# Patient Record
Sex: Female | Born: 1965 | Race: Black or African American | Hispanic: No | Marital: Single | Smoking: Former smoker
Health system: Southern US, Community
[De-identification: ages and names within clinical notes are randomized; demographics above are authoritative.]

## PROBLEM LIST (undated history)

## (undated) DIAGNOSIS — E785 Hyperlipidemia, unspecified: Secondary | ICD-10-CM

## (undated) DIAGNOSIS — K219 Gastro-esophageal reflux disease without esophagitis: Secondary | ICD-10-CM

## (undated) DIAGNOSIS — N189 Chronic kidney disease, unspecified: Secondary | ICD-10-CM

## (undated) DIAGNOSIS — J309 Allergic rhinitis, unspecified: Secondary | ICD-10-CM

## (undated) DIAGNOSIS — D649 Anemia, unspecified: Secondary | ICD-10-CM

## (undated) DIAGNOSIS — J45909 Unspecified asthma, uncomplicated: Secondary | ICD-10-CM

## (undated) DIAGNOSIS — E049 Nontoxic goiter, unspecified: Secondary | ICD-10-CM

## (undated) DIAGNOSIS — I10 Essential (primary) hypertension: Secondary | ICD-10-CM

## (undated) DIAGNOSIS — D869 Sarcoidosis, unspecified: Secondary | ICD-10-CM

## (undated) DIAGNOSIS — F419 Anxiety disorder, unspecified: Secondary | ICD-10-CM

## (undated) DIAGNOSIS — F329 Major depressive disorder, single episode, unspecified: Secondary | ICD-10-CM

## (undated) DIAGNOSIS — M1990 Unspecified osteoarthritis, unspecified site: Secondary | ICD-10-CM

## (undated) DIAGNOSIS — I639 Cerebral infarction, unspecified: Secondary | ICD-10-CM

## (undated) DIAGNOSIS — G4733 Obstructive sleep apnea (adult) (pediatric): Secondary | ICD-10-CM

## (undated) DIAGNOSIS — F32A Depression, unspecified: Secondary | ICD-10-CM

## (undated) HISTORY — DX: Unspecified asthma, uncomplicated: J45.909

## (undated) HISTORY — DX: Anxiety disorder, unspecified: F41.9

## (undated) HISTORY — DX: Allergic rhinitis, unspecified: J30.9

## (undated) HISTORY — PX: ABDOMINAL HYSTERECTOMY: SHX81

## (undated) HISTORY — DX: Cerebral infarction, unspecified: I63.9

## (undated) HISTORY — DX: Sarcoidosis, unspecified: D86.9

## (undated) HISTORY — DX: Nontoxic goiter, unspecified: E04.9

## (undated) HISTORY — DX: Depression, unspecified: F32.A

## (undated) HISTORY — DX: Obstructive sleep apnea (adult) (pediatric): G47.33

## (undated) HISTORY — DX: Hyperlipidemia, unspecified: E78.5

## (undated) HISTORY — PX: OVARIAN CYST REMOVAL: SHX89

## (undated) HISTORY — DX: Essential (primary) hypertension: I10

## (undated) HISTORY — DX: Major depressive disorder, single episode, unspecified: F32.9

---

## 1999-04-13 ENCOUNTER — Other Ambulatory Visit: Admission: RE | Admit: 1999-04-13 | Discharge: 1999-04-13 | Payer: Self-pay | Admitting: *Deleted

## 2000-03-11 ENCOUNTER — Other Ambulatory Visit: Admission: RE | Admit: 2000-03-11 | Discharge: 2000-03-11 | Payer: Self-pay | Admitting: *Deleted

## 2000-03-11 ENCOUNTER — Encounter (INDEPENDENT_AMBULATORY_CARE_PROVIDER_SITE_OTHER): Payer: Self-pay

## 2000-04-27 ENCOUNTER — Other Ambulatory Visit: Admission: RE | Admit: 2000-04-27 | Discharge: 2000-04-27 | Payer: Self-pay | Admitting: *Deleted

## 2001-04-12 ENCOUNTER — Encounter: Payer: Self-pay | Admitting: *Deleted

## 2001-04-12 ENCOUNTER — Ambulatory Visit (HOSPITAL_COMMUNITY): Admission: RE | Admit: 2001-04-12 | Discharge: 2001-04-12 | Payer: Self-pay | Admitting: *Deleted

## 2001-05-01 ENCOUNTER — Other Ambulatory Visit: Admission: RE | Admit: 2001-05-01 | Discharge: 2001-05-01 | Payer: Self-pay | Admitting: *Deleted

## 2002-05-15 ENCOUNTER — Other Ambulatory Visit: Admission: RE | Admit: 2002-05-15 | Discharge: 2002-05-15 | Payer: Self-pay | Admitting: *Deleted

## 2003-06-05 ENCOUNTER — Other Ambulatory Visit: Admission: RE | Admit: 2003-06-05 | Discharge: 2003-06-05 | Payer: Self-pay | Admitting: *Deleted

## 2003-07-22 ENCOUNTER — Other Ambulatory Visit: Admission: RE | Admit: 2003-07-22 | Discharge: 2003-07-22 | Payer: Self-pay | Admitting: *Deleted

## 2004-01-25 ENCOUNTER — Other Ambulatory Visit: Admission: RE | Admit: 2004-01-25 | Discharge: 2004-01-25 | Payer: Self-pay | Admitting: *Deleted

## 2004-06-09 ENCOUNTER — Other Ambulatory Visit: Admission: RE | Admit: 2004-06-09 | Discharge: 2004-06-09 | Payer: Self-pay | Admitting: *Deleted

## 2005-01-14 ENCOUNTER — Other Ambulatory Visit: Admission: RE | Admit: 2005-01-14 | Discharge: 2005-01-14 | Payer: Self-pay | Admitting: *Deleted

## 2005-05-06 ENCOUNTER — Encounter: Admission: RE | Admit: 2005-05-06 | Discharge: 2005-05-06 | Payer: Self-pay | Admitting: Emergency Medicine

## 2005-05-17 ENCOUNTER — Other Ambulatory Visit: Admission: RE | Admit: 2005-05-17 | Discharge: 2005-05-17 | Payer: Self-pay | Admitting: *Deleted

## 2005-05-27 ENCOUNTER — Ambulatory Visit: Payer: Self-pay | Admitting: Pulmonary Disease

## 2005-06-10 ENCOUNTER — Ambulatory Visit: Admission: RE | Admit: 2005-06-10 | Discharge: 2005-06-10 | Payer: Self-pay | Admitting: Pulmonary Disease

## 2005-06-10 ENCOUNTER — Ambulatory Visit: Payer: Self-pay | Admitting: Pulmonary Disease

## 2005-06-15 ENCOUNTER — Ambulatory Visit (HOSPITAL_BASED_OUTPATIENT_CLINIC_OR_DEPARTMENT_OTHER): Admission: RE | Admit: 2005-06-15 | Discharge: 2005-06-15 | Payer: Self-pay | Admitting: Pulmonary Disease

## 2005-06-15 ENCOUNTER — Ambulatory Visit: Payer: Self-pay | Admitting: Pulmonary Disease

## 2005-07-23 ENCOUNTER — Encounter: Admission: RE | Admit: 2005-07-23 | Discharge: 2005-07-23 | Payer: Self-pay | Admitting: Rheumatology

## 2005-09-08 ENCOUNTER — Observation Stay (HOSPITAL_COMMUNITY): Admission: RE | Admit: 2005-09-08 | Discharge: 2005-09-09 | Payer: Self-pay | Admitting: *Deleted

## 2005-09-08 ENCOUNTER — Encounter (INDEPENDENT_AMBULATORY_CARE_PROVIDER_SITE_OTHER): Payer: Self-pay | Admitting: Specialist

## 2005-09-27 ENCOUNTER — Encounter: Admission: RE | Admit: 2005-09-27 | Discharge: 2005-09-27 | Payer: Self-pay | Admitting: Rheumatology

## 2005-10-21 ENCOUNTER — Ambulatory Visit (HOSPITAL_COMMUNITY): Admission: RE | Admit: 2005-10-21 | Discharge: 2005-10-21 | Payer: Self-pay | Admitting: Rheumatology

## 2005-12-07 ENCOUNTER — Ambulatory Visit: Payer: Self-pay | Admitting: Endocrinology

## 2005-12-13 ENCOUNTER — Encounter (INDEPENDENT_AMBULATORY_CARE_PROVIDER_SITE_OTHER): Payer: Self-pay | Admitting: *Deleted

## 2005-12-13 ENCOUNTER — Other Ambulatory Visit: Admission: RE | Admit: 2005-12-13 | Discharge: 2005-12-13 | Payer: Self-pay | Admitting: Interventional Radiology

## 2005-12-13 ENCOUNTER — Encounter: Admission: RE | Admit: 2005-12-13 | Discharge: 2005-12-13 | Payer: Self-pay | Admitting: Endocrinology

## 2006-05-19 ENCOUNTER — Other Ambulatory Visit: Admission: RE | Admit: 2006-05-19 | Discharge: 2006-05-19 | Payer: Self-pay | Admitting: *Deleted

## 2007-05-02 ENCOUNTER — Ambulatory Visit: Payer: Self-pay | Admitting: Pulmonary Disease

## 2007-05-19 ENCOUNTER — Ambulatory Visit: Payer: Self-pay | Admitting: Pulmonary Disease

## 2007-05-19 ENCOUNTER — Ambulatory Visit (HOSPITAL_BASED_OUTPATIENT_CLINIC_OR_DEPARTMENT_OTHER): Admission: RE | Admit: 2007-05-19 | Discharge: 2007-05-19 | Payer: Self-pay | Admitting: Pulmonary Disease

## 2007-06-07 ENCOUNTER — Other Ambulatory Visit: Admission: RE | Admit: 2007-06-07 | Discharge: 2007-06-07 | Payer: Self-pay | Admitting: *Deleted

## 2007-06-27 ENCOUNTER — Ambulatory Visit: Payer: Self-pay | Admitting: Pulmonary Disease

## 2007-07-20 ENCOUNTER — Encounter: Payer: Self-pay | Admitting: *Deleted

## 2007-07-20 DIAGNOSIS — E785 Hyperlipidemia, unspecified: Secondary | ICD-10-CM | POA: Insufficient documentation

## 2007-07-20 DIAGNOSIS — F329 Major depressive disorder, single episode, unspecified: Secondary | ICD-10-CM | POA: Insufficient documentation

## 2007-07-20 DIAGNOSIS — D860 Sarcoidosis of lung: Secondary | ICD-10-CM | POA: Insufficient documentation

## 2007-07-20 DIAGNOSIS — J309 Allergic rhinitis, unspecified: Secondary | ICD-10-CM | POA: Insufficient documentation

## 2007-07-20 DIAGNOSIS — F411 Generalized anxiety disorder: Secondary | ICD-10-CM | POA: Insufficient documentation

## 2007-07-20 DIAGNOSIS — F3289 Other specified depressive episodes: Secondary | ICD-10-CM | POA: Insufficient documentation

## 2007-07-20 DIAGNOSIS — I10 Essential (primary) hypertension: Secondary | ICD-10-CM | POA: Insufficient documentation

## 2007-11-22 ENCOUNTER — Encounter: Payer: Self-pay | Admitting: Pulmonary Disease

## 2008-01-10 ENCOUNTER — Ambulatory Visit: Payer: Self-pay | Admitting: Pulmonary Disease

## 2008-01-10 DIAGNOSIS — G4733 Obstructive sleep apnea (adult) (pediatric): Secondary | ICD-10-CM | POA: Insufficient documentation

## 2008-07-22 ENCOUNTER — Other Ambulatory Visit: Admission: RE | Admit: 2008-07-22 | Discharge: 2008-07-22 | Payer: Self-pay | Admitting: Emergency Medicine

## 2009-06-06 ENCOUNTER — Ambulatory Visit: Payer: Self-pay | Admitting: Pulmonary Disease

## 2009-06-11 ENCOUNTER — Ambulatory Visit: Payer: Self-pay | Admitting: Internal Medicine

## 2009-06-19 ENCOUNTER — Ambulatory Visit: Payer: Self-pay | Admitting: Pulmonary Disease

## 2009-06-23 ENCOUNTER — Encounter: Payer: Self-pay | Admitting: Pulmonary Disease

## 2009-06-24 ENCOUNTER — Ambulatory Visit: Payer: Self-pay | Admitting: Pulmonary Disease

## 2009-06-24 DIAGNOSIS — E049 Nontoxic goiter, unspecified: Secondary | ICD-10-CM | POA: Insufficient documentation

## 2009-06-24 DIAGNOSIS — R0602 Shortness of breath: Secondary | ICD-10-CM | POA: Insufficient documentation

## 2009-08-07 ENCOUNTER — Ambulatory Visit: Payer: Self-pay | Admitting: Pulmonary Disease

## 2009-10-15 ENCOUNTER — Encounter: Payer: Self-pay | Admitting: Pulmonary Disease

## 2009-11-19 ENCOUNTER — Encounter: Payer: Self-pay | Admitting: Pulmonary Disease

## 2010-03-13 ENCOUNTER — Encounter: Payer: Self-pay | Admitting: Pulmonary Disease

## 2010-03-18 ENCOUNTER — Ambulatory Visit (HOSPITAL_COMMUNITY): Admission: RE | Admit: 2010-03-18 | Discharge: 2010-03-18 | Payer: Self-pay | Admitting: Rheumatology

## 2010-07-28 ENCOUNTER — Encounter: Payer: Self-pay | Admitting: Pulmonary Disease

## 2010-11-10 NOTE — Letter (Signed)
Summary: Stacey Drain MD  Stacey Drain MD   Imported By: Sherian Rein 11/29/2009 11:22:00  _____________________________________________________________________  External Attachment:    Type:   Image     Comment:   External Document

## 2010-11-10 NOTE — Assessment & Plan Note (Signed)
Summary: 2wk rov   Copy to:  Stacey Drain Primary Provider/Referring Provider:  Lajean Manes  CC:  2 wk follow up after CT and PFTs.  Pt states cpap is doing fine, wears it every night for 8-12 hours, and mask is fitting fine and pressure is doing well.  Pt states breathing is doing better since started on xopenex.  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  She has been feeling better since she started using xopenex.  She has been using this about once per day.  Recent PFT's showed mild restriction, mild diffusion capacity, and truncation of inspiratory limb of the flow volume loop.      CT of Chest  Procedure date:  06/11/2009  Findings:      CT Chest Impression   1.  No acute findings in the chest.  2.  Borderline enlarged mediastinal and hilar lymph nodes are consistent with the given history of sarcoid.   CT Neck Impression:  1.   Enlarged thyroid gland with dominant mass on the left.  Ultrasound recommended for further delineation.  This causes slight impression upon left lateral aspect of the trachea which is displaced minimally toward the right.  2.  Epiglottis and aryepiglottic folds top normal to minimally prominent in size.  Significance/etiology indeterminate.  3.   Polypoid opacification inferior aspect maxillary sinuses greater on the right   Current Medications (verified): 1)  Hydrochlorothiazide 12.5 Mg Caps (Hydrochlorothiazide) .Marland Kitchen.. 1 By Mouth Daily 2)  Zocor 40 Mg  Tabs (Simvastatin) .... Take 1 By Mouth Qd 3)  Lexapro 10 Mg  Tabs (Escitalopram Oxalate) .... Take 1 By Mouth Qd 4)  Wellbutrin Xl 300 Mg  Tb24 (Bupropion Hcl) .... Take 1 By Mouth Qd 5)  Methotrexate 2.5 Mg  Tabs (Methotrexate Sodium) .... 2 Times Weeklyu On Tues and Weds Weekly 6)  Cvs Folic Acid 400 Mcg  Tabs (Folic Acid) .... Once Daily 7)  Ferro-Bob 325 (65 Fe) Mg  Tabs (Ferrous Sulfate) ....  Two Times A Day 8)  Acid Adophilus .... 2 Tabs Daily 9)  Xopenex Hfa 45 Mcg/act Aero (Levalbuterol Tartrate) .... Two Puffs Up To Four Times Per Day As Needed  Allergies (verified): 1)  ! Ibuprofen  Past History:  Past Medical History: Allergic rhinitis Anxiety Depression Hyperlipidemia Hypertension Sarcoidosis (pulmonary and dermatologic) OSA      - PSG 06/15/05 AHI 28      - CPAP 11 cm H2O Dyspnea with probable asthma      - PFT 06/19/09 FEV1 1.78(62%), FVC 2.51(68%), FEV1% 71, TLC 4.03(74%), DLCO 65%, no BD  Past Surgical History: Reviewed history from 07/20/2007 and no changes required. Hysterectomy  Family History: Reviewed history from 06/06/2009 and no changes required. Mother - heart disease Brother - Sarcoid Niece - Sarcoid  Social History: Reviewed history from 06/06/2009 and no changes required. Single.  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  Vital Signs:  Patient profile:   45 year old female Height:      66 inches Weight:      303.38 pounds BMI:     49.14 O2 Sat:      97 % on Room air Temp:     98.4 degrees F oral Pulse rate:   81 / minute BP sitting:   116 / 70  (left arm) Cuff size:   large  Vitals Entered By: Gweneth Dimitri RN (June 24, 2009 2:40 PM)  O2 Flow:  Room air CC: 2 wk follow up after CT and PFTs.  Pt states cpap is doing fine, wears it every night for 8-12 hours, mask is fitting fine and pressure is doing well.  Pt states breathing is doing better since started on xopenex.   Comments Medications reviewed with patient Gweneth Dimitri RN  June 24, 2009 2:39 PM    Physical Exam  General:  obese. Nose:  nodule left alar region Mouth:  MP 3, no oral lesions Neck:  JVD and enlarged thyroid.   Lungs:  diminished breath sounds, no wheeze, no rales Heart:  regular rate and rhythm, S1, S2 without murmurs, rubs, gallops, or clicks Abdomen:  obese, soft, non-tender Extremities:  no edema Cervical Nodes:  no significant  adenopathy   Impression & Recommendations:  Problem # 1:  DYSPNEA (ICD-786.05) Her current symptoms are more suggestive of asthma, and she did have a clinical response to bronchodilator therapy.  I will start her on Qvar and continue as needed xopenex.  Her CT chest was relatively unremarkable for active sarcoid.  In addition, I think her PFT findings of mild restriction and diffusion defect could also be explained by her obesity.  She likely has a component of deconditioning.  Problem # 2:  SLEEP APNEA, OBSTRUCTIVE (ICD-327.23) She is to continue on CPAP.  Problem # 3:  SARCOIDOSIS (ICD-135) She is to continue on methotrexate.  I don't think she has active pulmonary involvement from her sarcoid.  Problem # 4:  ALLERGIC RHINITIS (ICD-477.9) She is to continue on symptomatic therapy.  Problem # 5:  GOITER, UNSPECIFIED (ICD-240.9) She has a history of goiter.  She did have some mild truncation of the inspiratory limb of her flow volume curve on recent PFT.  In addition she had tracheal deviation on recent CT neck.  She would like to see if she continues to improve with asthma therapy first.  If she remains symptomatic with dyspnea, then she may need ENT evaluation of her thyroid.  Medications Added to Medication List This Visit: 1)  Qvar 40 Mcg/act Aers (Beclomethasone dipropionate) .... Two puffs two times a day  Complete Medication List: 1)  Xopenex Hfa 45 Mcg/act Aero (Levalbuterol tartrate) .... Two puffs up to four times per day as needed 2)  Hydrochlorothiazide 12.5 Mg Caps (Hydrochlorothiazide) .Marland Kitchen.. 1 by mouth daily 3)  Zocor 40 Mg Tabs (Simvastatin) .... Take 1 by mouth qd 4)  Lexapro 10 Mg Tabs (Escitalopram oxalate) .... Take 1 by mouth qd 5)  Wellbutrin Xl 300 Mg Tb24 (Bupropion hcl) .... Take 1 by mouth qd 6)  Methotrexate 2.5 Mg Tabs (Methotrexate sodium) .... 2 times weeklyu on tues and weds weekly 7)  Cvs Folic Acid 400 Mcg Tabs (Folic acid) .... Once daily 8)   Ferro-bob 325 (65 Fe) Mg Tabs (Ferrous sulfate) .... Two times a day 9)  Acid Adophilus  .... 2 tabs daily 10)  Qvar 40 Mcg/act Aers (Beclomethasone dipropionate) .... Two puffs two times a day  Other Orders: Est. Patient Level III (22025)  Patient Instructions: 1)  Qvar two puffs two times a day, and rinse mouth after using 2)  Continue xopenex two puffs as needed 3)  Follow up in 3 to 4 weeks Prescriptions: QVAR 40 MCG/ACT AERS (BECLOMETHASONE DIPROPIONATE) two puffs two times a day  #1 x 3   Entered and Authorized by:   Coralyn Helling MD   Signed by:   Coralyn Helling MD on 06/24/2009  Method used:   Electronically to        [facility] [address] [phone]* (retail)       [address]       [place], [place]  [address]       Ph: [phone] or [phone]       Fax: [phone]   RxID:   [id number]

## 2010-11-10 NOTE — Assessment & Plan Note (Signed)
Summary: fu////kp   Referred by:  Stacey Drain PCP:  Lajean Manes  Chief Complaint:  follow up - no complaints.  History of Present Illness: I saw [name] in follow up for her moderate OSA on CPAP 7 and Sarcoidosis.  She is treated by Dr. Kellie Simmering for her sarcoid.  She uses a full face mask and humidifer for her CPAP.  She does have dryness in her nose and mouth.  She has not adjusted the temperature setting on her humidifer.  She goes to bed at 1030 and wakes up at 630.  She does not need to use the bathroom as much at night now.  She feels like she is getting more sleepy during the day compared to when she first started using CPAP.      Current Allergies: ! IBUPROFEN      Vital Signs:  Patient Profile:   45 Years Old Female Height:     66 inches Weight:      274.38 pounds BMI:     44.45 O2 Sat:      100 % O2 treatment:    Room Air Temp:     98.8 degrees F oral Pulse rate:   78 / minute BP sitting:   140 / 80  (right arm) Cuff size:   regular  Pt. in pain?   no  Vitals Entered By: Marinus Maw (January 10, 2008 4:10 PM)              Comments Medications reviewed with patient ..................................................................Marland KitchenMarinus Maw  January 10, 2008 4:10 PM       Physical Exam  Nose:     no sinus tenderness Mouth:     no oral lesion Lungs:     no wheeze Heart:     regular rhythm and normal rate.   Abdomen:     soft Extremities:     no edema     Impression & Recommendations:  Problem # 1:  SLEEP APNEA, OBSTRUCTIVE (ICD-327.23) I will arrange for her to have an auto-CPAP titration for two weeks to see if any further adjustments needed to be made in her CPAP pressure.  Problem # 2:  SARCOIDOSIS (ICD-135) She is followed by Dr. Kellie Simmering and dermatology for this.   Medications Added to Medication List This Visit: 1)  Cvs Folic Acid 400 Mcg Tabs (Folic acid) .... Once daily 2)  Ferro-bob 325 (65 Fe) Mg Tabs  (Ferrous sulfate) .... Two times a day 3)  Gnp Flax Seed Oil 1000 Mg Caps (Flaxseed (linseed)) .... Once daily 4)  Antioxidant A/c/e Tabs (Multiple vitamin) .... Qd 5)  Methotrexate 2.5 Mg Tabs (Methotrexate sodium) .... 2 times weeklyu on tues and weds weekly 6)  Acid Adophilus  .... 2 tabs daily   Patient Instructions: 1)  Will arrange for sleep test at home (auto-CPAP titration) 2)  Follow up in 6 months    ]  Appended Document: fu////kp      Referred by:  Stacey Drain PCP:  Lajean Manes   History of Present Illness: I reviewed her auto CPAP report from 01/16/08 to 01/29/08.  She had 86% of days with machine use, and an average of 7 hrs 22 min.  Her optimal pressure was 11 cm H2O with an AHI of 3.1.    Current Allergies: ! IBUPROFEN        Impression & Recommendations:  Problem # 1:  SLEEP APNEA, OBSTRUCTIVE (ICD-327.23) Will change CPAP pressure to 11 cm H2O. Orders:  DME Referral (DME)      ]

## 2010-11-10 NOTE — Assessment & Plan Note (Signed)
Summary: rov/apc   Copy to:  Stacey Drain Primary Provider/Referring Provider:  Lajean Manes  CC:  OSA follow-up.   Pt says she is doing well on CPAP.Marland Kitchen  History of Present Illness: 45 yo female with Sarcoidosis with pulmonary and skin involvement, and moderate OSA on CPAP 11 cm.  She has been feeling more short of breath over the past 2 weeks.  She has been getting a cough with clear sputum.  She has been getting chest tightness and wheezing.  Her sinuses have been ok.  She has been using her sinus regimen.  She denies fever, sweats, chest pain, abdominal pain, leg swelling, or gland swelling.  She has been doing well with CPAP.  She is using a full face mask, and has no problem with her mask.    Chest xray today shows mild hilar prominence, but otherwise unremarkable.  Spirometry today was normal, but did show some truncation in the flow volume loop.    Current Medications (verified): 1)  Hydrochlorothiazide 12.5 Mg Caps (Hydrochlorothiazide) .Marland Kitchen.. 1 By Mouth Daily 2)  Lexapro 10 Mg  Tabs (Escitalopram Oxalate) .... Take 1 By Mouth Qd 3)  Wellbutrin Xl 300 Mg  Tb24 (Bupropion Hcl) .... Take 1 By Mouth Qd 4)  Zocor 40 Mg  Tabs (Simvastatin) .... Take 1 By Mouth Qd 5)  Cvs Folic Acid 400 Mcg  Tabs (Folic Acid) .... Once Daily 6)  Ferro-Bob 325 (65 Fe) Mg  Tabs (Ferrous Sulfate) .... Two Times A Day 7)  Methotrexate 2.5 Mg  Tabs (Methotrexate Sodium) .... 2 Times Weeklyu On Tues and Weds Weekly 8)  Acid Adophilus .... 2 Tabs Daily  Allergies (verified): 1)  ! Ibuprofen  Past History:  Past Medical History: Allergic rhinitis Anxiety Depression Hyperlipidemia Hypertension Sarcoidosis (pulmonary and dermatologic) OSA      - PSG 06/15/05 AHI 28      - CPAP 11 cm H2O  Family History: Mother - heart disease Brother - Sarcoid Niece - Sarcoid  Social History: Single.  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  Vital Signs:  Patient profile:   45 year old  female Height:      66 inches (167.64 cm) Weight:      298.50 pounds (135.68 kg) BMI:     48.35 O2 Sat:      96 % on Room air Temp:     98.6 degrees F (37.00 degrees C) oral Pulse rate:   108 / minute BP sitting:   126 / 70  (right arm) Cuff size:   large  Vitals Entered By: Michel Bickers CMA (June 06, 2009 3:08 PM)  O2 Flow:  Room air  Physical Exam  General:  obese. Eyes:  PERRLA and EOMI.   Nose:  nodule left alar region Mouth:  MP 3, no oral lesions Neck:  no JVD.  no JVD.   Lungs:  diminished breath sounds, faint expiratory wheeze, no rales Heart:  regular rate and rhythm, S1, S2 without murmurs, rubs, gallops, or clicks Abdomen:  obese, soft, non-tender Extremities:  no edema Cervical Nodes:  no significant adenopathy   Impression & Recommendations:  Problem # 1:  SARCOIDOSIS (ICD-135) She has recent worsening of her respiratory symptoms.  Her chest xray today was relatively unremarkable, but did show mild hilar prominence.  To further assess this I will set her up for a CT chest and neck with contrast.  I will also set up full PFT's.  I will give her an empiric trial  of xopenex.  Problem # 2:  SLEEP APNEA, OBSTRUCTIVE (ICD-327.23) She is doing well on CPAP.  Medications Added to Medication List This Visit: 1)  Hydrochlorothiazide 12.5 Mg Caps (Hydrochlorothiazide) .Marland Kitchen.. 1 by mouth daily 2)  Xopenex Hfa 45 Mcg/act Aero (Levalbuterol tartrate) .... Two puffs up to four times per day as needed  Complete Medication List: 1)  Hydrochlorothiazide 12.5 Mg Caps (Hydrochlorothiazide) .Marland Kitchen.. 1 by mouth daily 2)  Zocor 40 Mg Tabs (Simvastatin) .... Take 1 by mouth qd 3)  Lexapro 10 Mg Tabs (Escitalopram oxalate) .... Take 1 by mouth qd 4)  Wellbutrin Xl 300 Mg Tb24 (Bupropion hcl) .... Take 1 by mouth qd 5)  Methotrexate 2.5 Mg Tabs (Methotrexate sodium) .... 2 times weeklyu on tues and weds weekly 6)  Cvs Folic Acid 400 Mcg Tabs (Folic acid) .... Once daily 7)  Ferro-bob 325  (65 Fe) Mg Tabs (Ferrous sulfate) .... Two times a day 8)  Acid Adophilus  .... 2 tabs daily 9)  Xopenex Hfa 45 Mcg/act Aero (Levalbuterol tartrate) .... Two puffs up to four times per day as needed  Other Orders: Est. Patient Level III (16109) Spirometry w/Graph (94010) Full Pulmonary Function Test (PFT) Radiology Referral (Radiology) T-2 View CXR (71020TC)  Patient Instructions: 1)  Xopenex two puffs up to four times per day as needed 2)  Will schedule breathing test (PFT) 3)  Will schedule CT neck and chest 4)  Follow up in 2 weeks

## 2010-11-10 NOTE — Miscellaneous (Signed)
Summary: Cancelled appt   Clinical Lists Changes Pt cancelled appt w/ Dr. Craige Cotta on 11-22-07 and did not [facility]...................................................................Marland KitchenMichel Bickers [facility]  November 22, 2007 12:35 PM

## 2010-11-10 NOTE — Miscellaneous (Signed)
Summary: Orders Update pft charges  Clinical Lists Changes  Orders: Added new Service order of Carbon Monoxide diffusing w/capacity (94720) - Signed Added new Service order of Lung Volumes (94240) - Signed Added new Service order of Spirometry (Pre & Post) (94060) - Signed 

## 2010-11-10 NOTE — Miscellaneous (Signed)
Summary: Pulmonary function test   Pulmonary Function Test Date: 06/19/2009 Height (in.): 66 Gender: Female  Pre-Spirometry FVC    Value: 2.51 L/min   Pred: 3.68 L/min     % Pred: 68 % FEV1    Value: 1.78 L     Pred: 2.84 L     % Pred: 63 % FEV1/FVC  Value: 71 %     Pred: 76 %     % Pred: . % FEF 25-75  Value: 1.28 L/min   Pred: 3.19 L/min     % Pred: 40 %  Post-Spirometry FVC    Value: 2.48 L/min   Pred: 3.68 L/min     % Pred: 67 % FEV1    Value: 1.77 L     Pred: 2.84 L     % Pred: 62 % FEV1/FVC  Value: 72 %     Pred: 76 %     % Pred: . % FEF 25-75  Value: 1.28 L/min   Pred: 3.19 L/min     % Pred: 40 %  Lung Volumes TLC    Value: 4.03 L   % Pred: 79 % RV    Value: 1.48 L   % Pred: 79 % DLCO    Value: 20.8 %   % Pred: 65 % DLCO/VA  Value: 6.01  %   % Pred: 146 %  Comments: No obstruction.  No bronchodilator response.  Mild restriction.  Mild diffusion defect corrects for lung volumes.  Has truncation of inspiratory limb of flow volume loop.  Clinical Lists Changes  Observations: Added new observation of PFT COMMENTS: No obstruction.  No bronchodilator response.  Mild restriction.  Mild diffusion defect corrects for lung volumes.  Has truncation of inspiratory limb of flow volume loop. (06/19/2009 8:41) Added new observation of DLCO/VA%EXP: 146 % (06/19/2009 8:41) Added new observation of DLCO/VA: 6.01  % (06/19/2009 8:41) Added new observation of DLCO % EXPEC: 65 % (06/19/2009 8:41) Added new observation of DLCO: 20.8 % (06/19/2009 8:41) Added new observation of RV % EXPECT: 79 % (06/19/2009 8:41) Added new observation of RV: 1.48 L (06/19/2009 8:41) Added new observation of TLC % EXPECT: 79 % (06/19/2009 8:41) Added new observation of TLC: 4.03 L (06/19/2009 8:41) Added new observation of FEF2575%EXPS: 40 % (06/19/2009 8:41) Added new observation of PSTFEF25/75P: 3.19  (06/19/2009 8:41) Added new observation of PSTFEF25/75%: 1.28 L/min (06/19/2009 8:41) Added new  observation of PSTFEV1/FCV%: . % (06/19/2009 8:41) Added new observation of FEV1FVCPRDPS: 76 % (06/19/2009 8:41) Added new observation of PSTFEV1/FVC: 72 % (06/19/2009 8:41) Added new observation of POSTFEV1%PRD: 62 % (06/19/2009 8:41) Added new observation of FEV1PRDPST: 2.84 L (06/19/2009 8:41) Added new observation of POST FEV1: 1.77 L/min (06/19/2009 8:41) Added new observation of POST FVC%EXP: 67 % (06/19/2009 8:41) Added new observation of FVCPRDPST: 3.68 L/min (06/19/2009 8:41) Added new observation of POST FVC: 2.48 L (06/19/2009 8:41) Added new observation of FEF % EXPEC: 40 % (06/19/2009 8:41) Added new observation of FEF25-75%PRE: 3.19 L/min (06/19/2009 8:41) Added new observation of FEF 25-75%: 1.28 L/min (06/19/2009 8:41) Added new observation of FEV1/FVC%EXP: . % (06/19/2009 8:41) Added new observation of FEV1/FVC PRE: 76 % (06/19/2009 8:41) Added new observation of FEV1/FVC: 71 % (06/19/2009 8:41) Added new observation of FEV1 % EXP: 63 % (06/19/2009 8:41) Added new observation of FEV1 PREDICT: 2.84 L (06/19/2009 8:41) Added new observation of FEV1: 1.78 L (06/19/2009 8:41) Added new observation of FVC % EXPECT: 68 % (06/19/2009 8:41) Added new observation of  FVC PREDICT: 3.68 L (06/19/2009 8:41) Added new observation of FVC: 2.51 L (06/19/2009 8:41) Added new observation of PFT HEIGHT: 66  (06/19/2009 8:41) Added new observation of PFT DATE: 06/19/2009  (06/19/2009 8:41)

## 2010-11-10 NOTE — Letter (Signed)
Summary: Stacey Drain MD  Stacey Drain MD   Imported By: Sherian Rein 11/11/2009 09:13:50  _____________________________________________________________________  External Attachment:    Type:   Image     Comment:   External Document

## 2010-11-10 NOTE — Assessment & Plan Note (Signed)
Summary: 4 weeks/ mbw   Copy to:  Stacey Drain Primary Provider/Referring Provider:  Lajean Manes  CC:  3-4 week dyspnea follow-up.  Pt says her breathing is 95% better on the Qvar.Marland Kitchen  History of Present Illness: 46 yo female with Sarcoidosis with pulmonary and skin involvement, and moderate OSA on CPAP 11 cm.  Her breathing has improved with Qvar.  She still has an occasional dry cough.  However, she is still having some sinus congestion and post-nasal drip.  She denies wheeze or fever.  She has not been using her xopenex much.  Her methotrexate dose was decreased to 6 pills per week because her liver enzymes were elevated.  She also had a decrease in the dose of her zocor.  She is not having any problems with her CPAP.   Current Medications (verified): 1)  Xopenex Hfa 45 Mcg/act Aero (Levalbuterol Tartrate) .... Two Puffs Up To Four Times Per Day As Needed 2)  Hydrochlorothiazide 12.5 Mg Caps (Hydrochlorothiazide) .Marland Kitchen.. 1 By Mouth Daily 3)  Zocor 40 Mg  Tabs (Simvastatin) .... Take 1 By Mouth Qd 4)  Lexapro 10 Mg  Tabs (Escitalopram Oxalate) .... Take 1 By Mouth Qd 5)  Wellbutrin Xl 300 Mg  Tb24 (Bupropion Hcl) .... Take 1 By Mouth Qd 6)  Methotrexate 2.5 Mg  Tabs (Methotrexate Sodium) .... 2 Times Weeklyu On Tues and Weds Weekly 7)  Cvs Folic Acid 400 Mcg  Tabs (Folic Acid) .... Once Daily 8)  Ferro-Bob 325 (65 Fe) Mg  Tabs (Ferrous Sulfate) .... Two Times A Day 9)  Acid Adophilus .... 2 Tabs Daily 10)  Qvar 40 Mcg/act Aers (Beclomethasone Dipropionate) .... Two Puffs Two Times A Day 11)  Flonase 50 Mcg/act Susp (Fluticasone Propionate) .... 2 Sprays in Each Nostril Daily  Allergies (verified): 1)  ! Ibuprofen  Past History:  Past Medical History: Reviewed history from 06/24/2009 and no changes required. Allergic rhinitis Anxiety Depression Hyperlipidemia Hypertension Sarcoidosis (pulmonary and dermatologic) OSA      - PSG 06/15/05 AHI 28      - CPAP 11 cm H2O Dyspnea  with probable asthma      - PFT 06/19/09 FEV1 1.78(62%), FVC 2.51(68%), FEV1% 71, TLC 4.03(74%), DLCO 65%, no BD  Past Surgical History: Reviewed history from 07/20/2007 and no changes required. Hysterectomy  Family History: Reviewed history from 06/06/2009 and no changes required. Mother - heart disease Brother - Sarcoid Niece - Sarcoid  Social History: Reviewed history from 06/06/2009 and no changes required. Single.  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  Vital Signs:  Patient profile:   45 year old female Height:      66 inches (167.64 cm) Weight:      291.50 pounds (132.50 kg) BMI:     47.22 O2 Sat:      97 % on Room air Temp:     98.5 degrees F (36.94 degrees C) oral Pulse rate:   84 / minute BP sitting:   124 / 78  (right arm) Cuff size:   large  Vitals Entered By: Michel Bickers CMA (August 07, 2009 3:01 PM)  O2 Flow:  Room air  Physical Exam  General:  obese. Nose:  nodule left alar region with some improvement  Mouth:  MP 3, no oral lesions Neck:  JVD and enlarged thyroid.   Lungs:  diminished breath sounds, no wheeze, no rales Heart:  regular rate and rhythm, S1, S2 without murmurs, rubs, gallops, or clicks Abdomen:  obese, soft,  non-tender Extremities:  no edema Cervical Nodes:  no significant adenopathy   Impression & Recommendations:  Problem # 1:  DYSPNEA (ICD-786.05) Her symptoms have improved with inhaler therapy for asthma.  Of note is that her symptoms did not get worse after her MTX dose was decreased which might be expected if her dyspnea was related to her sarcoidosis.    Will begin to taper her dose of Qvar as tolerated.  Problem # 2:  SARCOIDOSIS (ICD-135) Her pulmonary symptoms have improved with inhaler therapy.  I don't think her pulmonary sarcoid is causing symptoms at present.  She is on MTX per rheumatology.  Problem # 3:  SLEEP APNEA, OBSTRUCTIVE (ICD-327.23) No change to current set up.  Problem # 4:  GOITER, UNSPECIFIED  (ICD-240.9) Will continue clinical monitoring.  Medications Added to Medication List This Visit: 1)  Qvar 40 Mcg/act Aers (Beclomethasone dipropionate) .... One puff in the morning and two puffs at night for two weeks, then one puff two times a day 2)  Flonase 50 Mcg/act Susp (Fluticasone propionate) .... 2 sprays in each nostril daily 3)  Methotrexate 2.5 Mg Tabs (Methotrexate sodium) .... 6 pills per week  Complete Medication List: 1)  Qvar 40 Mcg/act Aers (Beclomethasone dipropionate) .... One puff in the morning and two puffs at night for two weeks, then one puff two times a day 2)  Xopenex Hfa 45 Mcg/act Aero (Levalbuterol tartrate) .... Two puffs up to four times per day as needed 3)  Flonase 50 Mcg/act Susp (Fluticasone propionate) .... 2 sprays in each nostril daily 4)  Hydrochlorothiazide 12.5 Mg Caps (Hydrochlorothiazide) .Marland Kitchen.. 1 by mouth daily 5)  Zocor 40 Mg Tabs (Simvastatin) .... Take 1 by mouth qd 6)  Lexapro 10 Mg Tabs (Escitalopram oxalate) .... Take 1 by mouth qd 7)  Wellbutrin Xl 300 Mg Tb24 (Bupropion hcl) .... Take 1 by mouth qd 8)  Methotrexate 2.5 Mg Tabs (Methotrexate sodium) .... 6 pills per week 9)  Cvs Folic Acid 400 Mcg Tabs (Folic acid) .... Once daily 10)  Ferro-bob 325 (65 Fe) Mg Tabs (Ferrous sulfate) .... Two times a day 11)  Acid Adophilus  .... 2 tabs daily  Other Orders: Est. Patient Level III (14782)  Patient Instructions: 1)  Qvar one puff in the morning and two puffs at night for two weeks, and if okay then one puff two times a day 2)  Follow up in 3 to 4 months

## 2010-11-13 NOTE — Letter (Signed)
Summary: Stacey Drain MD  Stacey Drain MD   Imported By: Sherian Rein 08/12/2010 08:55:04  _____________________________________________________________________  External Attachment:    Type:   Image     Comment:   External Document

## 2010-11-13 NOTE — Letter (Signed)
Summary: Stacey Drain MD  Stacey Drain MD   Imported By: Sherian Rein 04/01/2010 09:08:13  _____________________________________________________________________  External Attachment:    Type:   Image     Comment:   External Document

## 2010-11-24 ENCOUNTER — Other Ambulatory Visit (HOSPITAL_COMMUNITY): Payer: Self-pay | Admitting: Otolaryngology

## 2010-12-01 ENCOUNTER — Ambulatory Visit (HOSPITAL_COMMUNITY)
Admission: RE | Admit: 2010-12-01 | Discharge: 2010-12-01 | Disposition: A | Discharge status: Home / Self Care | Payer: 59 | Source: Ambulatory Visit | Attending: Otolaryngology | Admitting: Otolaryngology

## 2010-12-01 DIAGNOSIS — R1310 Dysphagia, unspecified: Secondary | ICD-10-CM | POA: Insufficient documentation

## 2010-12-09 ENCOUNTER — Encounter: Payer: Self-pay | Admitting: Pulmonary Disease

## 2010-12-09 ENCOUNTER — Ambulatory Visit (INDEPENDENT_AMBULATORY_CARE_PROVIDER_SITE_OTHER): Payer: 59 | Admitting: Pulmonary Disease

## 2010-12-09 DIAGNOSIS — D869 Sarcoidosis, unspecified: Secondary | ICD-10-CM

## 2010-12-09 DIAGNOSIS — R0602 Shortness of breath: Secondary | ICD-10-CM

## 2010-12-09 DIAGNOSIS — G4733 Obstructive sleep apnea (adult) (pediatric): Secondary | ICD-10-CM

## 2010-12-09 DIAGNOSIS — J309 Allergic rhinitis, unspecified: Secondary | ICD-10-CM

## 2010-12-17 NOTE — Assessment & Plan Note (Signed)
Summary: ROV//SH   Copy to:  Stacey Drain Primary Provider/Referring Provider:  Lajean Manes  CC:  Follow up. Pt states her breathing has been doing "good". Pt c/o cough w/ green-yellow phlem and wheezing. Pt states she uses her cpap everynight x 8-12 hrs a night. Pt states she feels rested when using cpap and is having no problems with machine/mask.  History of Present Illness: 45 yo female with Sarcoidosis with pulmonary and skin involvement, asthma, and moderate OSA on CPAP 11 cm.  She has persistent cough with clear to yellow sputum.  She gets occasional wheeze.  She denies fever, chest pain, or hemoptysis.  She has sinus congestion, and is using flonase daily.  She is not using xopenex much, but this helps when she uses it.  She is doing well with CPAP.  She uses this every night.  This helps her sleep and energy.   Current Medications (verified): 1)  Qvar 40 Mcg/act Aers (Beclomethasone Dipropionate) .... 2 Puffs Two Times A Day 2)  Xopenex Hfa 45 Mcg/act Aero (Levalbuterol Tartrate) .... Two Puffs Up To Four Times Per Day As Needed 3)  Flonase 50 Mcg/act Susp (Fluticasone Propionate) .... 2 Sprays in Each Nostril Daily 4)  Triamterene-Hctz 37.5-25 Mg Tabs (Triamterene-Hctz) .... 1/2 Once Daily 5)  Zocor 20 Mg Tabs (Simvastatin) .... Once Daily 6)  Lexapro 10 Mg  Tabs (Escitalopram Oxalate) .... Take 1 By Mouth Qd 7)  Wellbutrin Xl 300 Mg  Tb24 (Bupropion Hcl) .... Take 1 By Mouth Qd 8)  Methotrexate 2.5 Mg  Tabs (Methotrexate Sodium) .... 5 Pills Per Week 9)  Cvs Folic Acid 400 Mcg  Tabs (Folic Acid) .... Once Daily 10)  Ferro-Bob 325 (65 Fe) Mg  Tabs (Ferrous Sulfate) .... Two Times A Day 11)  Acid Adophilus .... 2 Tabs Daily  Allergies (verified): 1)  ! Ibuprofen  Past History:  Past Medical History: Allergic rhinitis Anxiety Depression Hyperlipidemia Hypertension Sarcoidosis (pulmonary and dermatologic) OSA      - PSG 06/15/05 AHI 28      - CPAP 11 cm  H2O Dyspnea with probable asthma      - PFT 06/19/09 FEV1 1.78(62%), FVC 2.51(68%), FEV1% 71, TLC 4.03(74%), DLCO 65%, no BD Goiter  Past Surgical History: Reviewed history from 07/20/2007 and no changes required. Hysterectomy  Social History: Single.  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 1/2 ppd. started age 24  Vital Signs:  Patient profile:   45 year old female Height:      66 inches Weight:      296 pounds BMI:     47.95 O2 Sat:      99 % on Room air Temp:     98.6 degrees F oral Pulse rate:   85 / minute BP sitting:   134 / 92  (left arm) Cuff size:   large  Vitals Entered By: Carver Fila (December 09, 2010 11:56 AM)  O2 Flow:  Room air CC: Follow up. Pt states her breathing has been doing "good". Pt c/o cough w/ green-yellow phlem, wheezing. Pt states she uses her cpap everynight x 8-12 hrs a night. Pt states she feels rested when using cpap and is having no problems with machine/mask Comments meds and allergies updated Phone number updated Carver Fila  December 09, 2010 11:57 AM    Physical Exam  General:  obese. Nose:  nodule left alar region with some improvement  Mouth:  MP 3, no oral lesions Neck:  JVD and enlarged  thyroid.   Lungs:  diminished breath sounds, no wheeze, no rales Heart:  regular rate and rhythm, S1, S2 without murmurs, rubs, gallops, or clicks Extremities:  no edema Neurologic:  normal CN II-XII.   Cervical Nodes:  no significant adenopathy   Impression & Recommendations:  Problem # 1:  DYSPNEA (ICD-786.05)  Will have her try dulera for two weeks, and hold qvar during this time.  She is to call if she feels dulera has improved her symptoms, and then will refill this.  If no difference, then she should continue with Qvar.  Problem # 2:  SLEEP APNEA, OBSTRUCTIVE (ICD-327.23)  No change to current set up.  Problem # 3:  SARCOIDOSIS (ICD-135) She is on MTX per rheumatology.  Problem # 4:  ALLERGIC RHINITIS (ICD-477.9)  She is  to continue flonase  Medications Added to Medication List This Visit: 1)  Qvar 40 Mcg/act Aers (Beclomethasone dipropionate) .... 2 puffs two times a day 2)  Triamterene-hctz 37.5-25 Mg Tabs (Triamterene-hctz) .... 1/2 once daily 3)  Zocor 20 Mg Tabs (Simvastatin) .... Once daily 4)  Methotrexate 2.5 Mg Tabs (Methotrexate sodium) .... 5 pills per week 5)  Dulera 100-5 Mcg/act Aero (Mometasone furo-formoterol fum) .... Two puffs two times a day for two weeks  Complete Medication List: 1)  Qvar 40 Mcg/act Aers (Beclomethasone dipropionate) .... 2 puffs two times a day 2)  Xopenex Hfa 45 Mcg/act Aero (Levalbuterol tartrate) .... Two puffs up to four times per day as needed 3)  Flonase 50 Mcg/act Susp (Fluticasone propionate) .... 2 sprays in each nostril daily 4)  Triamterene-hctz 37.5-25 Mg Tabs (Triamterene-hctz) .... 1/2 once daily 5)  Zocor 20 Mg Tabs (Simvastatin) .... Once daily 6)  Lexapro 10 Mg Tabs (Escitalopram oxalate) .... Take 1 by mouth qd 7)  Wellbutrin Xl 300 Mg Tb24 (Bupropion hcl) .... Take 1 by mouth qd 8)  Methotrexate 2.5 Mg Tabs (Methotrexate sodium) .... 5 pills per week 9)  Cvs Folic Acid 400 Mcg Tabs (Folic acid) .... Once daily 10)  Ferro-bob 325 (65 Fe) Mg Tabs (Ferrous sulfate) .... Two times a day 11)  Acid Adophilus  .... 2 tabs daily 12)  Dulera 100-5 Mcg/act Aero (Mometasone furo-formoterol fum) .... Two puffs two times a day for two weeks  Other Orders: Est. Patient Level IV (81191)  Patient Instructions: 1)  Use dulera two puffs two times a day for two weeks.  Rinse mouth after using.  Call in two weeks to update status.  Do not use Qvar while using dulera. 2)  Follow up in 6 months   Immunization History:  Influenza Immunization History:    Influenza:  historical (09/10/2010)

## 2010-12-23 ENCOUNTER — Telehealth (INDEPENDENT_AMBULATORY_CARE_PROVIDER_SITE_OTHER): Payer: Self-pay | Admitting: *Deleted

## 2010-12-29 NOTE — Progress Notes (Signed)
Summary: med status updateFYI dulera works better than qvar  Phone Note Call from Patient Call back at [phone]   Caller: Patient Call For: sood Reason for Call: Talk to Nurse Summary of Call: FYI:  Patient was to call in two weeks to give status update on taking dulera.  Patient states it is woking well (better than qvar) and will need rx sent to [facility]. Initial call taken by: Lehman Prom,  December 23, 2010 12:58 PM  Follow-up for Phone Call        [email or web address] x1 Carver Fila  December 23, 2010 2:26 PM  [facility] X2 Carver Fila  December 24, 2010 9:12 AM   Called and spoke with pt and she states the [place] has helped her much better than the qvar. She states she can feel a difference in her breathing when using dulera. Pt wants rx sent to [facility] since she feels this medication has helped a lot. Will send to [facility] for FYI Carver Fila  December 24, 2010 5:35 PM   Additional Follow-up for Phone Call Additional follow up Details #1::        Great, thanks. Additional Follow-up by: Coralyn Helling MD,  December 24, 2010 5:36 PM    Prescriptions: DULERA 100-5 MCG/ACT AERO (MOMETASONE FURO-FORMOTEROL FUM) two puffs two times a day for two weeks  #3 x 3   Entered by:   Carver Fila   Authorized by:   Coralyn Helling MD   Signed by:   Carver Fila on 12/24/2010   Method used:   Faxed to ...       [facility] (mail-order)             , [place]         Ph: [phone]       Fax: [phone]   RxID:   [id number]

## 2011-02-23 NOTE — Assessment & Plan Note (Signed)
[facility]                             PULMONARY OFFICE NOTE   GLENDALE, WHERRY                         MRN:          [id number]  DATE:05/02/2007                            DOB:          07/31/1966    I saw Ms. Askin today for further evaluation of her sleep apnea.   I had originally seen her in August, 2006, at which time I had evaluated  her for pulmonary sarcoidosis as well as possible obstructive sleep  apnea.  She is currently under the care of Dr. Kellie Simmering for her  sarcoidosis and is on methotrexate for this and reports her symptoms  with regards to this are doing reasonably well.   With regards to her sleep apnea, she did have an overnight polysomnogram  done on June 15, 2005, which showed evidence for moderate  obstructive sleep apnea with an apnea-hypopnea index of 28 and an oxygen  saturation nadir of 93%.  Of note is that she had a minimal amount of  REM sleep and no supine sleep.   She says that she was not sure if she wanted to follow through with the  diagnosis after she had her sleep study, and as a result, did not keep  her follow-up appointment.  She says now, however, she has been having  persistent symptoms of sleep disruption and excessive daytime  sleepiness, and as a result, is interested in pursuing more therapy for  her sleep apnea.   Her current sleep pattern is that she goes to bed between 9:30 and 10:30  at night.  She falls asleep fairly quickly.  She wakes up 4-5 times a  night with coughing as well as having to use the bathroom.  She gets up  in the morning at 6:30 but still feels quite tired.  By the weekend, she  will actually sleep in until about 3:00 to 4:00 in the afternoon.  She  does snore.  She has been told that she stops breathing while she is  asleep.  She also has vivid dreams and tends to talk in her sleep.  She  will fall asleep fairly easily watching TV or reading but denies having  difficulty  falling asleep or driving.   Her past medical history is significant for pulmonary sarcoidosis,  hypertension, elevated cholesterol, allergies, and anxiety.   Past surgical history is significant for hysterectomy in 2006 for  uterine fibroids.   She has an allergy to IBUPROFEN.   CURRENT MEDICATIONS:  1. Hydrochlorothiazide 25 mg daily.  2. Lexapro 10 mg daily.  3. Wellbutrin 300 mg daily.  4. Methotrexate 25 mg weekly.  5. Folic acid 1 mg daily.  6. Zocor.  7. Flonase 2 sprays in each nostril once daily.  8. Iron supplement daily.  9. Multivitamin daily.  10.Flaxseed oil daily.  11.Acidophilus daily.   SOCIAL HISTORY:  She is single.  She works as a Diplomatic Services operational officer.  She quit  smoking approximately 10 years ago.  She quit drinking seven years ago.   Family history is significant for her mother with heart disease  and  brother and niece who have sarcoidosis.   PHYSICAL EXAMINATION:  She is 263 pounds.  Temperature is 98.4.  Blood  pressure is 106/78.  Heart rate 75.  Oxygen saturation 98% on room air.  HEENT:  Pupils are reactive.  Extraocular muscles are intact.  There is  no sinus tenderness.  No nasal discharge.  She has a Mallampati IV  airway.  NECK:  No lymphadenopathy.  No thyromegaly.  HEART:  S1 and S2.  CHEST:  There was no wheezing or rales.  ABDOMEN:  Obese.  Soft and nontender.  EXTREMITIES:  No clubbing, cyanosis or edema.  NEUROLOGIC:  No focal deficits were appreciated.   IMPRESSION:  1. Moderate obstructive sleep apnea, as demonstrated by an apnea-      hypopnea index of 28.  I had discussed the results of her sleep      study with her.  I had also discussed with her the importance of      diet, exercise, and weight reduction as well as avoidance of      alcohol and sedatives.  Driving precautions were reviewed with her      as well.  I had reviewed various treatment options for sleep apnea,      including CPAP therapy, oral appliance, and surgical  intervention.      She is agreeable to undergo CPAP therapy; therefore, I will make      arrangements for her to have a CPAP titration study.  2. Pulmonary sarcoidosis:  This appears to be reasonably stable at the      present time.  She is due to have followup with Dr. Kellie Simmering for      this.   I will follow up with her after I review her CPAP titration study.  I  will initiate her on CPAP prior to this.     Coralyn Helling, MD  Electronically Signed    VS/Med[facility]  DD: 05/02/2007  DT: 05/02/2007  Job #: [id number]   cc:   Oley Balm. Georgina Pillion, M.D.  Aundra Dubin, M.D.  Sean A. Everardo All, MD

## 2011-02-23 NOTE — Assessment & Plan Note (Signed)
[facility]                             PULMONARY OFFICE NOTE   ZYA, FINKLE                         MRN:          [id number]  DATE:06/27/2007                            DOB:          07-25-1966    I saw Ms. Hilgers in followup today for her moderate obstructive sleep  apnea.   Since last visit she had undergone a CPAP titration study on May 19, 2007 and she was titrated to a CPAP pressure setting of 7 cm of water  with a reduction of apnea-hypopnea index to 0.4.  At this pressure she  was observed in both REM sleep, supine sleep and snoring was limited.   Since then she has been started on CPAP at 7 cm of water.  She was tried  initially on nasal pillows but was not able to tolerate this.  She has  chosen to switch to a full face mask with heated humidification.  She  says that she goes to bed around 10:30 and has no trouble falling  asleep.  She sleeps through 'till about 2 in the morning and wakes up to  go to the bathroom.  She says however that when she comes back to sleep  when she puts the mask on she has trouble with mask leak and as a result  will usually end taking her mask off in the middle of the night.  She  wakes up at 6 in the morning.  She says that she does feel somewhat  better and has some increase in her energy level during the day but  still occasionally feels sleepy.  She does feel that when she uses her  CPAP mask longer she does feel better during the day.  She complains of  nasal congestion as well as mouth dryness but this apparently has been  present even before she was started on CPAP and has not gotten any  worse.   CURRENT MEDICATIONS:  1. Hydrochlorothiazide 25 mg daily.  2. Lexapro 10 mg daily.  3. Wellbutrin 300 mg daily.  4. Zocor.  5. Flonase 2 sprays daily.  6. Iron supplement daily.  7. Multivitamin.  8. Flax seed oil daily.   PHYSICAL EXAMINATION:  She is 254 pounds, temperature 98.4, blood  pressure is 122/82, heart rate is 82, oxygen saturation 99% on room air.  HEENT:  There was no sinus tenderness, she has some chronic changes from  her sarcoid over her nares.  There was no oral lesions, no  lymphadenopathy.  HEART:  With S1-S2.  CHEST:  There was no wheezing or rales.  ABDOMEN:  Obese, soft, nontender.  EXTREMITIES:  No edema.   IMPRESSION:  Moderate obstructive sleep apnea, currently doing  reasonably well on continuous positive airway pressure 7 cm of water.  I  have discussed various techniques to try and help her acclimatize to  continuous positive airway pressure therapy as well as try and get a  better fit for her mask.  I have also encouraged her to try and use the  continuous positive airway pressure machine  the entire time that she is  asleep.  I have also emphasized to her the importance of diet and  exercise and weight reduction.   I will follow up with her in approximately 4-6 months.  If she is still  having trouble at that time then it is possible that we may need to  further titrate her pressure setting.     Coralyn Helling, MD  Electronically Signed    VS/Med[facility]  DD: 06/27/2007  DT: 06/27/2007  Job #: [id number]   cc:   Oley Balm. Georgina Pillion, M.D.  Aundra Dubin, M.D.  Sean A. Everardo All, MD

## 2011-02-23 NOTE — Procedures (Signed)
NAMESTEPHANIE, [name] NO.:  [id number]   MEDICAL RECORD NO.:  [id number]          PATIENT TYPE:  OUT   LOCATION:  SLEEP CENTER                 FACILITY:  [facility]   PHYSICIAN:  Coralyn Helling, MD        DATE OF BIRTH:  07-Dec-1965   DATE OF STUDY:  05/19/2007                            NOCTURNAL POLYSOMNOGRAM   REFERRING PHYSICIAN:   SLEEP STUDY   INDICATION FOR STUDY:  This is an individual who had an overnight  polysomnogram on June 15, 2005 which showed moderate obstructive  sleep apnea with an apnea-hypopnea index of 28.3 and oxygen saturation  of 93%.  She returns to the sleep lab for a CPAP titration study.   EPWORTH SLEEPINESS SCORE:  Epworth score is 14.   MEDICATIONS:  Hydrochlorothiazide, Lexapro, Wellbutrin, methotrexate,  folic acid, Zocor, Flonase, iron, multivitamin, flaxseed oil, and  acidophilus.   SLEEP ARCHITECTURE:  Total recording time was 407 minutes.  Total sleep  time was 306 minutes.  Sleep efficiency was 75%.  Sleep latency was 47  minutes which was prolonged.  REM latency was 198 minutes which was  prolonged.  The patient was observed in all stages of sleep, but had a  reduction in the percentage of slow wave sleep to 4% of the study and  REM sleep to 12% of the study.  The patient slept in both the supine and  nonsupine positions.   RESPIRATORY DATA:  The average respiratory rate was 16.  The patient was  titrated from a CPAP pressure setting of 5 to 7 cm of water.  At a CPAP  pressure setting of 7 cm of water the apnea-hypopnea index was reduced  to 0.4.  At this pressure setting the patient was observed in both REM  sleep and supine sleep.  Snoring was eliminated, although she did still  appear to have some degree of respiratory event related arousals during  REM sleep.   OXYGEN DATA:  The baseline oxygenation was 100%.  The oxygen saturation  nadir was 88%.  At CPAP pressure setting of 7 cm of water the mean  oxygenation during  non-REM sleep was 96%, the mean oxygenation during  REM sleep was 95%, the minimal oxygenation during non-REM sleep was 88%,  and the minimal oxygenation during REM sleep was 88%.   CARDIAC DATA:  The average heart rate was 54.  The rhythm strip showed  normal sinus rhythm with sinus bradycardia.   MOVEMENT/PARASOMNIA:  The periodic limb movement index was 0.4.  The  patient had one restroom trip.   IMPRESSIONS:  This is a CPAP titration study.  At a CPAP setting of 7 cm  of water the apnea-hypopnea index was 0.4.  At this pressure setting the  patient was observed in REM sleep, supine sleep, and snoring was  eliminated.  However, she did appear to still have respiratory event  related to arousals during REM sleep.  What I would recommend is to  start her on CPAP at 7 cm of water and monitor her for her clinical  response.  If she is still having some degree of difficulty,  then her  pressure may need to be increased.      Coralyn Helling, MD  Diplomat, American Board of Sleep Medicine  Electronically Signed     VS/MEDQ  D:  05/24/2007 08:07:47  T:  05/25/2007 09:43:03  Job:  [id number]

## 2011-02-26 NOTE — H&P (Signed)
NAMEDELANDA, BULLUCK                  ACCOUNT NO.:  [id number]   MEDICAL RECORD NO.:  [id number]          PATIENT TYPE:  AMB   LOCATION:  SDC                           FACILITY:  [facility]   PHYSICIAN:  Almedia Balls. Fore, M.D.   DATE OF BIRTH:  1965/12/29   DATE OF ADMISSION:  09/08/2005  DATE OF DISCHARGE:                                HISTORY & PHYSICAL   CHIEF COMPLAINT:  Abdominal uterine bleeding, pelvic pain, uterine  enlargement.   HISTORY:  The patient is a 45 year old, gravida 0, who has been followed in  our office for the past nine years.  She has been placed on oral  contraceptives for menstrual control without great success.  She has had  fibroids since that time as well with increased pain and problems with  bleeding.  She underwent abdominal myomectomy, in November 1998, with benign  findings.  She has been followed since that time with gradual increase in  her uterus again to the point where it is now 10-12 weeks' gestational size  and irregular and very tender.  Oral contraceptives with hyper-gestational  balance have not been able to control her bleeding which has been heavy and  painful.  She now prefers definitive therapy and is admitted for  hysterectomy, possible bilateral salpingo-oophorectomy on September 08, 2005.  Pap smear has been normal within the past year.  She has been fully  counseled as to the nature of the procedure and the risks involved to  include risks of anesthesia, injury to bowel, bladder, blood vessels,  ureters, postoperative hemorrhage, infection, recuperation, possible hormone  replacement should her ovaries be removed.  She fully understands all these  considerations and wishes to proceed on September 08, 2005.   PAST MEDICAL HISTORY:  1.  Pulmonary sarcoidosis and has been maintained on Plaquenil in the past.      She has not taken this in a while and has had only a minimal cough with      this problem.  2.  She takes Wellbutrin and Lexapro for  some recent depression changes.  3.  She is on Allegra for allergies.  4.  Hydrochlorothiazide for blood pressure control.   She is allergic to no medications.   She is a nonsmoker and nondrinker.   FAMILY HISTORY:  Includes mother with a stroke at age 85 otherwise negative.   REVIEW OF SYSTEMS:  HEENT:  Negative.  CARDIORESPIRATORY:  As noted above.  GASTROINTESTINAL:  Negative.  GENITOURINARY:  As noted above.   PHYSICAL EXAMINATION:  VITAL SIGNS:  Height 5 feet 6.5 inches tall, weight  274, blood pressure 130/84, pulse 80, respirations 18.  GENERAL:  Well developed black female in no acute distress.  HEENT:  Within normal limits.  NECK:  Supple without masses, adenopathy, or bruit.  HEART:  Regular rate and rhythm without murmurs.  LUNGS:  Clear to P&A.  BREASTS:  Examined sitting and lying without mass.  Axilla negative.  ABDOMEN:  Soft without definite mass effect and nontender.  PELVIC:  External genitalia, Bartholin, urethral, and Skene glands within  normal limits.  Vagina is clean.  Cervix slightly inflamed.  Uterus is mid  position, approximately 10-12 weeks' gestational size and tender with  irregular contours.  Adnexal exam:  No palpable masses bilaterally,  nontender.  Anterior and posterior cul-de-sac exam is confirmatory.  EXTREMITIES:  Within normal limits.  CENTRAL NERVOUS SYSTEM:  Grossly intact.  SKIN:  Without suspicious lesions.   IMPRESSION:  1.  Recurrent leiomyomata uteri.  2.  Pelvic pain.  3.  Abnormal uterine bleeding.   DISPOSITION:  As noted above.           ______________________________  Almedia Balls. [name], M.D.     SRF/MEDQ  D:  09/01/2005  T:  09/01/2005  Job:  [id number]

## 2011-02-26 NOTE — Discharge Summary (Signed)
[name], [name]                  ACCOUNT NO.:  [id number]   MEDICAL RECORD NO.:  [id number]          PATIENT TYPE:  INP   LOCATION:  9303                          FACILITY:  [facility]   PHYSICIAN:  Almedia Balls. Fore, M.D.   DATE OF BIRTH:  02/27/1966   DATE OF ADMISSION:  09/08/2005  DATE OF DISCHARGE:  09/09/2005                                 DISCHARGE SUMMARY   HISTORY:  The patient is a 45 year old with abnormal uterine bleeding,  pelvic pain enlarging uterus, for hysterectomy. The remainder of her history  and physical are as previously dictated.   LABORATORY DATA:  Include preoperative hemoglobin 12.9. BMET panel normal  except for BUN low at 3. 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   HOSPITAL COURSE:  The patient was taken to the operating room on September 08, 2005, at which time abdominal supracervical hysterectomy was performed.  The patient did well postoperatively. Diet and ambulation were progressed  over the evening of November 29 and early morning of November 30. On the  morning of November 30 she was afebrile and experiencing no problems except  for pain, which was controlled by oral analgesics. It was felt that she  could be discharged at this time.   FINAL DIAGNOSES:  Abnormal uterine bleeding, pelvic pain, enlarging uterus.   OPERATION:  Abdominal supracervical hysterectomy, revision of keloid.  Pathology report unavailable at the time of dictation.   DISPOSITION:  Discharged home to return to the office in 2 weeks for follow-  up. She was instructed to gradually progress her activities over several  weeks at home and to limit lifting and driving for 2 weeks. She was fully  ambulatory, on a regular diet, and in good condition at the time of  discharge. She was given a prescription for Percocet 10/325 #30 to be taken  one q.4h. p.r.n. pain and doxycycline 100 mg #12 to be taken one b.i.d.     ______________________________  Almedia Balls. [name], M.D.     SRF/MEDQ  D:  09/09/2005  T:  09/10/2005  Job:  [id number]

## 2011-02-26 NOTE — Procedures (Signed)
NAME:  [name], [name] NO.:  [id number]   MEDICAL RECORD NO.:  [id number]          PATIENT TYPE:  OUT   LOCATION:  SLEEP CENTER                 FACILITY:  [facility]   PHYSICIAN:  Coralyn Helling, M.D.      DATE OF BIRTH:  07/10/1966   DATE OF STUDY:  06/15/2005                              NOCTURNAL POLYSOMNOGRAM   PROCEDURE:  Overnight polysomnogram.   INDICATIONS FOR PROCEDURE:  She has a history of snoring with witnessed  apneas and daytime hyper-somnolence.  She is being evaluated for episodes of  sleep apnea.  Her Epworth score is 14/24.   MEDICATIONS:  1.  Prednisone 40 mg daily.  2.  Wellbutrin 300 mg daily.  3.  Lexapro 10 mg daily.  4.  Hydrochlorothiazide 25 mg daily.   SLEEP ARCHITECTURE:  The recording time was 536 minutes.  The total sleep  time was 241.5 minutes.  Sleep proficiency was 50%.  Sleep onset was 163  minutes.  REM latency was 242 minutes.  The study was notable for a lack of  slow wave sleep and the patient slept predominantly in the non-supine  position.   RESPIRATORY DATA:  The apnea/hypopnea index was 28.3.  The respiratory efforts were exclusively obstructive in nature, with the  majority being obstructive hypopneas.   ACTION DATA:  Oxygen saturation Nadir was 93%.   CARDIAC DATA:  Electrocardiogram showed normal sinus rhythm.   MOVEMENT:  The periodic limb movement index was 3.7.   IMPRESSION:  Moderate obstructive sleep apnea, as demonstrated by an  apneic/hypopneic index of 28.3, with an oxygen saturation Nadir of 93%.   RECOMMENDATIONS:  The patient should be counseled with regards to the  importance of diet, exercise and weight reduction, the avoidance of alcohol  and sedatives.  Driving precautions should be discussed with the patient  until her sleep apnea is adequately treated.  Treatment options include CPAP  therapy, oral appliance or surgical intervention.  A CPAP titration study  should be recommended.      Coralyn Helling, M.D.  Diplomat, Biomedical engineer of Sleep Medicine  Electronically Signed     VS/MEDQ  D:  07/01/2005 17:40:06  T:  07/02/2005 14:10:30  Job:  [id number]

## 2011-02-26 NOTE — Op Note (Signed)
[name], [name]                  ACCOUNT NO.:  [id number]   MEDICAL RECORD NO.:  [id number]          PATIENT TYPE:  INP   LOCATION:  9399                          FACILITY:  [facility]   PHYSICIAN:  Almedia Balls. Fore, M.D.   DATE OF BIRTH:  1966/02/27   DATE OF PROCEDURE:  09/08/2005  DATE OF DISCHARGE:                                 OPERATIVE REPORT   PREOPERATIVE DIAGNOSES:  1.  Abnormal uterine bleeding.  2.  Pelvic pain.  3.  Uterine enlargement.   POSTOPERATIVE DIAGNOSES:  1.  Abnormal uterine bleeding.  2.  Pelvic pain.  3.  Uterine enlargement.  4.  Pending pathology.   OPERATION:  Abdominal supracervical hysterectomy.   ANESTHESIA:  General orotracheal.   OPERATOR:  Almedia Balls. [name], M.D.   FIRST ASSISTANT:  Leona Singleton, M.D.   INDICATION FOR SURGERY:  The patient is a 45 year old who had undergone  myomectomy years previously and continued to have abnormal bleeding with  some enlargement of her uterus and pain, for which she is now admitted for a  hysterectomy.  She has been fully counseled as to the nature of the  procedure and the risks involved, to include risk of anesthesia, injury to  bowel or bladder, blood vessels, ureters, postoperative hemorrhage,  infection, recuperation period.  She fully understands all these  considerations and has signed informed consent to proceed on September 08, 2005.   OPERATIVE FINDINGS:  Upon entry into the peritoneal cavity, the uterus was  enlarged to approximately six weeks' gestational size.  Ovaries and tubes  were normal.  Palpation of the upper abdominal viscera revealed all to be  normal with palpation.   PROCEDURE:  With the patient under general anesthesia, prepared and draped  in the usual sterile fashion, with a Foley catheter in the bladder, a lower  abdominal transverse incision was made after excising a previous surgical  scar.  The incision was carried into the peritoneal cavity without  difficulty.  A  self-retaining retractor was placed and the bowel was packed  off.  Kelly clamps were used to clamp the uterine-ovarian anastomoses, tubes  and round ligaments bilaterally for traction and hemostasis.  Round  ligaments were transected using Bovie electrocoagulation with development of  a bladder flap anteriorly and entry into the retroperitoneal space.  The  ovaries were normal, and it was felt that they should be conserved.  Heaney  clamps were placed proximal to the ovaries on the uterine-ovarian  anastomoses and tubes bilaterally; these structures were then cut and doubly  ligated with 1 chromic catgut.  The uterine vessels bilaterally were then  skeletonized, clamped, cut and suture ligated with 1 chromic catgut.  Cardinal ligaments bilaterally were clamped, cut and suture ligated with 1  chromic catgut.  It was then possible to excise the uterine fundus using  Bovie electrocoagulation to core out the endocervix.  The remaining portion  of the endocervix was rendered hemostatic and destroyed using Bovie  electrocoagulation.  The cervical stump was reapproximated and rendered  hemostatic with interrupted figure-of-eight sutures of 1  chromic catgut.  The area was lavaged with copious amounts of lactated Ringer's solution and  after noting that hemostasis was maintained in the surgical area, the  peritoneum was closed with a continuous suture of 0 Vicryl.  The fascia was  closed with two sutures of 0 PDS, which were brought from the lateral  aspects of the incision and tied separately in the midline.  Subcutaneous  fat was reapproximated with interrupted horizontal mattress sutures of 1  chromic catgut.  The skin was closed with a subcuticular suture of 3-0 plain  catgut.  Estimated blood loss 300 mL.  The patient was taken to the recovery  room in good condition with clear urine in the Foley catheter tubing.  She  will be placed on 23-hour observation following surgery.            ______________________________  Almedia Balls [name], M.D.     SRF/MEDQ  D:  09/08/2005  T:  09/08/2005  Job:  [id number]   cc:   Leona Singleton, M.D.  Fax: [phone]

## 2012-02-28 ENCOUNTER — Other Ambulatory Visit: Payer: Self-pay | Admitting: Pulmonary Disease

## 2012-07-26 ENCOUNTER — Ambulatory Visit (INDEPENDENT_AMBULATORY_CARE_PROVIDER_SITE_OTHER)
Admission: RE | Admit: 2012-07-26 | Discharge: 2012-07-26 | Disposition: A | Discharge status: Home / Self Care | Payer: 59 | Source: Ambulatory Visit | Attending: Pulmonary Disease | Admitting: Pulmonary Disease

## 2012-07-26 ENCOUNTER — Ambulatory Visit (INDEPENDENT_AMBULATORY_CARE_PROVIDER_SITE_OTHER): Payer: 59 | Admitting: Pulmonary Disease

## 2012-07-26 ENCOUNTER — Encounter: Payer: Self-pay | Admitting: Pulmonary Disease

## 2012-07-26 VITALS — BP 124/78 | HR 92 | Temp 97.8°F | Ht 66.0 in | Wt 309.0 lb

## 2012-07-26 DIAGNOSIS — J45909 Unspecified asthma, uncomplicated: Secondary | ICD-10-CM

## 2012-07-26 DIAGNOSIS — G4733 Obstructive sleep apnea (adult) (pediatric): Secondary | ICD-10-CM

## 2012-07-26 DIAGNOSIS — D869 Sarcoidosis, unspecified: Secondary | ICD-10-CM

## 2012-07-26 DIAGNOSIS — J4530 Mild persistent asthma, uncomplicated: Secondary | ICD-10-CM

## 2012-07-26 MED ORDER — MOMETASONE FURO-FORMOTEROL FUM 100-5 MCG/ACT IN AERO: 2.0000 | INHALATION_SPRAY | Freq: Two times a day (BID) | RESPIRATORY_TRACT | Status: DC

## 2012-07-26 NOTE — Progress Notes (Signed)
Primary care: Silvestre Moment Rheumatology: Stacey Drain  Chief Complaint  Patient presents with  . Follow-up    pt states that breathing has improved since last seen in past 2 years. No complaints today.    History of Present Illness: [name], asthma, and OSA.  I last saw [name] in February 2012.  She was running out of dulera, and was advised that she needed follow up before this could be refilled.  She has been on methotrexate and plaquenil with Dr. Kellie Simmering.  Her skin involvement has gotten much better.    She has been using dulera twice per day.  With this she does not have much trouble with cough, wheeze, or sputum.  She had to stop using CPAP recently due to problems with her mask fit irritating her nasal sarcoid involvement.  Tests: PSG 06/15/05>>AHI 28  CPAP 11 cm H2O PFT 06/19/09>>FEV1 1.78(62%), FVC 2.51(68%), FEV1% 71, TLC 4.03(74%), DLCO 65%, no BD  Past Medical History  Diagnosis Date  . Allergic rhinitis   . Anxiety   . Depression   . Hypertension   . Kidd     Pulmonary, skin involvement  . OSA (obstructive sleep apnea)        . Goiter   . Asthma          Past Surgical History  Procedure Date  . Abdominal hysterectomy     Current Outpatient Prescriptions on File Prior to Visit  Medication Sig Dispense Refill  . buPROPion (WELLBUTRIN XL) 300 MG 24 hr tablet Take 300 mg by mouth daily.      Marland Kitchen escitalopram (LEXAPRO) 10 MG tablet Take 10 mg by mouth daily.      . fluticasone (FLONASE) 50 MCG/ACT nasal spray Place 2 sprays into the nose daily.      . mometasone-formoterol (DULERA) 100-5 MCG/ACT AERO Inhale 2 puffs into the lungs 2 (two) times daily.  1 Inhaler  5  . simvastatin (ZOCOR) 20 MG tablet Take 20 mg by mouth every evening.      . TRIAMTERENE-HCTZ PO Take by mouth. Take 37.5-12.5 mg daily        Allergies  Allergen Reactions  . Ibuprofen     Physical  Exam: Filed Vitals:   07/26/12 1633  BP: 124/78  Pulse: 92  Temp: 97.8 F (36.6 C)  TempSrc: Oral  Height: 5\' 6"  (1.676 m)  Weight: 309 lb (140.161 kg)  SpO2: 98%  ,  Current Encounter SPO2  07/26/12 1633 98%  12/09/10 1152 99%  08/07/09 1449 97%    Wt Readings from Last 3 Encounters:  07/26/12 309 lb (140.161 kg)  12/09/10 296 lb (134.265 kg)  08/07/09 291 lb 8 oz (132.224 kg)    Body mass index is 49.87 kg/(m^2).   General - No distress ENT - No sinus tenderness, no oral exudate, no LAN Cardiac - s1s2 regular, no murmur Chest - No wheeze/rales/dullness Back - No focal tenderness Abd - Soft, non-tender Ext - ankle edema Neuro - Normal strength Skin - mild changes of sarcoid around nares Psych - Normal mood, and behavior.   Assessment/Plan:  Coralyn Helling, MD [place] [facility]/Critical Care/Sleep Pager:  [phone] 07/26/2012, 5:04 PM

## 2012-07-26 NOTE — Assessment & Plan Note (Signed)
She feels that CPAP helps, but has difficulty adjusting to mask with her nasal involvement from her sarcoid.  Will have her DME refit her CPAP mask.

## 2012-07-26 NOTE — Assessment & Plan Note (Signed)
Stable on dulera 

## 2012-07-26 NOTE — Assessment & Plan Note (Signed)
She is on chronic MTX and plaquenil per Dr. Kellie Simmering.    Will repeat CXR and PFT to assess current status.

## 2012-07-26 NOTE — Patient Instructions (Signed)
Chest xray today Will schedule breathing test (PFT) Will arrange for new CPAP mask Follow up in 6 months

## 2012-07-27 ENCOUNTER — Telehealth: Payer: Self-pay | Admitting: Pulmonary Disease

## 2012-07-27 NOTE — Telephone Encounter (Signed)
Dg Chest 2 View  07/26/2012  *RADIOLOGY REPORT*  Clinical Data: Follow-up sarcoidosis.  Asthma, hypertension.  CHEST - 2 VIEW  Comparison: 06/06/2009.  Chest CT 06/11/2009.  Findings: Heart and mediastinal contours are within normal limits. No focal opacities or effusions.  No acute bony abnormality.No appreciable mediastinal or hilar adenopathy by plain film.  IMPRESSION: No active cardiopulmonary disease.   Original Report Authenticated By: Cyndie Chime, M.D.     Will have my nurse inform pt that CXR looks good.  No evidence of sarcoid in lungs.  No change to current treatment plan.

## 2012-07-28 NOTE — Telephone Encounter (Signed)
lmomtcb x1 

## 2012-07-31 NOTE — Telephone Encounter (Signed)
Pt aware of results per VS and verbalized understanding.

## 2012-08-10 ENCOUNTER — Ambulatory Visit (INDEPENDENT_AMBULATORY_CARE_PROVIDER_SITE_OTHER): Payer: 59 | Admitting: Pulmonary Disease

## 2012-08-10 DIAGNOSIS — D860 Sarcoidosis of lung: Secondary | ICD-10-CM

## 2012-08-10 DIAGNOSIS — J45909 Unspecified asthma, uncomplicated: Secondary | ICD-10-CM

## 2012-08-10 DIAGNOSIS — J4530 Mild persistent asthma, uncomplicated: Secondary | ICD-10-CM

## 2012-08-10 DIAGNOSIS — J99 Respiratory disorders in diseases classified elsewhere: Secondary | ICD-10-CM

## 2012-08-10 DIAGNOSIS — D869 Sarcoidosis, unspecified: Secondary | ICD-10-CM

## 2012-08-10 LAB — PULMONARY FUNCTION TEST

## 2012-08-10 NOTE — Progress Notes (Signed)
PFT done today. 

## 2012-08-17 ENCOUNTER — Telehealth: Payer: Self-pay | Admitting: Pulmonary Disease

## 2012-08-17 NOTE — Telephone Encounter (Signed)
PFT 08/10/12>>FEV1 2.00 (72%), FEV1% 79, TLC 4.03 (75%), DLCO 70%, + BD from FEF 25-75%.  Reviewed results with pt.

## 2012-08-30 ENCOUNTER — Encounter: Payer: Self-pay | Admitting: Pulmonary Disease

## 2013-02-02 ENCOUNTER — Ambulatory Visit (INDEPENDENT_AMBULATORY_CARE_PROVIDER_SITE_OTHER): Payer: 59 | Admitting: Pulmonary Disease

## 2013-02-02 ENCOUNTER — Encounter: Payer: Self-pay | Admitting: Pulmonary Disease

## 2013-02-02 VITALS — BP 122/78 | HR 70 | Temp 98.3°F | Ht 66.0 in | Wt 295.2 lb

## 2013-02-02 DIAGNOSIS — J4530 Mild persistent asthma, uncomplicated: Secondary | ICD-10-CM

## 2013-02-02 DIAGNOSIS — D869 Sarcoidosis, unspecified: Secondary | ICD-10-CM

## 2013-02-02 DIAGNOSIS — G4733 Obstructive sleep apnea (adult) (pediatric): Secondary | ICD-10-CM

## 2013-02-02 DIAGNOSIS — J99 Respiratory disorders in diseases classified elsewhere: Secondary | ICD-10-CM

## 2013-02-02 DIAGNOSIS — J45909 Unspecified asthma, uncomplicated: Secondary | ICD-10-CM

## 2013-02-02 DIAGNOSIS — D860 Sarcoidosis of lung: Secondary | ICD-10-CM

## 2013-02-02 MED ORDER — FLUTICASONE PROPIONATE HFA 110 MCG/ACT IN AERO: 2.0000 | INHALATION_SPRAY | Freq: Two times a day (BID) | RESPIRATORY_TRACT | Status: DC

## 2013-02-02 NOTE — Assessment & Plan Note (Signed)
She is on chronic MTX and plaquenil per Dr. Kellie Simmering.

## 2013-02-02 NOTE — Patient Instructions (Signed)
Stop dulera Flovent two puffs twice per day, and rinse mouth after each use >> call if breathing gets worse after changing to flovent Follow up in 6 months

## 2013-02-02 NOTE — Assessment & Plan Note (Signed)
She will call her DME to get a new mask, and then try to resume CPAP.

## 2013-02-02 NOTE — Progress Notes (Signed)
Primary care: Silvestre Moment Rheumatology: Stacey Drain  Chief Complaint  Patient presents with  . Follow-up    Pt states her breathing has breathing has been fine. denies any cough, wheezing, chest tx. Pt has no complaints today and has been doing well overall.     History of Present Illness: [name] is a 47 y.o. female former smoker pulmonary/skin sarcoidosis, asthma, and OSA.  Her breathing has been doing well.  She denies cough, wheeze, or chest congestion.  She has not been able to use CPAP.  She is going to get a new mask, and then try using it again.  She has not needed to use albuterol.   Tests: PSG 06/15/05>>AHI 28, CPAP 11 cm H2O PFT 06/19/09>>FEV1 1.78(62%), FVC 2.51(68%), FEV1% 71, TLC 4.03(74%), DLCO 65%, no BD PFT 08/10/12>>FEV1 2.00 (72%), FEV1% 79, TLC 4.03 (75%), DLCO 70%, + BD from FEF 25-75%.  She  has a past medical history of Allergic rhinitis; Anxiety; Depression; Hypertension; Sarcoidosis; OSA (obstructive sleep apnea); Goiter; and Asthma.  She  has past surgical history that includes Abdominal hysterectomy.   Current Outpatient Prescriptions on File Prior to Visit  Medication Sig Dispense Refill  . buPROPion (WELLBUTRIN XL) 300 MG 24 hr tablet Take 300 mg by mouth daily.      Marland Kitchen escitalopram (LEXAPRO) 10 MG tablet Take 10 mg by mouth daily.      . fluticasone (FLONASE) 50 MCG/ACT nasal spray Place 2 sprays into the nose daily.      . folic acid (FOLVITE) 400 MCG tablet Take 400 mcg by mouth daily.      . hydroxychloroquine (PLAQUENIL) 200 MG tablet Take 400 mg by mouth daily.      . Iron TABS Take 2 tablets by mouth daily.      . methotrexate (RHEUMATREX) 2.5 MG tablet Take 2.5 mg by mouth once a week. Take 3 tablets Tuesday and 2 tablets on Wednesday......Marland KitchenCaution:Chemotherapy. Protect from light.      . mometasone-formoterol (DULERA) 100-5 MCG/ACT AERO Inhale 2 puffs into the lungs 2 (two) times daily.  1 Inhaler  5  . simvastatin (ZOCOR) 20 MG  tablet Take 20 mg by mouth every evening.      . TRIAMTERENE-HCTZ PO Take by mouth. Take 37.5-12.5 mg daily       No current facility-administered medications on file prior to visit.    Allergies  Allergen Reactions  . Ibuprofen     Physical Exam:  General - No distress ENT - No sinus tenderness, no oral exudate, no LAN Cardiac - s1s2 regular, no murmur Chest - No wheeze/rales/dullness Back - No focal tenderness Abd - Soft, non-tender Ext - ankle edema Neuro - Normal strength Skin - mild changes of sarcoid around nares Psych - Normal mood, and behavior  Dg Chest 2 View  07/26/2012  *RADIOLOGY REPORT*  Clinical Data: Follow-up sarcoidosis.  Asthma, hypertension.  CHEST - 2 VIEW  Comparison: 06/06/2009.  Chest CT 06/11/2009.  Findings: Heart and mediastinal contours are within normal limits. No focal opacities or effusions.  No acute bony abnormality.No appreciable mediastinal or hilar adenopathy by plain film.  IMPRESSION: No active cardiopulmonary disease.   Original Report Authenticated By: Cyndie Chime, M.D.      Assessment/Plan:  Coralyn Helling, MD [place] [facility]/Critical Care/Sleep Pager:  [phone] 02/02/2013, 4:38 PM

## 2013-02-02 NOTE — Assessment & Plan Note (Signed)
Doing well.  Will try to decrease her inhaler regimen.  Will try her on flovent (have give sample), and stop dulera.  She is to call if her breathing gets worse.

## 2013-04-09 ENCOUNTER — Ambulatory Visit: Payer: 59 | Attending: Family Medicine | Admitting: Physical Therapy

## 2013-04-09 DIAGNOSIS — M25569 Pain in unspecified knee: Secondary | ICD-10-CM | POA: Insufficient documentation

## 2013-04-09 DIAGNOSIS — M545 Low back pain, unspecified: Secondary | ICD-10-CM | POA: Insufficient documentation

## 2013-04-09 DIAGNOSIS — IMO0001 Reserved for inherently not codable concepts without codable children: Secondary | ICD-10-CM | POA: Insufficient documentation

## 2013-04-09 DIAGNOSIS — R5381 Other malaise: Secondary | ICD-10-CM | POA: Insufficient documentation

## 2013-04-12 ENCOUNTER — Ambulatory Visit: Payer: 59 | Attending: Family Medicine | Admitting: Physical Therapy

## 2013-04-12 DIAGNOSIS — IMO0001 Reserved for inherently not codable concepts without codable children: Secondary | ICD-10-CM | POA: Insufficient documentation

## 2013-04-12 DIAGNOSIS — M545 Low back pain, unspecified: Secondary | ICD-10-CM | POA: Insufficient documentation

## 2013-04-12 DIAGNOSIS — R5381 Other malaise: Secondary | ICD-10-CM | POA: Insufficient documentation

## 2013-04-12 DIAGNOSIS — M25569 Pain in unspecified knee: Secondary | ICD-10-CM | POA: Insufficient documentation

## 2013-04-16 ENCOUNTER — Ambulatory Visit: Payer: 59 | Admitting: Physical Therapy

## 2013-04-19 ENCOUNTER — Encounter: Payer: 59 | Admitting: Physical Therapy

## 2013-04-23 ENCOUNTER — Ambulatory Visit: Payer: 59 | Admitting: Physical Therapy

## 2013-04-26 ENCOUNTER — Ambulatory Visit: Payer: 59 | Admitting: Physical Therapy

## 2013-04-30 ENCOUNTER — Ambulatory Visit: Payer: 59 | Admitting: Physical Therapy

## 2013-05-03 ENCOUNTER — Encounter: Payer: 59 | Admitting: Physical Therapy

## 2013-05-07 ENCOUNTER — Encounter: Payer: 59 | Admitting: Physical Therapy

## 2013-05-10 ENCOUNTER — Encounter: Payer: 59 | Admitting: Physical Therapy

## 2013-05-14 ENCOUNTER — Ambulatory Visit: Payer: 59 | Admitting: Physical Therapy

## 2013-08-13 ENCOUNTER — Telehealth: Payer: Self-pay | Admitting: Pulmonary Disease

## 2013-08-13 NOTE — Telephone Encounter (Signed)
Called Patient to set up follow up apt, Left message x3. No return call back. Sent letter 08/13/13  ° °

## 2013-09-25 ENCOUNTER — Encounter (INDEPENDENT_AMBULATORY_CARE_PROVIDER_SITE_OTHER): Payer: Self-pay

## 2013-09-25 ENCOUNTER — Ambulatory Visit (INDEPENDENT_AMBULATORY_CARE_PROVIDER_SITE_OTHER): Payer: 59 | Admitting: Pulmonary Disease

## 2013-09-25 ENCOUNTER — Encounter: Payer: Self-pay | Admitting: Pulmonary Disease

## 2013-09-25 VITALS — BP 120/84 | HR 78 | Ht 66.0 in | Wt 296.0 lb

## 2013-09-25 DIAGNOSIS — J99 Respiratory disorders in diseases classified elsewhere: Secondary | ICD-10-CM

## 2013-09-25 DIAGNOSIS — J4530 Mild persistent asthma, uncomplicated: Secondary | ICD-10-CM

## 2013-09-25 DIAGNOSIS — D860 Sarcoidosis of lung: Secondary | ICD-10-CM

## 2013-09-25 DIAGNOSIS — J45909 Unspecified asthma, uncomplicated: Secondary | ICD-10-CM

## 2013-09-25 DIAGNOSIS — D869 Sarcoidosis, unspecified: Secondary | ICD-10-CM

## 2013-09-25 DIAGNOSIS — G4733 Obstructive sleep apnea (adult) (pediatric): Secondary | ICD-10-CM

## 2013-09-25 NOTE — Assessment & Plan Note (Signed)
Advised she could try gradually weaning of flovent.  She is to change to flovent 2 puffs daily for 2 weeks >> if stable, then she can stop flovent.

## 2013-09-25 NOTE — Progress Notes (Signed)
Primary care: Silvestre Moment Rheumatology: Stacey Drain  Chief Complaint  Patient presents with  . Asthma    Breathing is doing well. Denies SOB, chest tightness, coughing or wheezing at this time.    History of Present Illness: [name] is a 47 y.o. female former smoker pulmonary/skin sarcoidosis, asthma, and OSA.  Her breathing has been doing well.  She uses flovent bid.  She does not have albuterol.  She does not feel like she needs extra inhalers.  She is not having cough, wheeze, or sputum.  She continues to snore.  She reports sleep disruption, apnea, and daytime sleepiness.  She has not used CPAP for a year.  She has not been able to find a mask that is comfortable.   Tests: PSG 06/15/05>>AHI 28, CPAP 11 cm H2O PFT 06/19/09>>FEV1 1.78(62%), FVC 2.51(68%), FEV1% 71, TLC 4.03(74%), DLCO 65%, no BD PFT 08/10/12>>FEV1 2.00 (72%), FEV1% 79, TLC 4.03 (75%), DLCO 70%, + BD from FEF 25-75%.  She  has a past medical history of Allergic rhinitis; Anxiety; Depression; Hypertension; Sarcoidosis; OSA (obstructive sleep apnea); Goiter; and Asthma.  She  has past surgical history that includes Abdominal hysterectomy.   Current Outpatient Prescriptions on File Prior to Visit  Medication Sig Dispense Refill  . buPROPion (WELLBUTRIN XL) 300 MG 24 hr tablet Take 300 mg by mouth daily.      Marland Kitchen escitalopram (LEXAPRO) 10 MG tablet Take 10 mg by mouth daily.      . fluticasone (FLONASE) 50 MCG/ACT nasal spray Place 2 sprays into the nose daily.      . fluticasone (FLOVENT HFA) 110 MCG/ACT inhaler Inhale 2 puffs into the lungs 2 (two) times daily.  1 Inhaler  12  . folic acid (FOLVITE) 400 MCG tablet Take 400 mcg by mouth daily.      . hydroxychloroquine (PLAQUENIL) 200 MG tablet Take 400 mg by mouth daily.      . Iron TABS Take 2 tablets by mouth daily.      . methotrexate (RHEUMATREX) 2.5 MG tablet Take 2.5 mg by mouth once a week. Take 3 tablets Tuesday and 2 tablets on  Wednesday......Marland KitchenCaution:Chemotherapy. Protect from light.      . simvastatin (ZOCOR) 20 MG tablet Take 20 mg by mouth every evening.      . TRIAMTERENE-HCTZ PO Take by mouth. Take 37.5-12.5 mg daily       No current facility-administered medications on file prior to visit.    Allergies  Allergen Reactions  . Ibuprofen     Physical Exam:  General - No distress ENT - No sinus tenderness, no oral exudate, no LAN Cardiac - s1s2 regular, no murmur Chest - No wheeze/rales/dullness Back - No focal tenderness Abd - Soft, non-tender Ext - ankle edema Neuro - Normal strength Skin - minimal scarring around nares Psych - Normal mood, and behavior   Assessment/Plan:  Bonnie Helling, MD [place] [facility]/Critical Care/Sleep Pager:  [phone] 09/25/2013, 2:53 PM

## 2013-09-25 NOTE — Assessment & Plan Note (Signed)
She is on chronic MTX and plaquenil per Dr. Kellie Simmering.

## 2013-09-25 NOTE — Assessment & Plan Note (Signed)
She has persistent snoring, sleep disruption, daytime sleepiness, and apnea.  She has history of hypertension and pulmonary sarcoidosis.  Will arrange for repeat in lab sleep study to further assess status of her sleep apnea.

## 2013-09-25 NOTE — Patient Instructions (Signed)
Will arrange for sleep study Will call to arrange for follow up after sleep study reviewed 

## 2013-10-30 ENCOUNTER — Ambulatory Visit (HOSPITAL_BASED_OUTPATIENT_CLINIC_OR_DEPARTMENT_OTHER): Payer: 59 | Attending: Pulmonary Disease | Admitting: Radiology

## 2013-10-30 VITALS — Ht 66.0 in | Wt 290.0 lb

## 2013-10-30 DIAGNOSIS — G4730 Sleep apnea, unspecified: Secondary | ICD-10-CM | POA: Insufficient documentation

## 2013-10-30 DIAGNOSIS — R0609 Other forms of dyspnea: Secondary | ICD-10-CM | POA: Insufficient documentation

## 2013-10-30 DIAGNOSIS — D869 Sarcoidosis, unspecified: Secondary | ICD-10-CM | POA: Insufficient documentation

## 2013-10-30 DIAGNOSIS — I10 Essential (primary) hypertension: Secondary | ICD-10-CM | POA: Insufficient documentation

## 2013-10-30 DIAGNOSIS — G4733 Obstructive sleep apnea (adult) (pediatric): Secondary | ICD-10-CM

## 2013-10-30 DIAGNOSIS — R0989 Other specified symptoms and signs involving the circulatory and respiratory systems: Secondary | ICD-10-CM | POA: Insufficient documentation

## 2013-10-30 DIAGNOSIS — Z79899 Other long term (current) drug therapy: Secondary | ICD-10-CM | POA: Insufficient documentation

## 2013-10-30 DIAGNOSIS — J99 Respiratory disorders in diseases classified elsewhere: Secondary | ICD-10-CM | POA: Insufficient documentation

## 2013-10-30 DIAGNOSIS — Z6841 Body Mass Index (BMI) 40.0 and over, adult: Secondary | ICD-10-CM | POA: Insufficient documentation

## 2013-11-02 ENCOUNTER — Telehealth: Payer: Self-pay | Admitting: Pulmonary Disease

## 2013-11-02 DIAGNOSIS — G4733 Obstructive sleep apnea (adult) (pediatric): Secondary | ICD-10-CM

## 2013-11-02 NOTE — Sleep Study (Signed)
[facility]  NAME: Bonnie FloorJudy L Kidd DATE OF BIRTH:  15-Jun-1966 MEDICAL RECORD NUMBER [id number]  LOCATION: [facility]  PHYSICIAN: Coralyn HellingVineet Kaiser Belluomini, M.D. DATE OF STUDY: 10/30/2013  SLEEP STUDY TYPE: Nocturnal Polysomnogram               REFERRING PHYSICIAN: Coralyn HellingSood, Shia Delaine, MD  INDICATION FOR STUDY:  48 year old with persistent snoring, sleep disruption, daytime sleepiness, and apnea. She has history of hypertension and pulmonary sarcoidosis.  She has prior history of sleep apnea.  She is referred to sleep lab to further assess status of sleep apnea.   EPWORTH SLEEPINESS SCORE: 10. HEIGHT: 5\' 6"  (167.6 cm)  WEIGHT: 290 lb (131.543 kg)    Body mass index is 46.83 kg/(m^2).  NECK SIZE: 15 in.  MEDICATIONS:  Current Outpatient Prescriptions on File Prior to Visit  Medication Sig Dispense Refill  . buPROPion (WELLBUTRIN XL) 300 MG 24 hr tablet Take 300 mg by mouth daily.      Marland Kitchen. escitalopram (LEXAPRO) 10 MG tablet Take 10 mg by mouth daily.      . fluticasone (FLONASE) 50 MCG/ACT nasal spray Place 2 sprays into the nose daily.      . fluticasone (FLOVENT HFA) 110 MCG/ACT inhaler Inhale 2 puffs into the lungs 2 (two) times daily.  1 Inhaler  12  . folic acid (FOLVITE) 400 MCG tablet Take 400 mcg by mouth daily.      . hydroxychloroquine (PLAQUENIL) 200 MG tablet Take 400 mg by mouth daily.      . Iron TABS Take 2 tablets by mouth daily.      . methotrexate (RHEUMATREX) 2.5 MG tablet Take 2.5 mg by mouth once a week. Take 3 tablets Tuesday and 2 tablets on Wednesday......Marland Kitchen.Caution:Chemotherapy. Protect from light.      . simvastatin (ZOCOR) 20 MG tablet Take 20 mg by mouth every evening.      . TRIAMTERENE-HCTZ PO Take by mouth. Take 37.5-12.5 mg daily       No current facility-administered medications on file prior to visit.    SLEEP ARCHITECTURE:  Total recording time: 394 minutes.  Total sleep time was: 211 minutes.  Sleep efficiency: 53.6%.  Sleep  latency: 78 minutes.  REM latency: 295.5 minutes.  Stage N1: 18.2%.  Stage N2: 72.5%.  Stage N3: 0%.  Stage R:  9.2%.  Supine sleep: 0 minutes.  Non-supine sleep: 211 minutes.  RESPIRATORY DATA: Average respiratory rate: 18. Snoring: Loud. Average AHI: 10.   Apnea index: 3.7.  Hypopnea index: 6.3. Obstructive apnea index: 3.7.  Central apnea index: 0.  Mixed apnea index: 0. REM AHI: 58.5.  NREM AHI: 5. Supine AHI: 0. Non-supine AHI: 5.5.  OXYGEN DATA:  Baseline oxygenation: 99%. Lowest SaO2: 76%. Time spent below SaO2 90%: 3.7 minutes. Supplemental oxygen used: None.  CARDIAC DATA:  Average heart rate: 73 beats per minute. Rhythm strip: Sinus rhythm.  MOVEMENT/PARASOMNIA:  Periodic limb movement: 4.5.  Period limb movements with arousals: 1.7. Restroom trips: One.  IMPRESSION/ RECOMMENDATION:   This study shows evidence for mild sleep apnea with an AHI of 10 and SaO2 low of 76%.  She did have a significant REM effect to her sleep apnea.   Additional therapies include weight loss, CPAP, oral appliance, or surgical evaluation.   Coralyn HellingVineet Jakeob Tullis, M.D. Diplomate, Biomedical engineerAmerican Board of Sleep Medicine  ELECTRONICALLY SIGNED ON:  11/02/2013, 5:59 PM [facility] PH: [phone]   FX: [phone] ACCREDITED BY  THE AMERICAN ACADEMY OF SLEEP MEDICINE

## 2013-11-02 NOTE — Telephone Encounter (Addendum)
PSG 10/30/13 >> AHI 10, REM AHI 58.5, SpO2 low 76%.  Will have my nurse schedule ROV to review results.

## 2013-11-05 NOTE — Telephone Encounter (Signed)
lmtcb x1 

## 2013-11-05 NOTE — Telephone Encounter (Signed)
ROV has been scheduled for 11/23/13 at 2:45pm.

## 2013-11-23 ENCOUNTER — Encounter: Payer: Self-pay | Admitting: Pulmonary Disease

## 2013-11-23 ENCOUNTER — Ambulatory Visit (INDEPENDENT_AMBULATORY_CARE_PROVIDER_SITE_OTHER)
Admission: RE | Admit: 2013-11-23 | Discharge: 2013-11-23 | Disposition: A | Discharge status: Home / Self Care | Payer: 59 | Source: Ambulatory Visit | Attending: Pulmonary Disease | Admitting: Pulmonary Disease

## 2013-11-23 ENCOUNTER — Encounter (INDEPENDENT_AMBULATORY_CARE_PROVIDER_SITE_OTHER): Payer: Self-pay

## 2013-11-23 ENCOUNTER — Ambulatory Visit (INDEPENDENT_AMBULATORY_CARE_PROVIDER_SITE_OTHER): Payer: 59 | Admitting: Pulmonary Disease

## 2013-11-23 ENCOUNTER — Telehealth: Payer: Self-pay | Admitting: Pulmonary Disease

## 2013-11-23 VITALS — BP 128/88 | HR 87 | Temp 98.6°F | Ht 66.0 in | Wt 306.0 lb

## 2013-11-23 DIAGNOSIS — J99 Respiratory disorders in diseases classified elsewhere: Secondary | ICD-10-CM

## 2013-11-23 DIAGNOSIS — R059 Cough, unspecified: Secondary | ICD-10-CM

## 2013-11-23 DIAGNOSIS — R05 Cough: Secondary | ICD-10-CM

## 2013-11-23 DIAGNOSIS — G4733 Obstructive sleep apnea (adult) (pediatric): Secondary | ICD-10-CM

## 2013-11-23 DIAGNOSIS — D869 Sarcoidosis, unspecified: Secondary | ICD-10-CM

## 2013-11-23 DIAGNOSIS — J45909 Unspecified asthma, uncomplicated: Secondary | ICD-10-CM

## 2013-11-23 DIAGNOSIS — J4530 Mild persistent asthma, uncomplicated: Secondary | ICD-10-CM

## 2013-11-23 DIAGNOSIS — D860 Sarcoidosis of lung: Secondary | ICD-10-CM

## 2013-11-23 MED ORDER — ALBUTEROL SULFATE HFA 108 (90 BASE) MCG/ACT IN AERS: 2.0000 | INHALATION_SPRAY | Freq: Four times a day (QID) | RESPIRATORY_TRACT | Status: DC | PRN

## 2013-11-23 NOTE — Patient Instructions (Signed)
Chest xray today >> will call with results Proair two puffs up to four times per day as needed for cough, wheeze, or chest congestion Will arrange for CPAP titration study Will schedule follow up after sleep study reviewed

## 2013-11-23 NOTE — Telephone Encounter (Signed)
Dg Chest 2 View  11/23/2013   CLINICAL DATA:  Followup sarcoidosis.  EXAM: CHEST  2 VIEW  COMPARISON:  07/26/2012  FINDINGS: Cardiac silhouette is normal in size. Normal mediastinal contours. No appreciable mediastinal or hilar adenopathy.  Clear lungs.  No evidence of interstitial lung disease.  No pleural effusion or pneumothorax.  The bony thorax is unremarkable.  IMPRESSION: Normal chest radiographs. Stable appearance from the prior study. No radiographic evidence of mediastinal or hilar adenopathy or of interstitial lung disease.   Electronically Signed   By: Amie Portlandavid  Ormond M.D.   On: 11/23/2013 15:40    Will have my nurse inform pt that CXR was normal.  No change to current treatment plan.

## 2013-11-23 NOTE — Telephone Encounter (Signed)
Pt is aware of results. Nothing further was needed. 

## 2013-11-23 NOTE — Assessment & Plan Note (Signed)
She has noticed more cough since December 2014 after respiratory infection.  Will repeat chest xray today >> depending on results will determine if she needs further assessment with CT chest and PFT's.

## 2013-11-23 NOTE — Progress Notes (Signed)
Primary care: Silvestre Momentarol Westerman Rheumatology: Stacey DrainWilliam Truslow  Chief Complaint  Patient presents with  . Follow-up    Review sleep study.     History of Present Illness: [name] is a 48 y.o. female former smoker pulmonary/skin sarcoidosis, asthma, and OSA.  She is hear to review her sleep study.  This showed mild sleep apnea overall, but severe in REM sleep and with significant oxygen desaturation.  She was not able to tolerate decrease in flovent >> caused more dyspnea, cough, and wheeze.  She still has cough, and this has lingered since she had a virus in December 2014.  Several of her family members had the flu >> she thinks she had the flu also.  She has tried OTC cough medicines.  She has not had recent chest xray.  Tests: PSG 06/15/05>>AHI 28, CPAP 11 cm H2O PFT 06/19/09>>FEV1 1.78(62%), FVC 2.51(68%), FEV1% 71, TLC 4.03(74%), DLCO 65%, no BD PFT 08/10/12>>FEV1 2.00 (72%), FEV1% 79, TLC 4.03 (75%), DLCO 70%, + BD from FEF 25-75%. PSG 10/30/13 >> AHI 10, REM AHI 58.5, SpO2 low 76%.  She  has a past medical history of Allergic rhinitis; Anxiety; Depression; Hypertension; Sarcoidosis; OSA (obstructive sleep apnea); Goiter; and Asthma.  She  has past surgical history that includes Abdominal hysterectomy.   Current Outpatient Prescriptions on File Prior to Visit  Medication Sig Dispense Refill  . buPROPion (WELLBUTRIN XL) 300 MG 24 hr tablet Take 300 mg by mouth daily.      Marland Kitchen. escitalopram (LEXAPRO) 10 MG tablet Take 10 mg by mouth daily.      . fluticasone (FLONASE) 50 MCG/ACT nasal spray Place 2 sprays into the nose daily.      . fluticasone (FLOVENT HFA) 110 MCG/ACT inhaler Inhale 2 puffs into the lungs 2 (two) times daily.  1 Inhaler  12  . folic acid (FOLVITE) 400 MCG tablet Take 400 mcg by mouth daily.      . hydroxychloroquine (PLAQUENIL) 200 MG tablet Take 400 mg by mouth daily.      . Iron TABS Take 2 tablets by mouth daily.      . methotrexate (RHEUMATREX) 2.5 MG  tablet Take 2.5 mg by mouth once a week. Take 3 tablets Tuesday and 2 tablets on Wednesday......Marland Kitchen.Caution:Chemotherapy. Protect from light.      . simvastatin (ZOCOR) 20 MG tablet Take 20 mg by mouth every evening.      . TRIAMTERENE-HCTZ PO Take by mouth. Take 37.5-12.5 mg daily       No current facility-administered medications on file prior to visit.    Allergies  Allergen Reactions  . Ibuprofen     Physical Exam:  General - No distress ENT - No sinus tenderness, no oral exudate, no LAN, upper dentures Cardiac - s1s2 regular, no murmur Chest - coarse breath sounds b/l, no wheeze Back - No focal tenderness Abd - Soft, non-tender Ext - ankle edema Neuro - Normal strength Skin - minimal scarring around nares Psych - Normal mood, and behavior   Assessment/Plan:  Coralyn HellingVineet Jasten Guyette, MD [place] Pager:  [phone] 11/23/2013, 3:01 PM

## 2013-11-23 NOTE — Assessment & Plan Note (Signed)
She was not able to tolerate decrease in dose of flovent.  She is to continue flovent bid.  Will also have her use proair prn.

## 2013-11-23 NOTE — Assessment & Plan Note (Signed)
She has mild sleep apnea, but severe in REM sleep.  She also had significant oxygen desaturation.  I have reviewed the recent sleep study results with the patient.  We discussed how sleep apnea can affect various health problems including risks for hypertension, cardiovascular disease, and diabetes.  We also discussed how sleep disruption can increase risks for accident, such as while driving.  Weight loss as a means of improving sleep apnea was also reviewed.  Additional treatment options discussed were CPAP therapy, oral appliance, and surgical intervention.  Will arrange for in lab titration study.  Will determine if she needs CPAP vs BiPAP therapy +/- supplemental oxygen.

## 2013-12-30 ENCOUNTER — Ambulatory Visit (HOSPITAL_BASED_OUTPATIENT_CLINIC_OR_DEPARTMENT_OTHER): Payer: 59 | Attending: Pulmonary Disease

## 2013-12-30 DIAGNOSIS — D869 Sarcoidosis, unspecified: Secondary | ICD-10-CM

## 2013-12-30 DIAGNOSIS — G4733 Obstructive sleep apnea (adult) (pediatric): Secondary | ICD-10-CM

## 2014-01-07 ENCOUNTER — Encounter (INDEPENDENT_AMBULATORY_CARE_PROVIDER_SITE_OTHER): Payer: Self-pay | Admitting: Surgery

## 2014-01-07 ENCOUNTER — Ambulatory Visit (INDEPENDENT_AMBULATORY_CARE_PROVIDER_SITE_OTHER): Payer: 59 | Admitting: Surgery

## 2014-01-07 ENCOUNTER — Other Ambulatory Visit (INDEPENDENT_AMBULATORY_CARE_PROVIDER_SITE_OTHER): Payer: Self-pay

## 2014-01-07 LAB — COMPREHENSIVE METABOLIC PANEL
ALT: 26 U/L (ref 0–35)
AST: 25 U/L (ref 0–37)
Albumin: 3.9 g/dL (ref 3.5–5.2)
Alkaline Phosphatase: 87 U/L (ref 39–117)
BUN: 14 mg/dL (ref 6–23)
CO2: 32 mEq/L (ref 19–32)
Calcium: 9.1 mg/dL (ref 8.4–10.5)
Chloride: 100 mEq/L (ref 96–112)
Creat: 1.09 mg/dL (ref 0.50–1.10)
Glucose, Bld: 77 mg/dL (ref 70–99)
Potassium: 3.6 mEq/L (ref 3.5–5.3)
Sodium: 139 mEq/L (ref 135–145)
Total Bilirubin: 0.2 mg/dL (ref 0.2–1.2)
Total Protein: 6.6 g/dL (ref 6.0–8.3)

## 2014-01-07 LAB — CBC WITH DIFFERENTIAL/PLATELET
Basophils Absolute: 0.1 10*3/uL (ref 0.0–0.1)
Basophils Relative: 1 % (ref 0–1)
Eosinophils Absolute: 0.2 10*3/uL (ref 0.0–0.7)
Eosinophils Relative: 3 % (ref 0–5)
HCT: 34.7 % — ABNORMAL LOW (ref 36.0–46.0)
Hemoglobin: 11.4 g/dL — ABNORMAL LOW (ref 12.0–15.0)
Lymphocytes Relative: 23 % (ref 12–46)
Lymphs Abs: 1.4 10*3/uL (ref 0.7–4.0)
MCH: 27.7 pg (ref 26.0–34.0)
MCHC: 32.9 g/dL (ref 30.0–36.0)
MCV: 84.4 fL (ref 78.0–100.0)
Monocytes Absolute: 0.4 10*3/uL (ref 0.1–1.0)
Monocytes Relative: 7 % (ref 3–12)
Neutro Abs: 4.1 10*3/uL (ref 1.7–7.7)
Neutrophils Relative %: 66 % (ref 43–77)
Platelets: 341 10*3/uL (ref 150–400)
RBC: 4.11 MIL/uL (ref 3.87–5.11)
RDW: 15.5 % (ref 11.5–15.5)
WBC: 6.2 10*3/uL (ref 4.0–10.5)

## 2014-01-07 NOTE — Progress Notes (Addendum)
Re:   [name] DOB:   1966-02-27 MRN:   [id number]  ASSESSMENT AND PLAN: 1.  Morbid obesity  Initial weight - 300, BMI - 48.5  Per the 1991 NIH Consensus Statement, the patient is a candidate for bariatric surgery.  The patient attended our information session and reviewed the different types of bariatric surgery.    The patient is interested in the laparoscopic adjustable gastric band.  I discussed with the patient the indications and risks of lap band surgery.  The potential risks of surgery include, but are not limited to, bleeding, infection, DVT and PE, slippage and erosion of the band, open surgery, and death.  The patient understands the importance of compliance and long term follow-up with our group after surgery.  She was given literature regarding lap band surgery.  From here we'll obtain labs, x-rays, nutrition consult, and psych consult.  I encouraged her to lose any weight she can before surgery.  2.  Sleep apnea 3.  HTN x 10 years 4.  Depression  Followed by Dr. Hyman HopesWebb. 5.  Sarcoidosis 6.  Asthma  Chief Complaint  Patient presents with  . Obesity    New bari   REFERRING PHYSICIAN: WEBB, CAROL, D, MD  HISTORY OF PRESENT ILLNESS: [name] is a 48 y.o. (DOB: 1966-02-27)  AA  female whose primary care physician is WEBB, CAROL, D, MD (she was seeing Dr. Clyde CanterburyWesterman) and comes to me today for weight loss surgery.  Patient has been to 2 information sessions about weight loss surgery. She knows at least one person at work who has had a LAP-BAND surgery.  She has tried Weight Watchers at least 3 times, but always gained her weight back. She has tried weight loss on her own without success. She has not tried medication for weight loss.    Past Medical History  Diagnosis Date  . Allergic rhinitis   . Anxiety   . Depression   . Hypertension   . Sarcoidosis     Pulmonary, skin involvement  . OSA (obstructive sleep apnea)        . Goiter   . Asthma             Past Surgical History  Procedure Laterality Date  . Abdominal hysterectomy        Current Outpatient Prescriptions  Medication Sig Dispense Refill  . albuterol (PROAIR HFA) 108 (90 BASE) MCG/ACT inhaler Inhale 2 puffs into the lungs every 6 (six) hours as needed for wheezing or shortness of breath.  1 Inhaler  3  . buPROPion (WELLBUTRIN XL) 300 MG 24 hr tablet Take 300 mg by mouth daily.      Marland Kitchen. escitalopram (LEXAPRO) 10 MG tablet Take 10 mg by mouth daily.      . fluticasone (FLONASE) 50 MCG/ACT nasal spray Place 2 sprays into the nose daily.      . fluticasone (FLOVENT HFA) 110 MCG/ACT inhaler Inhale 2 puffs into the lungs 2 (two) times daily.  1 Inhaler  12  . folic acid (FOLVITE) 400 MCG tablet Take 400 mcg by mouth daily.      . hydroxychloroquine (PLAQUENIL) 200 MG tablet Take 400 mg by mouth daily.      . Iron TABS Take 2 tablets by mouth daily.      . methotrexate (RHEUMATREX) 2.5 MG tablet Take 2.5 mg by mouth once a week. Take 3 tablets Tuesday and 2 tablets on Wednesday......Marland Kitchen.Caution:Chemotherapy. Protect from light.      .Marland Kitchen  simvastatin (ZOCOR) 20 MG tablet Take 20 mg by mouth every evening.      . TRIAMTERENE-HCTZ PO Take by mouth. Take 37.5-12.5 mg daily       No current facility-administered medications for this visit.      Allergies  Allergen Reactions  . Ibuprofen     REVIEW OF SYSTEMS: Skin:  Some skin changes, particularly of face, from sarcoid. Infection:  No history of hepatitis or HIV.  No history of MRSA. Neurologic:  No history of stroke.  No history of seizure.  No history of headaches. Cardiac:  Hypertension x 10 years. No history of heart disease.  No history of prior cardiac catheterization.  No history of seeing a cardiologist. Pulmonary:  History of smoking cigarettes about 15 years ago  She has sleep apnea.  She has asthma.  Lungs affected some by sarcoid.  Endocrine:  No diabetes. No thyroid disease. Gastrointestinal:  No history of stomach  disease.  No history of liver disease.  No history of gall bladder disease.  No history of pancreas disease.  She had a colonoscopy by Dr. Dulce Sellar in 17-Mar-2010. Urologic:  No history of kidney stones.  No history of bladder infections. GYN   Hysterectomy 2005-03-17 for benign disease. Musculoskeletal:  Back issues, followed by Dr. Kellie Simmering.  Her hips also bother her, but she is not seeing ortho. Hematologic:  No bleeding disorder.  No history of anemia.  Not anticoagulated. Psycho-social:  History of depression since her parents passed away in 03-17-2004.  SOCIAL and FAMILY HISTORY: Unmarried. Lives with sister. Works as Diplomatic Services operational officer for [employer].  PHYSICAL EXAM: BP 120/80  Pulse 80  Temp(Src) 97.6 F (36.4 C) (Temporal)  Resp 18  Ht 5\' 6"  (1.676 m)  Wt 300 lb 3.2 oz (136.17 kg)  BMI 48.48 kg/m2  General: AA obese F who is alert and generally healthy appearing.  HEENT: Normal. Pupils equal. Neck: Supple. No mass.  No thyroid mass. Lymph Nodes:  No supraclavicular or cervical nodes. Lungs: Clear to auscultation and symmetric breath sounds. Heart:  RRR. No murmur or rub. Abdomen: Soft. No mass. No tenderness. No hernia. Normal bowel sounds.  Pfannenstiel incision.  She is more apple than pear. Rectal: Not done. Extremities:  Good strength and ROM  in upper and lower extremities. Neurologic:  Grossly intact to motor and sensory function. Psychiatric: Has normal mood and affect. Behavior is normal.   DATA REVIEWED: CXR - 11/23/2013  Ovidio Kin, MD,  [facility] [address] [place]  [address]   [place], [place]    [address] Phone:  [phone] FAX:  [phone]

## 2014-01-08 ENCOUNTER — Ambulatory Visit (INDEPENDENT_AMBULATORY_CARE_PROVIDER_SITE_OTHER): Payer: 59 | Admitting: Surgery

## 2014-01-08 LAB — TSH: TSH: 0.775 u[IU]/mL (ref 0.350–4.500)

## 2014-01-13 ENCOUNTER — Telehealth: Payer: Self-pay | Admitting: Pulmonary Disease

## 2014-01-13 DIAGNOSIS — G4733 Obstructive sleep apnea (adult) (pediatric): Secondary | ICD-10-CM

## 2014-01-13 NOTE — Telephone Encounter (Signed)
CPAP 12/30/13 >> CPAP 10 cm H2O >> AHI 1.1.  Will have my nurse inform pt that she did well during CPAP study.  Will have my nurse arrange for CPAP 10 cm H2O with heated humidity and mask of choice.  She will need ROV two months after CPAP set up.

## 2014-01-13 NOTE — Sleep Study (Signed)
[facility]  NAME: [name] DATE OF BIRTH:  1966/01/27 MEDICAL RECORD NUMBER [id number]  LOCATION: [facility]  PHYSICIAN: Coralyn HellingVineet Maybelline Kolarik, M.D. DATE OF STUDY: 12/30/2013  SLEEP STUDY TYPE: Nocturnal Polysomnogram               REFERRING PHYSICIAN: Coralyn HellingSood, Khaliah Barnick, MD  INDICATION FOR STUDY:  [age] year female with history of obstructive sleep apnea.  She had sleep study from 10/30/13 with AHI of 10, and SaO2 low of 76%.  She returns to the sleep lab for a CPAP titration study.  EPWORTH SLEEPINESS SCORE: 12. HEIGHT: 5\' 6"  (167.6 cm)  WEIGHT: 135.626 kg (299 lb)    Body mass index is 48.28 kg/(m^2).  NECK SIZE: 15 in.  MEDICATIONS:  Current Outpatient Prescriptions on File Prior to Visit  Medication Sig Dispense Refill  . albuterol (PROAIR HFA) 108 (90 BASE) MCG/ACT inhaler Inhale 2 puffs into the lungs every 6 (six) hours as needed for wheezing or shortness of breath.  1 Inhaler  3  . buPROPion (WELLBUTRIN XL) 300 MG 24 hr tablet Take 300 mg by mouth daily.      Marland Kitchen. escitalopram (LEXAPRO) 10 MG tablet Take 10 mg by mouth daily.      . fluticasone (FLONASE) 50 MCG/ACT nasal spray Place 2 sprays into the nose daily.      . fluticasone (FLOVENT HFA) 110 MCG/ACT inhaler Inhale 2 puffs into the lungs 2 (two) times daily.  1 Inhaler  12  . folic acid (FOLVITE) 400 MCG tablet Take 400 mcg by mouth daily.      . hydroxychloroquine (PLAQUENIL) 200 MG tablet Take 400 mg by mouth daily.      . Iron TABS Take 2 tablets by mouth daily.      . methotrexate (RHEUMATREX) 2.5 MG tablet Take 2.5 mg by mouth once a week. Take 3 tablets Tuesday and 2 tablets on Wednesday......Marland Kitchen.Caution:Chemotherapy. Protect from light.      . simvastatin (ZOCOR) 20 MG tablet Take 20 mg by mouth every evening.      . TRIAMTERENE-HCTZ PO Take by mouth. Take 37.5-12.5 mg daily       No current facility-administered medications on file prior to visit.    SLEEP ARCHITECTURE:  Total  recording time: 405 minutes.  Total sleep time was: 261 minutes.  Sleep efficiency: 64.4%.  Sleep latency: 39.5 minutes.  REM latency: 255 minutes.  Stage N1: 6.5%.  Stage N2: 56.5%.  Stage N3: 0%.  Stage R:  37%.  Supine sleep: 50 minutes.  Non-supine sleep: 211 minutes.  RESPIRATORY DATA: Average respiratory rate: 16. Snoring: Mild.  She was started on CPAP 5 and increased to 10 cm H2O.  With CPAP 10 cm H2O her AHI was reduced to 1.1 and she was observed in REM sleep.  OXYGEN DATA:  Baseline oxygenation: 94%. Lowest SaO2: 85%. Time spent below SaO2 90%: 0.9 minutes. Supplemental oxygen used: None.  CARDIAC DATA:  Average heart rate: 72 beats per minute. Rhythm strip: Sinus rhythm with occasional PVC's.  MOVEMENT/PARASOMNIA:  Periodic limb movement: 2.5.  Period limb movements with arousals: 0. Restroom trips: One.  IMPRESSION/ RECOMMENDATION:   She did well with CPAP 10 cm H2O.  She was fitted with Fisher Paykel Simplus medium size full face mask.   Additional therapies include weight loss, CPAP, oral appliance, or surgical evaluation.   Coralyn HellingVineet Kais Monje, M.D. Diplomate, Biomedical engineerAmerican Board of Sleep Medicine  ELECTRONICALLY SIGNED ON:  01/13/2014, 5:33 PM [facility] PH: [phone]   FX: [phone] ACCREDITED BY THE AMERICAN ACADEMY OF SLEEP MEDICINE  

## 2014-01-14 NOTE — Telephone Encounter (Signed)
lmtcb x1 

## 2014-01-14 NOTE — Telephone Encounter (Signed)
Pt is aware of results. Order has been placed. Recall will be placed for 2 month ROV. 

## 2014-01-22 ENCOUNTER — Other Ambulatory Visit (INDEPENDENT_AMBULATORY_CARE_PROVIDER_SITE_OTHER): Payer: Self-pay

## 2014-01-29 ENCOUNTER — Ambulatory Visit (HOSPITAL_COMMUNITY)
Admission: RE | Admit: 2014-01-29 | Discharge: 2014-01-29 | Disposition: A | Discharge status: Home / Self Care | Payer: 59 | Source: Ambulatory Visit | Attending: Surgery | Admitting: Surgery

## 2014-01-29 ENCOUNTER — Encounter (HOSPITAL_COMMUNITY)
Admission: RE | Disposition: A | Discharge status: Home / Self Care | Payer: Self-pay | Source: Ambulatory Visit | Attending: Surgery

## 2014-01-29 ENCOUNTER — Telehealth (INDEPENDENT_AMBULATORY_CARE_PROVIDER_SITE_OTHER): Payer: Self-pay

## 2014-01-29 HISTORY — PX: BREATH TEK H PYLORI: SHX5422

## 2014-01-29 SURGERY — BREATH TEST, FOR HELICOBACTER PYLORI

## 2014-01-29 NOTE — Telephone Encounter (Signed)
Prev Pak Bid x 14 days called to [facility] [address] ; patient aware

## 2014-01-29 NOTE — OR Nursing (Signed)
Breath tek results emailed to Dr. Ovidio Kinavid Newman - positive results.

## 2014-01-29 NOTE — Progress Notes (Signed)
01/29/14 0805  BREATH TEK ASSESSMENT  Referring MD Ovidio Kinavid Newman  Time of Last PO Intake 2230 (on 01/28/14)  Baseline Breath At: 0721  Pranactin Given At: 0724  Post-Dose Breath At: 0739  Sample 1 3.5  Sample 2 3.1  Test Postive

## 2014-01-30 ENCOUNTER — Ambulatory Visit (HOSPITAL_COMMUNITY)
Admission: RE | Admit: 2014-01-30 | Discharge: 2014-01-30 | Disposition: A | Discharge status: Home / Self Care | Payer: 59 | Source: Ambulatory Visit | Attending: Surgery | Admitting: Surgery

## 2014-01-30 ENCOUNTER — Telehealth (INDEPENDENT_AMBULATORY_CARE_PROVIDER_SITE_OTHER): Payer: Self-pay

## 2014-01-30 ENCOUNTER — Other Ambulatory Visit: Payer: Self-pay

## 2014-01-30 ENCOUNTER — Telehealth (INDEPENDENT_AMBULATORY_CARE_PROVIDER_SITE_OTHER): Payer: Self-pay | Admitting: *Deleted

## 2014-01-30 ENCOUNTER — Encounter (HOSPITAL_COMMUNITY): Payer: Self-pay | Admitting: Surgery

## 2014-01-30 DIAGNOSIS — Z6841 Body Mass Index (BMI) 40.0 and over, adult: Secondary | ICD-10-CM | POA: Insufficient documentation

## 2014-01-30 DIAGNOSIS — G4730 Sleep apnea, unspecified: Secondary | ICD-10-CM | POA: Insufficient documentation

## 2014-01-30 DIAGNOSIS — E049 Nontoxic goiter, unspecified: Secondary | ICD-10-CM | POA: Insufficient documentation

## 2014-01-30 DIAGNOSIS — F329 Major depressive disorder, single episode, unspecified: Secondary | ICD-10-CM | POA: Insufficient documentation

## 2014-01-30 DIAGNOSIS — D869 Sarcoidosis, unspecified: Secondary | ICD-10-CM | POA: Insufficient documentation

## 2014-01-30 DIAGNOSIS — I10 Essential (primary) hypertension: Secondary | ICD-10-CM | POA: Insufficient documentation

## 2014-01-30 DIAGNOSIS — F3289 Other specified depressive episodes: Secondary | ICD-10-CM | POA: Insufficient documentation

## 2014-01-30 DIAGNOSIS — J45909 Unspecified asthma, uncomplicated: Secondary | ICD-10-CM | POA: Insufficient documentation

## 2014-01-30 DIAGNOSIS — K7689 Other specified diseases of liver: Secondary | ICD-10-CM | POA: Insufficient documentation

## 2014-01-30 DIAGNOSIS — K219 Gastro-esophageal reflux disease without esophagitis: Secondary | ICD-10-CM | POA: Insufficient documentation

## 2014-01-30 NOTE — Telephone Encounter (Signed)
V/M Amoxicillin 500mg ,clarithromycin 500mg ,prevacid x 14days called [facility] [phone]. Advised for patient to call to confirm she P/U RX

## 2014-01-30 NOTE — Telephone Encounter (Signed)
Pt called, the medication that was called into her pharmacy, her insurance does not cover it.  She states it was going to cost her over $700 and she cannot afford that,  Please advise!  Victorino DikeJennifer

## 2014-02-18 ENCOUNTER — Other Ambulatory Visit (INDEPENDENT_AMBULATORY_CARE_PROVIDER_SITE_OTHER): Payer: Self-pay

## 2014-02-19 ENCOUNTER — Other Ambulatory Visit (INDEPENDENT_AMBULATORY_CARE_PROVIDER_SITE_OTHER): Payer: Self-pay

## 2014-03-08 ENCOUNTER — Encounter: Payer: 59 | Attending: Surgery | Admitting: Dietician

## 2014-03-08 ENCOUNTER — Encounter: Payer: Self-pay | Admitting: Dietician

## 2014-03-08 DIAGNOSIS — Z01818 Encounter for other preprocedural examination: Secondary | ICD-10-CM | POA: Insufficient documentation

## 2014-03-08 DIAGNOSIS — Z713 Dietary counseling and surveillance: Secondary | ICD-10-CM | POA: Insufficient documentation

## 2014-03-08 NOTE — Progress Notes (Signed)
  Pre-Op Assessment Visit:  Pre-Operative LAGB Surgery  Medical Nutrition Therapy:  Appt start time: 0830   End time:  0900.  Patient was seen on 03/08/2014 for Pre-Operative LAGB Nutrition Assessment. Assessment and letter of approval faxed to [facility] Bariatric Surgery Program coordinator on 03/08/2014.   Preferred Learning Style:  No preference indicated   Learning Readiness:  Ready  Handouts given during visit include:  Pre-Op Goals Bariatric Surgery Protein Shakes  Teaching Method Utilized: Visual Auditory Hands on  Barriers to learning/adherence to lifestyle change: none  Demonstrated degree of understanding via:  Teach Back   Patient to call the [facility] to enroll in Pre-Op and Post-Op Nutrition Education when surgery date is scheduled.

## 2014-03-16 ENCOUNTER — Other Ambulatory Visit: Payer: Self-pay | Admitting: Pulmonary Disease

## 2014-05-02 ENCOUNTER — Encounter (INDEPENDENT_AMBULATORY_CARE_PROVIDER_SITE_OTHER): Payer: Self-pay | Admitting: Surgery

## 2014-05-02 ENCOUNTER — Ambulatory Visit (INDEPENDENT_AMBULATORY_CARE_PROVIDER_SITE_OTHER): Payer: 59 | Admitting: Surgery

## 2014-05-02 NOTE — Progress Notes (Signed)
Re:   [name] DOB:   April 09, 1966 MRN:   [id number]  ASSESSMENT AND PLAN: 1.  Morbid obesity  Initial weight - 312, BMI - 50.4  Per the 1991 NIH Consensus Statement, the patient is a candidate for bariatric surgery.  The patient attended our initial information session and reviewed the types of bariatric surgery.    The patient is interested in the sleeve gastrectomy.  Note, she had originially wanted a lap band, but she has changed her mind.  I discussed with the patient the indications and risks of bariatric surgery.  The potential risks of surgery include, but are not limited to, bleeding, infection, leak from the bowel, DVT and PE, open surgery, long term nutrition consequences, and death.  The patient understands the importance of compliance and long term follow-up with our group after surgery.  Note she has gained 12 pounds since I saw.  We gave her a operative permit on sleeve gastrectomy.  I reinforced to her that she needs to loose weight.  Sharion is going to put in for an approval from [employer] for the sleeve gastrectomy.  2.  Sleep apnea  Restarted CPAP in March of 2015.  Sees Dr. Craige Cotta from pulmonary 3.  HTN x 10 years 4.  Depression  Followed by Dr. Hyman Hopes. 5.  Sarcoidosis  On Plaquenil and Methotrexate - Dr.Truslow  Will stop methotrexate one week before surgery 6.  Asthma  Chief Complaint  Patient presents with  . discuus sleeve   REFERRING PHYSICIAN: WEBB, CAROL, D, MD  HISTORY OF PRESENT ILLNESS: [name] is a 48 y.o. (DOB: [birth year])  AA  female whose primary care physician is WEBB, CAROL, D, MD (she was seeing Dr. Clyde Canterbury) and comes to me today for weight loss surgery.  Ms. Stella is here today to talk about a change in what she wants for  her weight loss surgery. Originally, she had considered a LAP-BAND, but now she wants to do as a sleeve gastrectomy. She has talked to several people about the band and the sleeve. She is not nursing going  back and forth for multiple appointments that may be required for LAP-BAND surgery. I think she's also seen better results with a sleeve gastrectomy. I also spent a good amount of time talking about her 12 pound weight gain since I last saw her. I told her she should increase her risk of any surgery performed and I strongly encouraged her to lose weight. She'll have some time before the scheduled surgery, any weight loss for her would be good.  UGI - 01/31/2104 - Mild gastroesophageal reflux. Abdominal US - 01/31/2104 - Suspected hepatic steatosis Psychiatry - 03/12/2014 - Dr. Cyndia Skeeters approved  History of weight loss efforts (March 2015): Patient has been to 2 information sessions about weight loss surgery. She knows at least one person at work who has had a LAP-BAND surgery.  She has tried Weight Watchers at least 3 times, but always gained her weight back. She has tried weight loss on her own without success. She has not tried medication for weight loss.    Past Medical History  Diagnosis Date  . Allergic rhinitis   . Anxiety   . Depression   . Hypertension   . Sarcoidosis     Pulmonary, skin involvement  . OSA (obstructive sleep apnea)        . Goiter   . Asthma          Past Surgical History  Procedure Laterality Date  . Abdominal hysterectomy    . Breath tek h pylori N/A 01/29/2014    Procedure: BREATH TEK H PYLORI;  Surgeon: Kandis Cockingavid H Dayani Winbush, MD;  [facility];  Service: General;  Laterality: N/A;  . Ovarian cyst removal       Current Outpatient Prescriptions  Medication Sig Dispense Refill  . buPROPion (WELLBUTRIN XL) 300 MG 24 hr tablet Take 300 mg by mouth daily.      Marland Kitchen. escitalopram (LEXAPRO) 10 MG tablet Take 10 mg by mouth daily.      . fluticasone (FLONASE) 50 MCG/ACT nasal spray Place 2 sprays into the nose daily.      . fluticasone (FLOVENT HFA) 110 MCG/ACT inhaler Inhale 2 puffs into the lungs 2 (two) times daily.  1 Inhaler  12  . folic acid (FOLVITE) 400 MCG  tablet Take 400 mcg by mouth daily.      . hydroxychloroquine (PLAQUENIL) 200 MG tablet Take 400 mg by mouth daily.      . Iron TABS Take 2 tablets by mouth daily.      . methotrexate (RHEUMATREX) 2.5 MG tablet Take 2.5 mg by mouth once a week. Take 3 tablets Tuesday and 2 tablets on Wednesday......Marland Kitchen.Caution:Chemotherapy. Protect from light.      . simvastatin (ZOCOR) 20 MG tablet Take 20 mg by mouth every evening.      . TRIAMTERENE-HCTZ PO Take by mouth. Take 37.5-12.5 mg daily      . VENTOLIN HFA 108 (90 BASE) MCG/ACT inhaler INHALE 2 PUFFS INTO THE LUNGS EVERY 6 (SIX) HOURS AS NEEDED FOR WHEEZING OR SHORTNESS OF BREATH.  18 each  3   No current facility-administered medications for this visit.     Allergies  Allergen Reactions  . Ibuprofen     REVIEW OF SYSTEMS: Skin:  Some skin changes, particularly of face, from sarcoid. Infection:  No history of hepatitis or HIV.  No history of MRSA. Neurologic:  No history of stroke.  No history of seizure.  No history of headaches. Cardiac:  Hypertension x 10 years. No history of heart disease.  No history of prior cardiac catheterization.  No history of seeing a cardiologist. Pulmonary:  History of smoking cigarettes about 15 years ago  She has sleep apnea.  She has asthma.  Lungs affected some by sarcoid. On CPAP - sees Dr. Clayton LefortV. Sood.  Endocrine:  No diabetes. No thyroid disease. Gastrointestinal:  No history of stomach disease.  No history of liver disease.  No history of gall bladder disease.  No history of pancreas disease.  She had a colonoscopy by Dr. Dulce Sellarutlaw in 2011. Urologic:  No history of kidney stones.  No history of bladder infections. GYN   Hysterectomy 2006 for benign disease. Musculoskeletal:  Back issues. Her hips also bother her, but she is not seeing ortho.  Has sarcoidosis,  followed by Dr. Kellie Simmeringruslow.  Hematologic:  No bleeding disorder.  No history of anemia.  Not anticoagulated. Psycho-social:  History of depression since her  parents passed away in 2005.  Dr. Cyndia SkeetersLurey approved  SOCIAL and FAMILY HISTORY: Unmarried. Lives with sister. 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  PHYSICAL EXAM: BP 124/78  Pulse 85  Ht 5\' 6"  (1.676 m)  Wt 312 lb (141.522 kg)  BMI 50.38 kg/m2  General: AA obese F who is alert and generally healthy appearing.  HEENT: Normal. Pupils equal. Neck: Supple. No mass.  No thyroid mass. Lymph Nodes:  No supraclavicular or cervical nodes. Lungs:  Clear to auscultation and symmetric breath sounds. Heart:  RRR. No murmur or rub. Abdomen: Soft. No mass. No tenderness. No hernia. Normal bowel sounds.  Pfannenstiel incision.  She is more apple than pear.  Rectal: Not done. Extremities:  Good strength and ROM  in upper and lower extremities. Neurologic:  Grossly intact to motor and sensory function. Psychiatric: Has normal mood and affect. Behavior is normal.   DATA REVIEWED: Labs and reports reviewed.  Ovidio Kin, MD,  [facility] [address] [place]  [address]   [place], [place]    [address] Phone:  [phone] FAX:  [phone]

## 2014-05-05 ENCOUNTER — Other Ambulatory Visit: Payer: Self-pay | Admitting: Pulmonary Disease

## 2014-05-13 ENCOUNTER — Encounter: Payer: 59 | Attending: Surgery

## 2014-05-13 DIAGNOSIS — Z713 Dietary counseling and surveillance: Secondary | ICD-10-CM | POA: Diagnosis present

## 2014-05-13 DIAGNOSIS — Z01818 Encounter for other preprocedural examination: Secondary | ICD-10-CM | POA: Diagnosis not present

## 2014-05-13 NOTE — Patient Instructions (Signed)
Follow:   Pre-Op Diet per MD 2 weeks prior to surgery  Phase 2- Liquids (clear/full) 2 weeks after surgery  Vitamin/Mineral/Calcium guidelines for purchasing bariatric supplements  Exercise guidelines pre and post-op per MD  Follow-up at [facility] in 2 weeks post-op for diet advancement.  

## 2014-05-13 NOTE — Progress Notes (Signed)
  Pre-Operative Nutrition Class:  Appt start time: 830   End time:  930.  Patient was seen on 05/13/2014 for Pre-Operative Bariatric Surgery Education at [facility].   Surgery date: No surgery date Surgery type: RYGB Start weight at [facility]: 303 lbs on 03/08/14 Weight today: 308.5 lbs   TANITA  BODY COMP RESULTS  05/13/14   BMI (kg/m^2) 49.8   Fat Mass (lbs) 182.5   Fat Free Mass (lbs) 126.0   Total Body Water (lbs) 92.0   Samples given per MNT protocol. Patient educated on appropriate usage: Bariatric Advantage Multivitamin Orange Complete Lot # [id number] Exp: 07/2014  BariActive Calcium Citrate +D3 Lot #[id number] Exp: 03/2015  Celebrate B12 Cherry Lot # [id number] Exp: 12/2014  Premier Protein Zonia Kief Lot #[id number] Exp: 02/21/2015  Renee Pain Protein Powder Chocolate Lot # [id number] Exp: 10/206   The following the learning objectives were met by the patient during this course:  Identify Pre-Op Dietary Goals and will begin 2 weeks pre-operatively  Identify appropriate sources of fluids and proteins   State protein recommendations and appropriate sources pre and post-operatively  Identify Post-Operative Dietary Goals and will follow for 2 weeks post-operatively  Identify appropriate multivitamin and calcium sources  Describe the need for physical activity post-operatively and will follow MD recommendations  State when to call healthcare provider regarding medication questions or post-operative complications  Handouts given during class include:  Pre-Op Bariatric Surgery Diet Handout  Protein Shake Handout  Post-Op Bariatric Surgery Nutrition Handout  BELT Program Information Flyer  Support Group Information Flyer  [facility] Bariatric Supplements Price List  Follow-Up Plan: Patient will follow-up at [facility] 2 weeks post operatively for diet advancement per MD.

## 2014-05-21 ENCOUNTER — Other Ambulatory Visit (INDEPENDENT_AMBULATORY_CARE_PROVIDER_SITE_OTHER): Payer: Self-pay | Admitting: Surgery

## 2014-05-23 ENCOUNTER — Encounter (HOSPITAL_COMMUNITY): Payer: Self-pay | Admitting: Pharmacy Technician

## 2014-05-27 NOTE — Patient Instructions (Signed)
[name]  05/27/2014   Your procedure is scheduled on:  06/04/14    Report to [facility] Main Entrance.  Follow the Signs to Short Stay Center at  0530      am  Call this number if you have problems the morning of surgery: [phone]   Remember:   Do not eat food or drink liquids after midnight.   Take these medicines the morning of surgery with A SIP OF WATER:    Do not wear jewelry, make-up or nail polish.  Do not wear lotions, powders, or perfumes. , deodorant    Do not shave 48 hours prior to surgery.   Do not bring valuables to the hospital.  Contacts, dentures or bridgework may not be worn into surgery.  Leave suitcase in the car. After surgery it may be brought to your room.  For patients admitted to the hospital, checkout time is 11:00 AM the day of  discharge.          Please read over the following fact sheets that you were given: [facility] Preparing [facility] Before surgery, you can play an important role.  Because skin is not sterile, your skin needs to be as free of germs as possible.  You can reduce the number of germs on your skin by washing with CHG (chlorahexidine gluconate) soap before surgery.  CHG is an antiseptic cleaner which kills germs and bonds with the skin to continue killing germs even after washing. Please DO NOT use if you have an allergy to CHG or antibacterial soaps.  If your skin becomes reddened/irritated stop using the CHG and inform your nurse when you arrive at Short Stay. Do not shave (including legs and underarms) for at least 48 hours prior to the first CHG shower.  You may shave your face/neck. Please follow these instructions carefully:  1.  Shower with CHG Soap the night before surgery and the  morning of Surgery.  2.  If you choose to wash your hair, wash your hair first as usual with your  normal  shampoo.  3.  After you shampoo, rinse your hair and body thoroughly to remove the  shampoo.                           4.  Use CHG as you  would any other liquid soap.  You can apply chg directly  to the skin and wash                       Gently with a scrungie or clean washcloth.  5.  Apply the CHG Soap to your body ONLY FROM THE NECK DOWN.   Do not use on face/ open                           Wound or open sores. Avoid contact with eyes, ears mouth and genitals (private parts).                       Wash face,  Genitals (private parts) with your normal soap.             6.  Wash thoroughly, paying special attention to the area where your surgery  will be performed.  7.  Thoroughly rinse your body with warm water from the neck down.  8.  DO NOT shower/wash with  your normal soap after using and rinsing off  the CHG Soap.                9.  Pat yourself dry with a clean towel.            10.  Wear clean pajamas.            11.  Place clean sheets on your bed the night of your first shower and do not  sleep with pets. Day of Surgery : Do not apply any lotions/deodorants the morning of surgery.  Please wear clean clothes to the hospital/surgery center.  FAILURE TO FOLLOW THESE INSTRUCTIONS MAY RESULT IN THE CANCELLATION OF YOUR SURGERY PATIENT SIGNATURE_________________________________  NURSE SIGNATURE__________________________________  ________________________________________________________________________ , coughing and deep breathing exercises, leg exercises

## 2014-05-29 ENCOUNTER — Encounter (HOSPITAL_COMMUNITY): Payer: Self-pay

## 2014-05-29 ENCOUNTER — Encounter (HOSPITAL_COMMUNITY)
Admission: RE | Admit: 2014-05-29 | Discharge: 2014-05-29 | Disposition: A | Discharge status: Home / Self Care | Payer: 59 | Source: Ambulatory Visit | Attending: Surgery | Admitting: Surgery

## 2014-05-29 DIAGNOSIS — Z01818 Encounter for other preprocedural examination: Secondary | ICD-10-CM | POA: Diagnosis present

## 2014-05-29 HISTORY — DX: Anemia, unspecified: D64.9

## 2014-05-29 HISTORY — DX: Gastro-esophageal reflux disease without esophagitis: K21.9

## 2014-05-29 HISTORY — DX: Unspecified osteoarthritis, unspecified site: M19.90

## 2014-05-29 LAB — CBC WITH DIFFERENTIAL/PLATELET
Basophils Absolute: 0 10*3/uL (ref 0.0–0.1)
Basophils Relative: 1 % (ref 0–1)
Eosinophils Absolute: 0.1 10*3/uL (ref 0.0–0.7)
Eosinophils Relative: 4 % (ref 0–5)
HCT: 36.3 % (ref 36.0–46.0)
Hemoglobin: 11.7 g/dL — ABNORMAL LOW (ref 12.0–15.0)
Lymphocytes Relative: 24 % (ref 12–46)
Lymphs Abs: 0.9 10*3/uL (ref 0.7–4.0)
MCH: 27.2 pg (ref 26.0–34.0)
MCHC: 32.2 g/dL (ref 30.0–36.0)
MCV: 84.4 fL (ref 78.0–100.0)
Monocytes Absolute: 0.4 10*3/uL (ref 0.1–1.0)
Monocytes Relative: 10 % (ref 3–12)
Neutro Abs: 2.4 10*3/uL (ref 1.7–7.7)
Neutrophils Relative %: 61 % (ref 43–77)
Platelets: 330 10*3/uL (ref 150–400)
RBC: 4.3 MIL/uL (ref 3.87–5.11)
RDW: 16.4 % — ABNORMAL HIGH (ref 11.5–15.5)
WBC: 3.8 10*3/uL — ABNORMAL LOW (ref 4.0–10.5)

## 2014-05-29 LAB — COMPREHENSIVE METABOLIC PANEL
ALT: 44 U/L — ABNORMAL HIGH (ref 0–35)
AST: 38 U/L — ABNORMAL HIGH (ref 0–37)
Albumin: 3.8 g/dL (ref 3.5–5.2)
Alkaline Phosphatase: 97 U/L (ref 39–117)
Anion gap: 11 (ref 5–15)
BUN: 18 mg/dL (ref 6–23)
CO2: 31 mEq/L (ref 19–32)
Calcium: 10.1 mg/dL (ref 8.4–10.5)
Chloride: 98 mEq/L (ref 96–112)
Creatinine, Ser: 1.07 mg/dL (ref 0.50–1.10)
GFR calc Af Amer: 69 mL/min — ABNORMAL LOW (ref >90)
GFR calc non Af Amer: 60 mL/min — ABNORMAL LOW (ref >90)
Glucose, Bld: 85 mg/dL (ref 70–99)
Potassium: 4.1 mEq/L (ref 3.7–5.3)
Sodium: 140 mEq/L (ref 137–147)
Total Bilirubin: 0.3 mg/dL (ref 0.3–1.2)
Total Protein: 7.3 g/dL (ref 6.0–8.3)

## 2014-05-29 NOTE — Progress Notes (Signed)
EKG- 01/30/14 EPIC  CXR 11/23/13 EPIC  LOV with Dr Craige CottaSood- 11/23/13 EPIC

## 2014-05-30 ENCOUNTER — Ambulatory Visit (INDEPENDENT_AMBULATORY_CARE_PROVIDER_SITE_OTHER): Payer: 59 | Admitting: Surgery

## 2014-06-03 NOTE — Anesthesia Preprocedure Evaluation (Addendum)
Anesthesia Evaluation  Patient identified by MRN, date of birth, ID band Patient awake    Reviewed: Allergy & Precautions, H&P , NPO status , Patient's Chart, lab work & pertinent test results  Airway Mallampati: III TM Distance: >3 FB Neck ROM: full    Dental  (+) Missing, Dental Advisory Given All upper front are missing:   Pulmonary asthma , sleep apnea and Continuous Positive Airway Pressure Ventilation , former smoker,  Pulmonary sarcoidosis breath sounds clear to auscultation  Pulmonary exam normal       Cardiovascular Exercise Tolerance: Good hypertension, Pt. on medications Rhythm:regular Rate:Normal  RBBB   Neuro/Psych negative neurological ROS  negative psych ROS   GI/Hepatic negative GI ROS, Neg liver ROS, GERD-  Medicated and Controlled,  Endo/Other  negative endocrine ROSMorbid obesity  Renal/GU negative Renal ROS  negative genitourinary   Musculoskeletal   Abdominal (+) + obese,   Peds  Hematology negative hematology ROS (+)   Anesthesia Other Findings   Reproductive/Obstetrics negative OB ROS                         Anesthesia Physical Anesthesia Plan  ASA: III  Anesthesia Plan: General   Post-op Pain Management:    Induction: Intravenous  Airway Management Planned: Oral ETT  Additional Equipment:   Intra-op Plan:   Post-operative Plan: Extubation in OR  Informed Consent: I have reviewed the patients History and Physical, chart, labs and discussed the procedure including the risks, benefits and alternatives for the proposed anesthesia with the patient or authorized representative who has indicated his/her understanding and acceptance.   Dental Advisory Given  Plan Discussed with: CRNA and Surgeon  Anesthesia Plan Comments:         Anesthesia Quick Evaluation

## 2014-06-04 ENCOUNTER — Encounter (HOSPITAL_COMMUNITY): Payer: Self-pay | Admitting: *Deleted

## 2014-06-04 ENCOUNTER — Inpatient Hospital Stay (HOSPITAL_COMMUNITY)
Admission: RE | Admit: 2014-06-04 | Discharge: 2014-06-06 | DRG: 621 | Disposition: A | Discharge status: Home / Self Care | Payer: 59 | Source: Ambulatory Visit | Attending: Surgery | Admitting: Surgery

## 2014-06-04 ENCOUNTER — Encounter (HOSPITAL_COMMUNITY): Payer: 59 | Admitting: Anesthesiology

## 2014-06-04 ENCOUNTER — Ambulatory Visit (HOSPITAL_COMMUNITY): Payer: 59 | Admitting: Anesthesiology

## 2014-06-04 ENCOUNTER — Encounter (HOSPITAL_COMMUNITY)
Admission: RE | Disposition: A | Discharge status: Home / Self Care | Payer: Self-pay | Source: Ambulatory Visit | Attending: Surgery

## 2014-06-04 DIAGNOSIS — Z01812 Encounter for preprocedural laboratory examination: Secondary | ICD-10-CM

## 2014-06-04 DIAGNOSIS — I10 Essential (primary) hypertension: Secondary | ICD-10-CM

## 2014-06-04 DIAGNOSIS — F329 Major depressive disorder, single episode, unspecified: Secondary | ICD-10-CM | POA: Diagnosis present

## 2014-06-04 DIAGNOSIS — Z79899 Other long term (current) drug therapy: Secondary | ICD-10-CM | POA: Diagnosis not present

## 2014-06-04 DIAGNOSIS — J45909 Unspecified asthma, uncomplicated: Secondary | ICD-10-CM | POA: Diagnosis present

## 2014-06-04 DIAGNOSIS — G4733 Obstructive sleep apnea (adult) (pediatric): Secondary | ICD-10-CM | POA: Diagnosis present

## 2014-06-04 DIAGNOSIS — K219 Gastro-esophageal reflux disease without esophagitis: Secondary | ICD-10-CM | POA: Diagnosis present

## 2014-06-04 DIAGNOSIS — R1013 Epigastric pain: Secondary | ICD-10-CM

## 2014-06-04 DIAGNOSIS — F3289 Other specified depressive episodes: Secondary | ICD-10-CM | POA: Diagnosis present

## 2014-06-04 DIAGNOSIS — Z6841 Body Mass Index (BMI) 40.0 and over, adult: Secondary | ICD-10-CM | POA: Diagnosis not present

## 2014-06-04 DIAGNOSIS — D869 Sarcoidosis, unspecified: Secondary | ICD-10-CM | POA: Diagnosis present

## 2014-06-04 DIAGNOSIS — K3189 Other diseases of stomach and duodenum: Secondary | ICD-10-CM | POA: Diagnosis present

## 2014-06-04 HISTORY — PX: UPPER GI ENDOSCOPY: SHX6162

## 2014-06-04 HISTORY — PX: LAPAROSCOPIC GASTRIC SLEEVE RESECTION: SHX5895

## 2014-06-04 LAB — HEMOGLOBIN AND HEMATOCRIT, BLOOD
HCT: 35.3 % — ABNORMAL LOW (ref 36.0–46.0)
Hemoglobin: 11.8 g/dL — ABNORMAL LOW (ref 12.0–15.0)

## 2014-06-04 SURGERY — GASTRECTOMY, SLEEVE, LAPAROSCOPIC
Anesthesia: General | Site: Abdomen

## 2014-06-04 MED ORDER — DEXTROSE 5 % IV SOLN
INTRAVENOUS | Status: AC
  Filled 2014-06-04: qty 2

## 2014-06-04 MED ORDER — ROCURONIUM BROMIDE 100 MG/10ML IV SOLN
INTRAVENOUS | Status: DC | PRN
  Administered 2014-06-04: 50 mg via INTRAVENOUS
  Administered 2014-06-04: 10 mg via INTRAVENOUS

## 2014-06-04 MED ORDER — UNJURY CHOCOLATE CLASSIC POWDER: 2.0000 [oz_av] | Freq: Four times a day (QID) | ORAL | Status: DC

## 2014-06-04 MED ORDER — PROPOFOL 10 MG/ML IV BOLUS
INTRAVENOUS | Status: AC
  Filled 2014-06-04: qty 20

## 2014-06-04 MED ORDER — MIDAZOLAM HCL 2 MG/2ML IJ SOLN
INTRAMUSCULAR | Status: AC
  Filled 2014-06-04: qty 2

## 2014-06-04 MED ORDER — LIDOCAINE HCL (CARDIAC) 20 MG/ML IV SOLN
INTRAVENOUS | Status: DC | PRN
  Administered 2014-06-04: 100 mg via INTRAVENOUS

## 2014-06-04 MED ORDER — TISSEEL VH 10 ML EX KIT
PACK | CUTANEOUS | Status: DC | PRN
  Administered 2014-06-04: 1

## 2014-06-04 MED ORDER — HEPARIN SODIUM (PORCINE) 5000 UNIT/ML IJ SOLN
5000.0000 [IU] | Freq: Three times a day (TID) | INTRAMUSCULAR | Status: DC
  Administered 2014-06-04 – 2014-06-06 (×6): 5000 [IU] via SUBCUTANEOUS
  Filled 2014-06-04 (×9): qty 1

## 2014-06-04 MED ORDER — UNJURY VANILLA POWDER: 2.0000 [oz_av] | Freq: Four times a day (QID) | ORAL | Status: DC

## 2014-06-04 MED ORDER — LACTATED RINGERS IV SOLN
INTRAVENOUS | Status: DC | PRN
  Administered 2014-06-04 (×2): via INTRAVENOUS

## 2014-06-04 MED ORDER — LACTATED RINGERS IR SOLN
Status: DC | PRN
  Administered 2014-06-04: 1000 mL

## 2014-06-04 MED ORDER — MIDAZOLAM HCL 5 MG/5ML IJ SOLN
INTRAMUSCULAR | Status: DC | PRN
  Administered 2014-06-04: 2 mg via INTRAVENOUS

## 2014-06-04 MED ORDER — CEFOXITIN SODIUM 2 G IV SOLR
INTRAVENOUS | Status: AC
  Filled 2014-06-04: qty 2

## 2014-06-04 MED ORDER — CEFOXITIN SODIUM 2 G IV SOLR
2.0000 g | INTRAVENOUS | Status: AC
  Administered 2014-06-04 (×2): 2 g via INTRAVENOUS

## 2014-06-04 MED ORDER — FENTANYL CITRATE 0.05 MG/ML IJ SOLN
INTRAMUSCULAR | Status: DC | PRN
  Administered 2014-06-04 (×2): 50 ug via INTRAVENOUS

## 2014-06-04 MED ORDER — HYDROMORPHONE HCL PF 1 MG/ML IJ SOLN
0.2500 mg | INTRAMUSCULAR | Status: DC | PRN
  Administered 2014-06-04 (×2): 0.5 mg via INTRAVENOUS

## 2014-06-04 MED ORDER — HYDROMORPHONE HCL PF 1 MG/ML IJ SOLN
INTRAMUSCULAR | Status: AC
  Filled 2014-06-04: qty 1

## 2014-06-04 MED ORDER — LACTATED RINGERS IV SOLN: INTRAVENOUS | Status: DC

## 2014-06-04 MED ORDER — NEOSTIGMINE METHYLSULFATE 10 MG/10ML IV SOLN
INTRAVENOUS | Status: AC
  Filled 2014-06-04: qty 1

## 2014-06-04 MED ORDER — GLYCOPYRROLATE 0.2 MG/ML IJ SOLN
INTRAMUSCULAR | Status: DC | PRN
  Administered 2014-06-04: 0.6 mg via INTRAVENOUS

## 2014-06-04 MED ORDER — POTASSIUM CHLORIDE IN NACL 20-0.45 MEQ/L-% IV SOLN
INTRAVENOUS | Status: DC
  Administered 2014-06-04: 150 mL/h via INTRAVENOUS
  Administered 2014-06-04 – 2014-06-05 (×2): via INTRAVENOUS
  Administered 2014-06-06: 150 mL/h via INTRAVENOUS
  Filled 2014-06-04 (×10): qty 1000

## 2014-06-04 MED ORDER — DEXAMETHASONE SODIUM PHOSPHATE 10 MG/ML IJ SOLN
INTRAMUSCULAR | Status: DC | PRN
  Administered 2014-06-04: 10 mg via INTRAVENOUS

## 2014-06-04 MED ORDER — HEPARIN SODIUM (PORCINE) 5000 UNIT/ML IJ SOLN
5000.0000 [IU] | INTRAMUSCULAR | Status: AC
  Administered 2014-06-04: 5000 [IU] via SUBCUTANEOUS
  Filled 2014-06-04: qty 1

## 2014-06-04 MED ORDER — TISSEEL VH 10 ML EX KIT
PACK | CUTANEOUS | Status: AC
Start: 2014-06-04 — End: 2014-06-04
  Filled 2014-06-04: qty 2

## 2014-06-04 MED ORDER — FENTANYL CITRATE 0.05 MG/ML IJ SOLN
INTRAMUSCULAR | Status: AC
  Filled 2014-06-04: qty 5

## 2014-06-04 MED ORDER — ALBUTEROL SULFATE (2.5 MG/3ML) 0.083% IN NEBU: 2.5000 mg | INHALATION_SOLUTION | Freq: Four times a day (QID) | RESPIRATORY_TRACT | Status: DC | PRN

## 2014-06-04 MED ORDER — CHLORHEXIDINE GLUCONATE 4 % EX LIQD: 60.0000 mL | Freq: Once | CUTANEOUS | Status: DC

## 2014-06-04 MED ORDER — MORPHINE SULFATE 2 MG/ML IJ SOLN
2.0000 mg | INTRAMUSCULAR | Status: DC | PRN
  Administered 2014-06-04: 2 mg via INTRAVENOUS
  Administered 2014-06-04: 4 mg via INTRAVENOUS
  Filled 2014-06-04: qty 2
  Filled 2014-06-04: qty 1

## 2014-06-04 MED ORDER — ONDANSETRON HCL 4 MG/2ML IJ SOLN
INTRAMUSCULAR | Status: DC | PRN
  Administered 2014-06-04: 4 mg via INTRAVENOUS

## 2014-06-04 MED ORDER — ACETAMINOPHEN 160 MG/5ML PO SOLN: 650.0000 mg | ORAL | Status: DC | PRN

## 2014-06-04 MED ORDER — SUCCINYLCHOLINE CHLORIDE 20 MG/ML IJ SOLN
INTRAMUSCULAR | Status: DC | PRN
  Administered 2014-06-04: 100 mg via INTRAVENOUS

## 2014-06-04 MED ORDER — UNJURY CHICKEN SOUP POWDER
2.0000 [oz_av] | Freq: Four times a day (QID) | ORAL | Status: DC
  Administered 2014-06-06: 2 [oz_av] via ORAL

## 2014-06-04 MED ORDER — PROPOFOL 10 MG/ML IV BOLUS
INTRAVENOUS | Status: DC | PRN
  Administered 2014-06-04: 200 mg via INTRAVENOUS

## 2014-06-04 MED ORDER — BUPIVACAINE HCL (PF) 0.25 % IJ SOLN
INTRAMUSCULAR | Status: AC
Start: 2014-06-04 — End: 2014-06-04
  Filled 2014-06-04: qty 30

## 2014-06-04 MED ORDER — NEOSTIGMINE METHYLSULFATE 10 MG/10ML IV SOLN
INTRAVENOUS | Status: DC | PRN
  Administered 2014-06-04: 5 mg via INTRAVENOUS

## 2014-06-04 MED ORDER — GLYCOPYRROLATE 0.2 MG/ML IJ SOLN
INTRAMUSCULAR | Status: AC
  Filled 2014-06-04: qty 3

## 2014-06-04 MED ORDER — FLUTICASONE PROPIONATE HFA 110 MCG/ACT IN AERO
2.0000 | INHALATION_SPRAY | Freq: Two times a day (BID) | RESPIRATORY_TRACT | Status: DC
  Administered 2014-06-04 – 2014-06-06 (×4): 2 via RESPIRATORY_TRACT
  Filled 2014-06-04: qty 12

## 2014-06-04 MED ORDER — PROMETHAZINE HCL 25 MG/ML IJ SOLN
INTRAMUSCULAR | Status: AC
  Filled 2014-06-04: qty 1

## 2014-06-04 MED ORDER — ACETAMINOPHEN 160 MG/5ML PO SOLN: 325.0000 mg | ORAL | Status: DC | PRN

## 2014-06-04 MED ORDER — ONDANSETRON HCL 4 MG/2ML IJ SOLN
4.0000 mg | INTRAMUSCULAR | Status: DC | PRN
  Administered 2014-06-05: 4 mg via INTRAVENOUS
  Filled 2014-06-04: qty 2

## 2014-06-04 MED ORDER — OXYCODONE HCL 5 MG/5ML PO SOLN: 5.0000 mg | ORAL | Status: DC | PRN

## 2014-06-04 MED ORDER — 0.9 % SODIUM CHLORIDE (POUR BTL) OPTIME
TOPICAL | Status: DC | PRN
  Administered 2014-06-04: 1000 mL

## 2014-06-04 MED ORDER — PROMETHAZINE HCL 25 MG/ML IJ SOLN
12.5000 mg | INTRAMUSCULAR | Status: DC | PRN
  Administered 2014-06-04: 12.5 mg via INTRAVENOUS

## 2014-06-04 SURGICAL SUPPLY — 57 items
APPLICATOR COTTON TIP 6IN STRL (MISCELLANEOUS) IMPLANT
APPLIER CLIP ROT 10 11.4 M/L (STAPLE)
APPLIER CLIP ROT 13.4 12 LRG (CLIP)
BLADE SURG 15 STRL LF DISP TIS (BLADE) ×2 IMPLANT
BLADE SURG 15 STRL SS (BLADE) ×1
CABLE HIGH FREQUENCY MONO STRZ (ELECTRODE) ×3 IMPLANT
CHLORAPREP W/TINT 26ML (MISCELLANEOUS) ×3 IMPLANT
CLIP APPLIE ROT 10 11.4 M/L (STAPLE) IMPLANT
CLIP APPLIE ROT 13.4 12 LRG (CLIP) IMPLANT
DERMABOND ADVANCED (GAUZE/BANDAGES/DRESSINGS) ×1
DERMABOND ADVANCED .7 DNX12 (GAUZE/BANDAGES/DRESSINGS) ×2 IMPLANT
DEVICE SUTURE ENDOST 10MM (ENDOMECHANICALS) IMPLANT
DEVICE TROCAR PUNCTURE CLOSURE (ENDOMECHANICALS) IMPLANT
DISSECTOR BLUNT TIP ENDO 5MM (MISCELLANEOUS) ×3 IMPLANT
DRAPE CAMERA CLOSED 9X96 (DRAPES) ×3 IMPLANT
DRAPE UTILITY XL STRL (DRAPES) ×6 IMPLANT
ELECT REM PT RETURN 9FT ADLT (ELECTROSURGICAL) ×3
ELECTRODE REM PT RTRN 9FT ADLT (ELECTROSURGICAL) ×2 IMPLANT
GAUZE SPONGE 4X4 12PLY STRL (GAUZE/BANDAGES/DRESSINGS) IMPLANT
GLOVE SURG SIGNA 7.5 PF LTX (GLOVE) ×3 IMPLANT
GOWN STRL REUS W/TWL XL LVL3 (GOWN DISPOSABLE) ×12 IMPLANT
HOVERMATT SINGLE USE (MISCELLANEOUS) ×3 IMPLANT
KIT BASIN OR (CUSTOM PROCEDURE TRAY) ×3 IMPLANT
NEEDLE SPNL 22GX3.5 QUINCKE BK (NEEDLE) ×3 IMPLANT
PACK UNIVERSAL I (CUSTOM PROCEDURE TRAY) ×3 IMPLANT
PEN SKIN MARKING BROAD (MISCELLANEOUS) ×3 IMPLANT
RELOAD STAPLER BLUE 60MM (STAPLE) ×4 IMPLANT
RELOAD STAPLER GOLD 60MM (STAPLE) ×4 IMPLANT
RELOAD STAPLER GREEN 60MM (STAPLE) ×4 IMPLANT
SCISSORS LAP 5X35 DISP (ENDOMECHANICALS) ×3 IMPLANT
SEALANT SURGICAL APPL DUAL CAN (MISCELLANEOUS) ×3 IMPLANT
SET IRRIG TUBING LAPAROSCOPIC (IRRIGATION / IRRIGATOR) ×3 IMPLANT
SHEARS CURVED HARMONIC AC 45CM (MISCELLANEOUS) ×3 IMPLANT
SLEEVE ADV FIXATION 5X100MM (TROCAR) ×3 IMPLANT
SLEEVE GASTRECTOMY 36FR VISIGI (MISCELLANEOUS) ×3 IMPLANT
SOLUTION ANTI FOG 6CC (MISCELLANEOUS) ×3 IMPLANT
SPONGE LAP 18X18 X RAY DECT (DISPOSABLE) ×3 IMPLANT
STAPLE ECHEON FLEX 60 POW ENDO (STAPLE) ×3 IMPLANT
STAPLER RELOAD BLUE 60MM (STAPLE) ×6
STAPLER RELOAD GOLD 60MM (STAPLE) ×6
STAPLER RELOAD GREEN 60MM (STAPLE) ×6
SUT ETHILON 2 0 PS N (SUTURE) IMPLANT
SUT MON AB 5-0 PS2 18 (SUTURE) ×3 IMPLANT
SUT VIC AB 0 CT1 27 (SUTURE) ×1
SUT VIC AB 0 CT1 27XBRD ANTBC (SUTURE) ×2 IMPLANT
SYRINGE 20CC LL (MISCELLANEOUS) ×3 IMPLANT
TOWEL OR 17X26 10 PK STRL BLUE (TOWEL DISPOSABLE) ×3 IMPLANT
TOWEL OR NON WOVEN STRL DISP B (DISPOSABLE) ×3 IMPLANT
TRAY FOLEY CATH 14FRSI W/METER (CATHETERS) ×3 IMPLANT
TROCAR ADV FIXATION 12X100MM (TROCAR) ×3 IMPLANT
TROCAR ADV FIXATION 5X100MM (TROCAR) ×3 IMPLANT
TROCAR BLADELESS 15MM (ENDOMECHANICALS) ×3 IMPLANT
TROCAR BLADELESS OPT 5 100 (ENDOMECHANICALS) ×3 IMPLANT
TROCAR XCEL 12X100 BLDLESS (ENDOMECHANICALS) IMPLANT
TUBING CONNECTING 10 (TUBING) ×3 IMPLANT
TUBING ENDO SMARTCAP PENTAX (MISCELLANEOUS) ×3 IMPLANT
TUBING FILTER THERMOFLATOR (ELECTROSURGICAL) ×3 IMPLANT

## 2014-06-04 NOTE — H&P (Signed)
Re: [name]  DOB: [birth year]  MRN: [id number]   ASSESSMENT AND PLAN:  1. Morbid obesity   Initial weight - 312, BMI - 50.4   Per the 1991 NIH Consensus Statement, the patient is a candidate for bariatric surgery. The patient attended our initial information session and reviewed the types of bariatric surgery.   The patient is interested in the sleeve gastrectomy. Note, she had originially wanted a lap band, but she has changed her mind. I discussed with the patient the indications and risks of bariatric surgery. The potential risks of surgery include, but are not limited to, bleeding, infection, leak from the bowel, DVT and PE, open surgery, long term nutrition consequences, and death. The patient understands the importance of compliance and long term follow-up with our group after surgery.   Note she has gained 12 pounds since I saw.   We gave her a operative permit on sleeve gastrectomy.   I reinforced to her that she needs to loose weight. Sharion is going to put in for an approval from [employer] for the sleeve gastrectomy.   Her sister is here with her today gpt surgery.  2. Sleep apnea   Restarted CPAP in March of 2015.   Sees Dr. Craige Cotta from pulmonary  3. HTN x 10 years  4. Depression  Followed by Dr. Hyman Hopes.  5. Sarcoidosis   On Plaquenil and Methotrexate - Dr.Truslow   Will stop methotrexate one week before surgery  6. Asthma   Chief Complaint   Patient presents with   .  discuus sleeve    REFERRING PHYSICIAN: WEBB, CAROL, D, MD   HISTORY OF PRESENT ILLNESS:  [name] is a 48 y.o. (DOB: 03/21/1966) AA female whose primary care physician is WEBB, CAROL, D, MD (she was seeing Dr. Clyde Canterbury) and comes to me today for weight loss surgery.  Ms. Crumbley is here today to talk about a change in what she wants for her weight loss surgery. Originally, she had considered a LAP-BAND, but now she wants to do as a sleeve gastrectomy. She has talked to several people about the  band and the sleeve. She is not nursing going back and forth for multiple appointments that may be required for LAP-BAND surgery. I think she's also seen better results with a sleeve gastrectomy.  I also spent a good amount of time talking about her 12 pound weight gain since I last saw her. I told her she should increase her risk of any surgery performed and I strongly encouraged her to lose weight. She'll have some time before the scheduled surgery, any weight loss for her would be good.  UGI - 01/31/2104 - Mild gastroesophageal reflux.  Abdominal US - 01/31/2104 - Suspected hepatic steatosis  Psychiatry - 03/12/2014 - Dr. Cyndia Skeeters approved   History of weight loss efforts (March 2015):  Patient has been to 2 information sessions about weight loss surgery. She knows at least one person at work who has had a LAP-BAND surgery.  She has tried Weight Watchers at least 3 times, but always gained her weight back. She has tried weight loss on her own without success. She has not tried medication for weight loss.   Past Medical History   Diagnosis  Date   .  Allergic rhinitis    .  Anxiety    .  Depression    .  Hypertension    .  Sarcoidosis      Pulmonary, skin involvement   .  OSA (obstructive sleep apnea)        .  Goiter    .  Asthma         Past Surgical History   Procedure  Laterality  Date   .  Abdominal hysterectomy     .  Breath tek h pylori  N/A  01/29/2014     Procedure: BREATH TEK H PYLORI; Surgeon: Kandis Cocking, MD; Location: [facility]; Service: General; Laterality: N/A;   .  Ovarian cyst removal      Current Outpatient Prescriptions   Medication  Sig  Dispense  Refill   .  buPROPion (WELLBUTRIN XL) 300 MG 24 hr tablet  Take 300 mg by mouth daily.     Marland Kitchen  escitalopram (LEXAPRO) 10 MG tablet  Take 10 mg by mouth daily.     .  fluticasone (FLONASE) 50 MCG/ACT nasal spray  Place 2 sprays into the nose daily.     .  fluticasone (FLOVENT HFA) 110 MCG/ACT inhaler  Inhale 2 puffs  into the lungs 2 (two) times daily.  1 Inhaler  12   .  folic acid (FOLVITE) 400 MCG tablet  Take 400 mcg by mouth daily.     .  hydroxychloroquine (PLAQUENIL) 200 MG tablet  Take 400 mg by mouth daily.     .  Iron TABS  Take 2 tablets by mouth daily.     .  methotrexate (RHEUMATREX) 2.5 MG tablet  Take 2.5 mg by mouth once a week. Take 3 tablets Tuesday and 2 tablets on Wednesday......Marland KitchenCaution:Chemotherapy. Protect from light.     .  simvastatin (ZOCOR) 20 MG tablet  Take 20 mg by mouth every evening.     .  TRIAMTERENE-HCTZ PO  Take by mouth. Take 37.5-12.5 mg daily     .  VENTOLIN HFA 108 (90 BASE) MCG/ACT inhaler  INHALE 2 PUFFS INTO THE LUNGS EVERY 6 (SIX) HOURS AS NEEDED FOR WHEEZING OR SHORTNESS OF BREATH.  18 each  3    No current facility-administered medications for this visit.    Allergies   Allergen  Reactions   .  Ibuprofen     REVIEW OF SYSTEMS:  Skin: Some skin changes, particularly of face, from sarcoid.  Infection: No history of hepatitis or HIV. No history of MRSA.  Neurologic: No history of stroke. No history of seizure. No history of headaches.  Cardiac: Hypertension x 10 years. No history of heart disease. No history of prior cardiac catheterization. No history of seeing a cardiologist.  Pulmonary: History of smoking cigarettes about 15 years ago She has sleep apnea. She has asthma. Lungs affected some by sarcoid. On CPAP - sees Dr. Clayton Lefort.  Endocrine: No diabetes. No thyroid disease.  Gastrointestinal: No history of stomach disease. No history of liver disease. No history of gall bladder disease. No history of pancreas disease. She had a colonoscopy by Dr. Dulce Sellar in 2010/02/20.  Urologic: No history of kidney stones. No history of bladder infections.  GYN Hysterectomy 02-20-2005 for benign disease.  Musculoskeletal: Back issues. Her hips also bother her, but she is not seeing ortho. Has sarcoidosis, followed by Dr. Kellie Simmering.  Hematologic: No bleeding disorder. No history of  anemia. Not anticoagulated.  Psycho-social: History of depression since her parents passed away in 02-21-04. Dr. Cyndia Skeeters approved   SOCIAL and FAMILY HISTORY:  Unmarried.  Lives with sister.  Works as Diplomatic Services operational officer for [employer].   PHYSICAL EXAM:  BP 130/87  Pulse 96  Temp(Src) 98.4  F (36.9 C) (Oral)  Resp 18  Ht  (1.676 m)  Wt 296 lb 2 oz (134.321 kg)  BMI 47.82 kg/m2  SpO2 100%  Office visit - 05/02/2014 - Wt 312 lb (141.522 kg)  BMI 50.38 kg/m2  General: AA obese F who is alert and generally healthy appearing.  HEENT: Normal. Pupils equal.  Neck: Supple. No mass. No thyroid mass.  Lymph Nodes: No supraclavicular or cervical nodes.  Lungs: Clear to auscultation and symmetric breath sounds.  Heart: RRR. No murmur or rub.  Abdomen: Soft. No mass. No tenderness. No hernia. Normal bowel sounds. Pfannenstiel incision. She is more apple than pear.  Rectal: Not done.  Extremities: Good strength and ROM in upper and lower extremities.  Neurologic: Grossly intact to motor and sensory function.  Psychiatric: Has normal mood and affect. Behavior is normal.  DATA REVIEWED:  Labs and reports reviewed.  Ovidio Kin, MD, [facility]  [address] [place]  [place], [place] [address]  Phone: [phone] FAX: [phone]

## 2014-06-04 NOTE — Transfer of Care (Signed)
Immediate Anesthesia Transfer of Care Note  Patient: [name]  Procedure(s) Performed: Procedure(s): LAPAROSCOPIC GASTRIC SLEEVE RESECTION (N/A) UPPER GI ENDOSCOPY  Patient Location: PACU  Anesthesia Type:General  Level of Consciousness: sedated  Airway & Oxygen Therapy: Patient Spontanous Breathing and Patient connected to face mask oxygen  Post-op Assessment: Report given to PACU RN and Post -op Vital signs reviewed and stable  Post vital signs: Reviewed and stable  Complications: No apparent anesthesia complications

## 2014-06-04 NOTE — Op Note (Signed)
[name] [id number] 19-Apr-1966 06/04/2014  Preoperative diagnosis: morbid obesity  Postoperative diagnosis: Same   Procedure: Esophagogastroduodenoscopy   Surgeon: Mary Sella. [name] M.D., FACS   Anesthesia: Gen.   Indications for procedure: 48year old female undergoing Laparoscopic Gastric Sleeve Resection and an EGD was requested to evaluate the new gastric sleeve.   Description of procedure: After we have completed the sleeve resection, I scrubbed out and obtained the Olympus endoscope. I gently placed endoscope in the patient's oropharynx and gently glided it down the esophagus without any difficulty under direct visualization. Once I was in the gastric sleeve, I insufflated the stomach with air. I was able to cannulate and advanced the scope through the gastric sleeve. I was able to cannulate the duodenum with ease. Dr. Ezzard Standing had placed saline in the upper abdomen. Upon further insufflation of the gastric sleeve there was no evidence of bubbles. GE junction located at 40 cm.  Upon further inspection of the gastric sleeve, the mucosa appeared normal. There is no evidence of any mucosal abnormality. The sleeve was widely patent at the angularis. There was no evidence of bleeding. The gastric sleeve was decompressed. The scope was withdrawn. The patient tolerated this portion of the procedure well. Please see Dr Allene Pyo operative note for details regarding the laparoscopic gastric sleeve resection.   Mary Sella. Andrey Campanile, MD, FACS  General, Bariatric, & Minimally Invasive Surgery  [facility], [place]

## 2014-06-04 NOTE — Anesthesia Postprocedure Evaluation (Signed)
  Anesthesia Post-op Note  Patient: [name]  Procedure(s) Performed: Procedure(s) (LRB): LAPAROSCOPIC GASTRIC SLEEVE RESECTION (N/A) UPPER GI ENDOSCOPY  Patient Location: PACU  Anesthesia Type: General  Level of Consciousness: awake and alert   Airway and Oxygen Therapy: Patient Spontanous Breathing  Post-op Pain: mild  Post-op Assessment: Post-op Vital signs reviewed, Patient's Cardiovascular Status Stable, Respiratory Function Stable, Patent Airway and No signs of Nausea or vomiting  Last Vitals:  Filed Vitals:   06/04/14 1045  BP: 146/95  Pulse: 74  Temp: 36.8 C  Resp: 14    Post-op Vital Signs: stable   Complications: No apparent anesthesia complications

## 2014-06-04 NOTE — Op Note (Signed)
PATIENT:   [name] DOB:   April 22, 1966 MRN:   [id number]  DATE OF PROCEDURE: 06/04/2014                   FACILITY:  [facility]  OPERATIVE REPORT  PREOPERATIVE DIAGNOSIS:  Morbid obesity.  POSTOPERATIVE DIAGNOSIS:  Morbid obesity (weight 312, BMI of 50.4).  PROCEDURE:  Laparoscopic Sleeve gastrectomy (intraoperative upper endoscopy by Dr. Fredonia Highland)  SURGEON:  Sandria Bales. Ezzard Standing, MD  FIRST ASSISTANTFredonia Highland, MD  ANESTHESIA:  General endotracheal.  Anesthesiologist: Gaetano Hawthorne, MD CRNA: Illene Silver, CRNA; Doran Clay, CRNA  General  ESTIMATED BLOOD LOSS:  Minimal.  LOCAL ANESTHESIA:  30 cc of 1/4% Marcaine  COMPLICATIONS:  None.  INDICATION FOR SURGERY:  [name] is a 48 y.o. AA  female who sees WEBB, CAROL, D, MD as her primary care doctor.  She has completed our preoperative bariatric program and now comes for a laparoscopic Sleeve gastrectomy.  She had originally wanted a lap band, but changed her mind.  She understood well the sleeve gastrectomy.  The indications, potential complications of surgery were explained to the patient.  Potential complications of the surgery include, but are not limited to, bleeding, infection, DVT, open surgery, and long-term nutritional consequences.  OPERATIVE NOTE:  The patient taken to room #1 at [facility] where [name] underwent a general endotracheal anesthetic, supervised by Anesthesiologist: Gaetano Hawthorne, MD CRNA: Illene Silver, CRNA; Doran Clay, CRNA.  The patient was given 2 g of cefoxitin at the beginning of the procedure.  A time-out was held and surgical checklist run.  I accessed her abdominal cavity through the left upper quadrant with a 5 mm Optiview. I did an abdominal exploration.   Her omentum and bowel were unremarkable. The right and left lobes of the liver unremarkable. Gallbladder was normal. Her stomach was unremarkable.   I placed a total of 6 trocars. I placed a 5 mm left lateral trocar, a 5 mm left  paramedian trocar (for the scope), a 12 mm right paramedian torcar (converted to a 15 mm to extract the stomach), a 5 mm right subcostal trocar, and 5 mm subxiphoid trocar for the liver retractor.  I started out taking down the greater curvature attachment. I measured approximately 6 cm proximal from the pylorus and I started my dissection taking down the greater curvature of the stomach with the Harmonic Scalpel at that point. I took this dissection around her stomach to the angle of His and the left crus.   After I had mobilized the greater curvature of the stomach, I then passed the 36 French ViSiGi bougie which was used to suck the stomach up against the lesser curvature and placed into the antrum. During the staple firing,  I tried to give the ViSiGi a cuff at least about 1 cm. I tried to avoid narrowing the incisura. I used a total of 7 staple firings.  From antrum to the angle of His I used 2 green, 1 gold and 3 blue Eschelon 60 mm Ethicon staplers.  At each firing of the EndoGIA stapler, I inspected the stomach, the anterior wall of the stomach, and underneath to make sure there was no compromise or impingement on to the ViSiGi bougie.   The staple line seemed linear without any corkscrewing of the stomach. Hemostasis was good. I did not use any reinforcement. She did have at least 3 areas of bleeding which I used clips  to clip on the new greater curvature of the stomach.  Because I thought we had a good staple line, I then had the ViSiGi was converted to insufflate the pouch. A new stomach pouch was placed under water. There was no bubbling or leak noted.   At this point, Dr. Andrey Campanile broke scrub and passed an upper endoscope down through the esophagus into the stomach pouch. The stomach was tubular. There was no narrowing of the stomach pouch or angulation. We were easily able to pass the endoscope into the antrum and again put air pressure on the staple line. I inflated the upper abdomen with  saline. There was no bubbling or evidence of air leak. The mucosa looked viable.  Dr. Andrey Campanile decompressed the stomach with the endoscopy.  I converted to right paramedian trocar to a 15 trocar and extracted the stomach remnant through this intact and sent this to Pathology. I then placed 5 cc ofTisseel along the new greater curvature staple line, covered the entire staple line with the Tisseel, and aspirated out the saline that I had irrigated because I thought the staple line looked viable and complete. There was no evidence of leak. I did not leave a drain in place.    Then, I closed the trocar sites. I placed 2-0 Vicryl sutures at the 15-mm port site in the right paramedian incision. The other port sites seemed smaller not requiring sutures. I closed the skin at each site with a 5-0 Monocryl, painted each wound with Dermabond. The patient was transported to recovery room in good condition. Sponge and needle count were correct at the end of the case.   Ovidio Kin, MD, [facility] Pager: [phone] Office phone:  [phone]

## 2014-06-05 ENCOUNTER — Inpatient Hospital Stay (HOSPITAL_COMMUNITY): Payer: 59

## 2014-06-05 DIAGNOSIS — Z09 Encounter for follow-up examination after completed treatment for conditions other than malignant neoplasm: Secondary | ICD-10-CM

## 2014-06-05 LAB — CBC WITH DIFFERENTIAL/PLATELET
Basophils Absolute: 0 K/uL (ref 0.0–0.1)
Basophils Relative: 0 % (ref 0–1)
Eosinophils Absolute: 0 K/uL (ref 0.0–0.7)
Eosinophils Relative: 0 % (ref 0–5)
HCT: 37 % (ref 36.0–46.0)
Hemoglobin: 11.9 g/dL — ABNORMAL LOW (ref 12.0–15.0)
Lymphocytes Relative: 7 % — ABNORMAL LOW (ref 12–46)
Lymphs Abs: 0.8 K/uL (ref 0.7–4.0)
MCH: 27.7 pg (ref 26.0–34.0)
MCHC: 32.2 g/dL (ref 30.0–36.0)
MCV: 86 fL (ref 78.0–100.0)
Monocytes Absolute: 0.7 K/uL (ref 0.1–1.0)
Monocytes Relative: 6 % (ref 3–12)
Neutro Abs: 10.3 K/uL — ABNORMAL HIGH (ref 1.7–7.7)
Neutrophils Relative %: 87 % — ABNORMAL HIGH (ref 43–77)
Platelets: 304 K/uL (ref 150–400)
RBC: 4.3 MIL/uL (ref 3.87–5.11)
RDW: 16 % — ABNORMAL HIGH (ref 11.5–15.5)
WBC: 11.9 K/uL — ABNORMAL HIGH (ref 4.0–10.5)

## 2014-06-05 LAB — HEMOGLOBIN AND HEMATOCRIT, BLOOD
HCT: 37.4 % (ref 36.0–46.0)
Hemoglobin: 11.8 g/dL — ABNORMAL LOW (ref 12.0–15.0)

## 2014-06-05 MED ORDER — PANTOPRAZOLE SODIUM 40 MG IV SOLR
40.0000 mg | Freq: Two times a day (BID) | INTRAVENOUS | Status: DC
  Administered 2014-06-05 – 2014-06-06 (×2): 40 mg via INTRAVENOUS
  Filled 2014-06-05 (×3): qty 40

## 2014-06-05 MED ORDER — IOHEXOL 300 MG/ML  SOLN
50.0000 mL | Freq: Once | INTRAMUSCULAR | Status: AC | PRN
  Administered 2014-06-05: 20 mL via ORAL

## 2014-06-05 NOTE — Progress Notes (Signed)
Bilateral lower extremity venous duplex completed:  No obvious evidence of DVT, superficial thrombosis, or Baker's cyst.  Technically difficult study due to the patient's body habitus.   

## 2014-06-05 NOTE — Care Management Note (Signed)
    Page 1 of 1   06/05/2014     1:17:27 PM CARE MANAGEMENT NOTE 06/05/2014  Patient:  [name], [name]   Account Number:  [id number]  Date Initiated:  06/05/2014  Documentation initiated by:  Lorenda Ishihara  Subjective/Objective Assessment:   48 yo female admitted s/p Laparoscopic Sleeve gastrectomy. PTA lived at home with sister.     Action/Plan:   Home when stable   Anticipated DC Date:  06/07/2014   Anticipated DC Plan:  HOME/SELF CARE      DC Planning Services  CM consult      Choice offered to / List presented to:             Status of service:  Completed, signed off Medicare Important Message given?   (If response is "NO", the following Medicare IM given date fields will be blank) Date Medicare IM given:   Medicare IM given by:   Date Additional Medicare IM given:   Additional Medicare IM given by:    Discharge Disposition:  HOME/SELF CARE  Per UR Regulation:  Reviewed for med. necessity/level of care/duration of stay  If discussed at Long Length of Stay Meetings, dates discussed:    Comments:

## 2014-06-05 NOTE — Progress Notes (Signed)
Nutrition Education Note  Received consult for diet education per DROP protocol.   Discussed 2 week post op diet with pt. Emphasized that liquids must be non carbonated, non caffeinated, and sugar free. Fluid goals discussed. Pt to follow up with outpatient bariatric RD for further diet progression after 2 weeks. Multivitamins and minerals also reviewed. Teach back method used, pt expressed understanding, expect good compliance. Pt c/o heartburn pain, recently given medication for this. Otherwise no complaints.    Diet: First 2 Weeks  You will see the nutritionist about two (2) weeks after your surgery. The nutritionist will increase the types of foods you can eat if you are handling liquids well:  If you have severe vomiting or nausea and cannot handle clear liquids lasting longer than 1 day, call your surgeon  Protein Shake  Drink at least 2 ounces of shake 5-6 times per day  Each serving of protein shakes (usually 8 - 12 ounces) should have a minimum of:  15 grams of protein  And no more than 5 grams of carbohydrate  Goal for protein each day:  Men = 80 grams per day  Women = 60 grams per day  Protein powder may be added to fluids such as non-fat milk or Lactaid milk or Soy milk (limit to 35 grams added protein powder per serving)   Hydration  Slowly increase the amount of water and other clear liquids as tolerated (See Acceptable Fluids)  Slowly increase the amount of protein shake as tolerated  Sip fluids slowly and throughout the day  May use sugar substitutes in small amounts (no more than 6 - 8 packets per day; i.e. Splenda)   Fluid Goal  The first goal is to drink at least 8 ounces of protein shake/drink per day (or as directed by the nutritionist); some examples of protein shakes are [employer], [employer], EAS Edge HP, and Unjury. See handout from pre-op Bariatric Education Class:  Slowly increase the amount of protein shake you drink as tolerated  You may find it  easier to slowly sip shakes throughout the day  It is important to get your proteins in first  Your fluid goal is to drink 64 - 100 ounces of fluid daily  It may take a few weeks to build up to this  32 oz (or more) should be clear liquids  And  32 oz (or more) should be full liquids (see below for examples)  Liquids should not contain sugar, caffeine, or carbonation   Clear Liquids:  Water or Sugar-free flavored water (i.e. Fruit H2O, Propel)  Decaffeinated coffee or tea (sugar-free)  Crystal Lite, Wyler's Lite, Minute Maid Lite  Sugar-free Jell-O  Bouillon or broth  Sugar-free Popsicle: *Less than 20 calories each; Limit 1 per day   Full Liquids:  Protein Shakes/Drinks + 2 choices per day of other full liquids  Full liquids must be:  No More Than 12 grams of Carbs per serving  No More Than 3 grams of Fat per serving  Strained low-fat cream soup  Non-Fat milk  Fat-free Lactaid Milk  Sugar-free yogurt (Dannon Lite & Fit, Greek yogurt)     Leedey MS, RD, [place] [phone] Pager [phone] Weekend/After Hours Pager

## 2014-06-05 NOTE — Progress Notes (Signed)
General Surgery Note  LOS: 1 day  POD -  1 Day Post-Op  Assessment/Plan: 1.  LAPAROSCOPIC GASTRIC SLEEVE RESECTION, UPPER GI ENDOSCOPY - 06/04/2014 - D. Dionysios Massman   For swallow today.  2.  Some reflux early 3. Sleep apnea   Restarted CPAP in March of 2015.   Sees Dr. Craige Cotta from pulmonary  4. HTN x 10 years  5. Depression   Followed by Dr. Hyman Hopes.  6. Sarcoidosis   On Plaquenil and Methotrexate - Dr.Truslow   Will stop methotrexate one week before surgery  7. Asthma 8.  DVT prophylaxis - SQ Heparin   Active Problems:   Morbid obesity, initial weight - 300, BMI - 48.5  Subjective:  Complains of some reflux.  Otherwise, she did well last PM. Her sister is in the room with her. Objective:   Filed Vitals:   06/05/14 0630  BP: 136/67  Pulse: 65  Temp: 99.1 F (37.3 C)  Resp: 16     Intake/Output from previous day:  08/25 0701 - 08/26 0700 In: 4022.5 [I.V.:4022.5] Out: 2130 [Urine:2130]  Intake/Output this shift:      Physical Exam:   General: WN obese AA F who is alert and oriented.    HEENT: Normal. Pupils equal. .   Lungs: Clear.  She pulls 1,500 cc on IS   Abdomen: Soft.  Rare BS.   Wound: Clean      Lab Results:    Recent Labs  06/04/14 1700 06/05/14 0507  WBC  --  11.9*  HGB 11.8* 11.9*  HCT 35.3* 37.0  PLT  --  304    BMET  No results found for this basename: NA, K, CL, CO2, GLUCOSE, BUN, CREATININE, CALCIUM,  in the last 72 hours  PT/INR  No results found for this basename: LABPROT, INR,  in the last 72 hours  ABG  No results found for this basename: PHART, PCO2, PO2, HCO3,  in the last 72 hours   Studies/Results:  No results found.   Anti-infectives:   Anti-infectives   Start     Dose/Rate Route Frequency Ordered Stop   06/04/14 0550  cefOXitin (MEFOXIN) 2 g in dextrose 5 % 50 mL IVPB     2 g 100 mL/hr over 30 Minutes Intravenous On call to O.R. 06/04/14 0550 06/04/14 0940      Ovidio Kin, MD, FACS Pager: [phone] [facility] [place]  [facility]gery Office: [phone] 06/05/2014

## 2014-06-06 ENCOUNTER — Encounter (HOSPITAL_COMMUNITY): Payer: Self-pay | Admitting: Surgery

## 2014-06-06 LAB — CBC WITH DIFFERENTIAL/PLATELET
Basophils Absolute: 0.1 10*3/uL (ref 0.0–0.1)
Basophils Relative: 1 % (ref 0–1)
Eosinophils Absolute: 0 10*3/uL (ref 0.0–0.7)
Eosinophils Relative: 1 % (ref 0–5)
HCT: 32.8 % — ABNORMAL LOW (ref 36.0–46.0)
Hemoglobin: 10.6 g/dL — ABNORMAL LOW (ref 12.0–15.0)
Lymphocytes Relative: 22 % (ref 12–46)
Lymphs Abs: 1.4 10*3/uL (ref 0.7–4.0)
MCH: 27.7 pg (ref 26.0–34.0)
MCHC: 32.3 g/dL (ref 30.0–36.0)
MCV: 85.9 fL (ref 78.0–100.0)
Monocytes Absolute: 0.6 10*3/uL (ref 0.1–1.0)
Monocytes Relative: 9 % (ref 3–12)
Neutro Abs: 4.5 10*3/uL (ref 1.7–7.7)
Neutrophils Relative %: 67 % (ref 43–77)
Platelets: 272 10*3/uL (ref 150–400)
RBC: 3.82 MIL/uL — ABNORMAL LOW (ref 3.87–5.11)
RDW: 16 % — ABNORMAL HIGH (ref 11.5–15.5)
WBC: 6.6 10*3/uL (ref 4.0–10.5)

## 2014-06-06 MED ORDER — PANTOPRAZOLE SODIUM 40 MG PO TBEC: 40.0000 mg | DELAYED_RELEASE_TABLET | Freq: Every day | ORAL | Status: DC

## 2014-06-06 NOTE — Progress Notes (Signed)
General Surgery Note  LOS: 2 days  POD -  2 Days Post-Op  Assessment/Plan: 1.  LAPAROSCOPIC GASTRIC SLEEVE RESECTION, UPPER GI ENDOSCOPY - 06/04/2014 - D. [employer] showed no leak, but slow transit with reflux.  LE dopplers okay.  Doing better this am, less reflux.  She wants to try to go home today.  2.  Some reflux early 3. Sleep apnea   Restarted CPAP in March of 2015.   Sees Dr. Craige Cotta from pulmonary  4. HTN x 10 years  5. Depression   Followed by Dr. Hyman Hopes.  6. Sarcoidosis   On Plaquenil and Methotrexate - Dr.Truslow   Will stop methotrexate one week before surgery  7. Asthma 8.  DVT prophylaxis - SQ Heparin   Active Problems:   Morbid obesity, initial weight - 300, BMI - 48.5  Subjective:  Doing better.  To advance diet.  Plan D/C if tolerates drinks. Her sister is in the room with her. Objective:   Filed Vitals:   06/06/14 0555  BP: 126/73  Pulse: 72  Temp: 98.6 F (37 C)  Resp: 16     Intake/Output from previous day:  08/26 0701 - 08/27 0700 In: 3550 [P.O.:60; I.V.:3490] Out: 2000 [Urine:2000]  Intake/Output this shift:      Physical Exam:   General: WN obese AA F who is alert and oriented.    HEENT: Normal. Pupils equal. .   Lungs: Clear.     Abdomen: Soft.  BS present.   Wound: Clean     Lab Results:     Recent Labs  06/05/14 0507 06/05/14 1615 06/06/14 0451  WBC 11.9*  --  6.6  HGB 11.9* 11.8* 10.6*  HCT 37.0 37.4 32.8*  PLT 304  --  272    BMET  No results found for this basename: NA, K, CL, CO2, GLUCOSE, BUN, CREATININE, CALCIUM,  in the last 72 hours  PT/INR  No results found for this basename: LABPROT, INR,  in the last 72 hours  ABG  No results found for this basename: PHART, PCO2, PO2, HCO3,  in the last 72 hours   Studies/Results:  Dg Ugi W/water Sol Cm  06/05/2014   CLINICAL DATA:  Postop gastric sleeve, gastric reflux  EXAM: WATER SOLUBLE UPPER GI SERIES  TECHNIQUE: Single-column upper GI series was performed using  water soluble contrast.  CONTRAST:  20mL OMNIPAQUE IOHEXOL 300 MG/ML  SOLN  COMPARISON:  01/30/2014  FLUOROSCOPY TIME:  1 min  FINDINGS: Scout radiograph demonstrates surgical sutures and clips related to recent surgery in the left upper abdomen.  Initial swallow demonstrates early filling of the proximal gastric cardia followed by immediate moderate to severe gastroesophageal reflux.  Subsequently, the remaining stomach and proximal small bowel does fill in a delayed fashion. This delay likely reflects some degree of postoperative edema.  No evidence of leak.  IMPRESSION: No evidence of leak.  Postoperative edema with delayed filling of the distal stomach and proximal small bowel.  Moderate to severe gastroesophageal reflux.   Electronically Signed   By: Charline Bills M.D.   On: 06/05/2014 09:46     Anti-infectives:   Anti-infectives   Start     Dose/Rate Route Frequency Ordered Stop   06/04/14 0550  cefOXitin (MEFOXIN) 2 g in dextrose 5 % 50 mL IVPB     2 g 100 mL/hr over 30 Minutes Intravenous On call to O.R. 06/04/14 0550 06/04/14 0940      Ovidio Kin, MD, FACS Pager:  [phone] [facility] [place] [facility] Office: [phone] 06/06/2014

## 2014-06-06 NOTE — Discharge Summary (Signed)
Physician Discharge Summary  Patient ID:  [name]  MRN: [id number]  DOB/AGE: 49-03-1966 49 y.o.  Admit date: 06/04/2014 Discharge date: 06/06/2014  Discharge Diagnoses:  1.  Morbid obesity  2. Some reflux early  3. Sleep apnea   Restarted CPAP in March of 2015.   Sees Dr. Craige Cotta from pulmonary  4. HTN x 10 years  5. Depression   Followed by Dr. Hyman Hopes.  6. Sarcoidosis   On Plaquenil and Methotrexate - Dr.Truslow   Will stop methotrexate one week before surgery  7. Asthma   Active Problems:   Morbid obesity, initial weight - 300, BMI - 48.5   Operation: Procedure(s):  LAPAROSCOPIC GASTRIC SLEEVE RESECTION UPPER GI ENDOSCOPY on 06/04/2014 - D. Ezzard Standing  Discharged Condition: good  Hospital Course: [name] is an 48 y.o. female whose primary care physician is Shirlean Mylar, D, MD and who was admitted 06/04/2014 with a chief complaint of Morbid obesity.   She was brought to the operating room on 06/04/2014 and underwent  LAPAROSCOPIC GASTRIC SLEEVE RESECTION UPPER GI ENDOSCOPY.  Her sister stayed with her while she was in the hosptial. She had reflux early after surgery.  Her swallow showed some delayed gastric emptying.  I placed her on Protonix, she increased her ambulation, and she is bette today. She will start the protein drinks and if tolerated, she should be ready to go home. She is ready for discharge.  The discharge instructions were reviewed with the patient.  Consults: None  Significant Diagnostic Studies: Results for orders placed during the hospital encounter of 06/04/14  HEMOGLOBIN AND HEMATOCRIT, BLOOD      Result Value Ref Range   Hemoglobin 11.8 (*) 12.0 - 15.0 g/dL   HCT 40.9 (*) 81.1 - 91.4 %  CBC WITH DIFFERENTIAL      Result Value Ref Range   WBC 11.9 (*) 4.0 - 10.5 K/uL   RBC 4.30  3.87 - 5.11 MIL/uL   Hemoglobin 11.9 (*) 12.0 - 15.0 g/dL   HCT 78.2  95.6 - 21.3 %   MCV 86.0  78.0 - 100.0 fL   MCH 27.7  26.0 - 34.0 pg   MCHC 32.2  30.0 - 36.0  g/dL   RDW 08.6 (*) 57.8 - 46.9 %   Platelets 304  150 - 400 K/uL   Neutrophils Relative % 87 (*) 43 - 77 %   Neutro Abs 10.3 (*) 1.7 - 7.7 K/uL   Lymphocytes Relative 7 (*) 12 - 46 %   Lymphs Abs 0.8  0.7 - 4.0 K/uL   Monocytes Relative 6  3 - 12 %   Monocytes Absolute 0.7  0.1 - 1.0 K/uL   Eosinophils Relative 0  0 - 5 %   Eosinophils Absolute 0.0  0.0 - 0.7 K/uL   Basophils Relative 0  0 - 1 %   Basophils Absolute 0.0  0.0 - 0.1 K/uL  HEMOGLOBIN AND HEMATOCRIT, BLOOD      Result Value Ref Range   Hemoglobin 11.8 (*) 12.0 - 15.0 g/dL   HCT 62.9  52.8 - 41.3 %  CBC WITH DIFFERENTIAL      Result Value Ref Range   WBC 6.6  4.0 - 10.5 K/uL   RBC 3.82 (*) 3.87 - 5.11 MIL/uL   Hemoglobin 10.6 (*) 12.0 - 15.0 g/dL   HCT 24.4 (*) 01.0 - 27.2 %   MCV 85.9  78.0 - 100.0 fL   MCH 27.7  26.0 - 34.0 pg  [facility] LCAdvanMarland KitchencedKermit Baloes829Lockheed MartinBADTEXTTAG>AdvanMarland KitchencedKermit Baloesl CLockheed Martin47mer 3661HongLAreVald ADTEXTTAG>59rmiLAreSwan Surgery Dba Northern [facility] Surgery Center LLCEber [phone]  PabloArelia SneddonLoleta [id number] MartinMarland KitchenKermit BaloAdvanced Micro DevicesCreola CornEastern Niagara HospitalEber [phone]  Guilford CenterArelia SneddonLoleta Rose53768mLockheed MartinMarland KitchenKermit BaloAdvanced Micro DevicesCreola CornGarden Park Medical CenterEber [phone]  ReeseArelia SneddonLoleta [id number] MartinMarland KitchenKermit BaloAdvanced Micro DevicesCreola CornUnm Children'S Psychiatric CenterEber [phone]  GreenbrierArelia SneddonLoleta [id number] MartinMarland KitchenKermit BaloAdvanced Micro DevicesCreola CornAndroscoggin Valley HospitalEber Hong802 [phone]  ParksvilleArelia SneddonLoleta [id number] Martin  tablet  Generic drug:  hydroxychloroquine  Take 400 mg by mouth daily.     simvastatin 20 MG tablet  Commonly known as:  ZOCOR  Take  20 mg by mouth at bedtime.     triamterene-hydrochlorothiazide 37.5-25 MG per tablet  Commonly known as:  MAXZIDE-25  Take 0.5 tablets by mouth at bedtime.        Disposition: 01-Home or Self Care      Discharge Instructions   Increase activity slowly    Complete by:  As directed           Return to work on:  In 3 or 4 weeks  Activity:  Driving - May drive in 4 or 5 days, if doing well and on no pain med.   Lifting - Talk it easy for 7 days, then no limit  Wound Care:   May shower  Diet:  Post sleeve gastrectomy diet  Follow up appointment:  Call Dr. Allene Pyo office [facility]) at [phone] for an appointment in 2 to 3 weeks.       We are going on a new computer system, so have patience with our office.     Signed: Ovidio Kin, M.D., [facility] Surgery Office:  [phone]  06/06/2014, 7:19 AM

## 2014-06-06 NOTE — Progress Notes (Signed)
Patient alert and oriented, pain is controlled. Patient is tolerating fluids, advanced to protein shake today, patient tolerating well with no nausea.  Reviewed Gastric sleeve discharge instructions with patient and patient is able to articulate understanding.  Provided information on BELT program, Support Group and [facility] outpatient pharmacy. All questions answered, will continue to monitor.

## 2014-06-06 NOTE — Discharge Instructions (Signed)
[facility] [place] [facility] - DISCHARGE INSTRUCTIONS TO PATIENT  Return to work on:  In 3 or 4 weeks  Activity:  Driving - May drive in 4 or 5 days, if doing well and on no pain med.   Lifting - Talk it easy for 7 days, then no limit  Wound Care:   May shower  Diet:  Post sleeve gastrectomy diet  Follow up appointment:  Call Dr. Allene Pyo office [facility]) at [phone] for an appointment in 2 to 3 weeks.       We are going on a new computer system, so have patience with our office.  Medications and dosages:  Resume your home medications.  You have a prescription for:  Oxycodone and protonix  Call Dr. Ezzard Standing or his office  [phone]) if you have:  Temperature greater than 100.4,  Persistent nausea and vomiting,  Severe uncontrolled pain,  Redness, tenderness, or signs of infection (pain, swelling, redness, odor or green/yellow discharge around the site),  Difficulty breathing, headache or visual disturbances,  Any other questions or concerns you may have after discharge.  In an emergency, call 911 or go to an Emergency Department at a nearby hospital.       GASTRIC BYPASS/SLEEVE  Home Care Instructions   These instructions are to help you care for yourself when you go home.  Call: If you have any problems.   Call [phone] and ask for the surgeon on call   If you need immediate assistance come to the ER at [facility]. Tell the ER staff you are a new post-op gastric bypass or gastric sleeve patient  Signs and symptoms to report:   Severe  vomiting or nausea o If you cannot handle clear liquids for longer than 1 day, call your surgeon   Abdominal pain which does not get better after taking your pain medication   Fever greater than 100.4  F and chills   Heart rate over 100 beats a minute   Trouble breathing   Chest pain   Redness,  swelling, drainage, or foul odor at incision (surgical) sites   If your incisions open or pull apart   Swelling or pain  in calf (lower leg)   Diarrhea (Loose bowel movements that happen often), frequent watery, uncontrolled bowel movements   Constipation, (no bowel movements for 3 days) if this happens: o Take Milk of Magnesia, 2 tablespoons by mouth, 3 times a day for 2 days if needed o Stop taking Milk of Magnesia once you have had a bowel movement o Call your doctor if constipation continues Or o Take Miralax  (instead of Milk of Magnesia) following the label instructions o Stop taking Miralax once you have had a bowel movement o Call your doctor if constipation continues   Anything you think is abnormal for you   Normal side effects after surgery:   Unable to sleep at night or unable to concentrate   Irritability   Being tearful (crying) or depressed  These are common complaints, possibly related to your anesthesia, stress of surgery, and change in lifestyle, that usually go away a few weeks after surgery. If these feelings continue, call your medical doctor.  Wound Care: You may have surgical glue, steri-strips, or staples over your incisions after surgery   Surgical glue: Looks like clear film over your incisions and will wear off a little at a time   Steri-strips: Adhesive strips of tape over your incisions. You may notice a yellowish color on skin  under the steri-strips. This is used to make the steri-strips stick better. Do not pull the steri-strips off - let them fall off   Staples: Staples may be removed before you leave the hospital o If you go home with staples, call [facility] for an appointment with your surgeons nurse to have staples removed 10 days after surgery, [phone]   Showering: You may shower two (2) days after your surgery unless your surgeon tells you differently o Wash gently around incisions with warm soapy water, rinse well, and gently pat dry o If you have a drain (tube from your incision), you may need someone to hold this while you shower o No tub baths  until staples are removed and incisions are healed   Medications:   Medications should be liquid or crushed if larger than the size of a dime   Extended release pills (medication that releases a little bit at a time through the  day) should not be crushed   Depending on the size and number of medications you take, you may need to space (take a few throughout the day)/change the time you take your medications so that you do not over-fill your pouch (smaller stomach)   Make sure you follow-up with you primary care physician to make medication changes needed during rapid weight loss and life -style changes   If you have diabetes, follow up with your doctor that orders your diabetes medication(s) within one week after surgery and check your blood sugar regularly    Do not drive while taking narcotics (pain medications)    Do not take acetaminophen (Tylenol) and Roxicet or Lortab Elixir at the same time since these pain medications contain acetaminophen   Diet:  First 2 Weeks You will see the nutritionist about two (2) weeks after your surgery. The nutritionist will increase the types of foods you can eat if you are handling liquids well:   If you have severe vomiting or nausea and cannot handle clear liquids lasting longer than 1 day call your surgeon Protein Shake   Drink at least 2 ounces of shake 5-6 times per day   Each serving of protein shakes (usually 8-12 ounces) should have a minimum of: o 15 grams of protein o And no more than 5 grams of carbohydrate   Goal for protein each day: o Men = 80 grams per day o Women = 60 grams per day      Protein powder may be added to fluids such as non-fat milk or Lactaid milk or Soy milk (limit to 35 grams added protein powder per serving)  Hydration   Slowly increase the amount of water and other clear liquids as tolerated (See Acceptable Fluids)   Slowly increase the amount of protein shake as tolerated   Sip fluids slowly and throughout the day    May use sugar substitutes in small amounts (no more than 6-8 packets per day; i.e. Splenda)  Fluid Goal   The first goal is to drink at least 8 ounces of protein shake/drink per day (or as directed by the nutritionist); some examples of protein shakes are [employer], Dillard's, EAS Edge HP, and Unjury. - See handout from pre-op Bariatric Education Class: o Slowly increase the amount of protein shake you drink as tolerated o You may find it easier to slowly sip shakes throughout the day o It is important to get your proteins in first   Your fluid goal is to drink 64-100 ounces  of fluid daily o It may take a few weeks to build up to this    32 oz. (or more) should be clear liquids And   32 oz. (or more) should be full liquids (see below for examples)   Liquids should not contain sugar, caffeine, or carbonation  Clear Liquids:   Water of Sugar-free flavored water (i.e. Fruit HO, Propel)   Decaffeinated coffee or tea (sugar-free)   Crystal lite, Wylers Lite, Minute Maid Lite   Sugar-free Jell-O   Bouillon or broth   Sugar-free Popsicle:    - Less than 20 calories each; Limit 1 per day  Full Liquids:                   Protein Shakes/Drinks + 2 choices per day of other full liquids   Full liquids must be: o No More Than 12 grams of Carbs per serving o No More Than 3 grams of Fat per serving   Strained low-fat cream soup   Non-Fat milk   Fat-free Lactaid Milk   Sugar-free yogurt (Dannon Lite & Fit, Greek yogurt)    Vitamins and Minerals   Start 1 day after surgery unless otherwise directed by your surgeon   2 Chewable Multivitamin / Multimineral Supplement with iron (i.e. Centrum for Adults)   Vitamin B-12, 350-500 micrograms sub-lingual (place tablet under the tongue) each day   Chewable Calcium Citrate with Vitamin D-3 (Example: 3 Chewable Calcium  Plus 600 with Vitamin D-3) o Take 500 mg three (3) times a day for a total of 1500 mg each day o Do not take all 3 doses  of calcium at one time as it may cause constipation, and you can only absorb 500 mg at a time o Do not mix multivitamins containing iron with calcium supplements;  take 2 hours apart o Do not substitute Tums (calcium carbonate) for your calcium   Menstruating women and those at risk for anemia ( a blood disease that causes weakness) may need extra iron o Talk to your doctor to see if you need more iron   If you need extra iron: Total daily Iron recommendation (including Vitamins) is 50 to 100 mg Iron/day   Do not stop taking or change any vitamins or minerals until you talk to your nutritionist or surgeon   Your nutritionist and/or surgeon must approve all vitamin and mineral supplements   Activity and Exercise: It is important to continue walking at home. Limit your physical activity as instructed by your doctor. During this time, use these guidelines:   Do not lift anything greater than ten  (10) pounds for at least two (2) weeks   Do not go back to work or drive until Designer, industrial/product says you can   You may have sex when you feel comfortable o It is VERY important for female patients to use a reliable birth control method; fertility often increase after surgery o Do not get pregnant for at least 18 months   Start exercising as soon as your doctor tells you that you can o Make sure your doctor approves any physical activity   Start with a simple walking program   Walk 5-15 minutes each day, 7 days per week   Slowly increase until you are walking 30-45 minutes per day   Consider joining our BELT program. [phone] or email [email or web address]   Special Instructions Things to remember:   Free counseling is available for you and your family through collaboration between [facility]  and [employer]. Please call [phone] and leave a message   Use your CPAP when sleeping if this applies to you   Consider buying a medical alert bracelet that says you had lap-band surgery     You will likely have your  first fill (fluid added to your band) 6 - 8 weeks after surgery   [facility] has a free Bariatric Surgery Support Group that meets monthly, the 3rd Thursday, 6pm. Calvert Cantor. You can see classes online at [email or web address]   It is very important to keep all follow up appointments with your surgeon, nutritionist, primary care physician, and behavioral health practitioner o After the first year, please follow up with your bariatric surgeon and nutritionist at least once a year in order to maintain best weight loss results                    [facility]:  [phone]               [facility]: [phone]               Bariatric Nurse Coordinator: [phone]  Gastric Bypass/Sleeve Home Care Instructions  Rev. 11/2012                                                         Reviewed and Endorsed                                                    by [facility] Patient Education Committee, Jan, 2014

## 2014-06-10 ENCOUNTER — Telehealth (HOSPITAL_COMMUNITY): Payer: Self-pay

## 2014-06-11 NOTE — Telephone Encounter (Signed)
Made discharge phone call to patient per DROP protocol. Asking the following questions.    1. Do you have someone to care for you now that you are home?  yes 2. Are you having pain now that is not relieved by your pain medication?  no 3. Are you able to drink the recommended daily amount of fluids (48 ounces minimum/day) and protein (60-80 grams/day) as prescribed by the dietitian or nutritional counselor?  Yes, working on it 4. Are you taking the vitamins and minerals as prescribed?  yes 5. Do you have the "on call" number to contact your surgeon if you have a problem or question?  yes 6. Are your incisions free of redness, swelling or drainage? (If steri strips, address that these can fall off, shower as tolerated) yes 7. Have your bowels moved since your surgery?  If not, are you passing gas?  yes 8. Are you up and walking 3-4 times per day?  yes    1. Do you have an appointment made to see your surgeon in the next month?  yes 2. Were you provided your discharge medications before your surgery or before you were discharged from the hospital and are you taking them without problem?  yes 3. Were you provided phone numbers to the clinic/surgeon's office?  yes 4. Did you watch the patient education video module in the (clinic, surgeon's office, etc.) before your surgery? yes 5. Do you have a discharge checklist that was provided to you in the hospital to reference with instructions on how to take care of yourself after surgery?  yes 6. Did you see a dietitian or nutritional counselor while you were in the hospital?  yes 7. Do you have an appointment to see a dietitian or nutritional counselor in the next month?  yes   

## 2014-06-18 ENCOUNTER — Encounter: Payer: 59 | Attending: Surgery

## 2014-06-18 DIAGNOSIS — Z713 Dietary counseling and surveillance: Secondary | ICD-10-CM | POA: Insufficient documentation

## 2014-06-18 DIAGNOSIS — Z01818 Encounter for other preprocedural examination: Secondary | ICD-10-CM | POA: Diagnosis not present

## 2014-06-18 NOTE — Progress Notes (Signed)
Bariatric Class:  Appt start time: 1530 end time:  1630.  2 Week Post-Operative Nutrition Class  Patient was seen on 06/18/2014 for Post-[facility]ative [facility]   Surgery date: 06/04/2014 Surgery type: Sleeve Start weight at [facility]: 308.5 lbs on 05/13/2014 Weight today: 278.0 lbs Weight change: 30.5 lbs   TANITA  BODY COMP RESULTS  05/13/14 06/18/14   BMI (kg/m^2) 49.8 44.9   Fat Mass (lbs) 182.5 164.0   Fat Free Mass (lbs) 126.0 114.0   Total Body Water (lbs) 92.0 83.5    The following the learning objectives were met by the patient during this course:  Identifies Phase 3A (Soft, High Proteins) Dietary Goals and will begin from 2 weeks post-operatively to 2 months post-operatively  Identifies appropriate sources of fluids and proteins   States protein recommendations and appropriate sources post-operatively  Identifies the need for appropriate texture modifications, mastication, and bite sizes when consuming solids  Identifies appropriate multivitamin and calcium sources post-operatively  Describes the need for physical activity post-operatively and will follow MD recommendations  States when to call healthcare provider regarding medication questions or post-operative complications  Handouts given during class include:  Phase 3A: Soft, High Protein Diet Handout  Follow-Up Plan: Patient will follow-up at [facility] in 6 weeks for 2 month post-op nutrition visit for diet advancement per MD.

## 2014-06-18 NOTE — Patient Instructions (Signed)
Patient to follow Phase 3A-Soft, High Protein Diet and follow-up at [facility] in 6 weeks for 2 months post-op nutrition visit for diet advancement. 

## 2014-06-21 ENCOUNTER — Ambulatory Visit (INDEPENDENT_AMBULATORY_CARE_PROVIDER_SITE_OTHER): Payer: 59 | Admitting: Surgery

## 2014-06-26 ENCOUNTER — Ambulatory Visit (INDEPENDENT_AMBULATORY_CARE_PROVIDER_SITE_OTHER): Payer: 59 | Admitting: Surgery

## 2014-06-26 ENCOUNTER — Other Ambulatory Visit (INDEPENDENT_AMBULATORY_CARE_PROVIDER_SITE_OTHER): Payer: Self-pay

## 2014-07-31 ENCOUNTER — Ambulatory Visit: Payer: 59 | Admitting: Dietician

## 2014-08-02 ENCOUNTER — Ambulatory Visit: Payer: 59 | Admitting: Dietician

## 2014-08-05 ENCOUNTER — Encounter: Payer: 59 | Attending: Surgery | Admitting: Dietician

## 2014-08-05 DIAGNOSIS — Z713 Dietary counseling and surveillance: Secondary | ICD-10-CM | POA: Insufficient documentation

## 2014-08-05 DIAGNOSIS — Z6841 Body Mass Index (BMI) 40.0 and over, adult: Secondary | ICD-10-CM | POA: Insufficient documentation

## 2014-08-05 NOTE — Progress Notes (Signed)
  Follow-up visit:  8 Weeks Post-Operative Sleeve Gastrectomy Surgery  Medical Nutrition Therapy:  Appt start time: 1730 end time:  1745.  Primary concerns today: Post-operative Bariatric Surgery Nutrition Management. Overall feeling well. Tolerating the foods she is eating. Lost 24 lbs of fat mass since previous visit. Water tastes "nasty" to her now.   Has had some potatoes and sweet drinks.  Surgery date: 06/04/2014  Surgery type: Sleeve  [facility]: 308.5 lbs on 05/13/2014  Weight today: 265.5  lbs Weight change: 12.5 lbs, 24 lbs of fat mass lost Total weight loss: 43 lbs   TANITA BODY COMP RESULTS   05/13/14  06/18/14  08/05/14  BMI (kg/m^2)  49.8  44.9  42.9  Fat Mass (lbs)  182.5  164.0  140.0  Fat Free Mass (lbs)  126.0  114.0  125.5  Total Body Water (lbs)  92.0  83.5  92.0    Preferred Learning Style:   No preference indicated   Learning Readiness:   Ready  24-hr recall: B (AM): Premier Protein (30 g) Snk (AM): Isopure throughout the day (40 g) L (PM): 2 oz fish or hamburger patty with zucchini and squash (14 g) Snk (PM): more isopure D (PM):  2 oz fish or hamburger patty with zucchini and squash (14 g) Snk (PM): SF popsicle or pickles  Fluid intake: 31 oz protein shake and 16 oz tea with sugar, sometimes water = 47 oz Estimated total protein intake: 98 g   Medications: see list Supplementation: taking  Using straws: sometimes Drinking while eating: No Hair loss: No Carbonated beverages: has had about 3 Cokes N/V/D/C: had some nausea/vomiting a few time after eating pickles and sweet tea, has some constipation and taking Probiotics for it and took Miralax once Dumping syndrome: No  Recent physical activity:  Walks on treadmill for about 60 minutes every other day  Progress Towards Goal(s):  In progress.  Handouts given during visit include:  Phase 3B High Protein + Non Starchy Vegetables   Nutritional Diagnosis:  [place]-3.3 Overweight/obesity  related to past poor dietary habits and physical inactivity as evidenced by patient w/ recent sleeve gastrectomy surgery following dietary guidelines for continued weight loss.    Intervention:  Nutrition education/diet advancment. Goals:  Follow Phase 3B: High Protein + Non-Starchy Vegetables  Eat 3-6 small meals/snacks, every 3-5 hrs  Increase lean protein foods to meet 60g goal  Increase fluid intake to 64oz +  Try having water with Mio or Crystal Light  Avoid sugar sweetened drinks and starches (potatoes)  Avoid carbonated beverages and straws  Avoid drinking 15 minutes before, during and 30 minutes after eating  Aim for >30 min of physical activity daily  Teaching Method Utilized:  Visual Auditory Hands on  Barriers to learning/adherence to lifestyle change: able to tolerate carbs  Demonstrated degree of understanding via:  Teach Back   Monitoring/Evaluation:  Dietary intake, exercise, lap band fills, and body weight. Follow up in 1 months for 3 month post-op visit.

## 2014-08-05 NOTE — Patient Instructions (Signed)
Goals:  Follow Phase 3B: High Protein + Non-Starchy Vegetables  Eat 3-6 small meals/snacks, every 3-5 hrs  Increase lean protein foods to meet 60g goal  Increase fluid intake to 64oz +  Try having water with Mio or Crystal Light  Avoid sugar sweetened drinks and starches (potatoes)  Avoid carbonated beverages and straws  Avoid drinking 15 minutes before, during and 30 minutes after eating  Aim for >30 min of physical activity daily

## 2014-09-03 ENCOUNTER — Ambulatory Visit (INDEPENDENT_AMBULATORY_CARE_PROVIDER_SITE_OTHER): Payer: 59 | Admitting: Pulmonary Disease

## 2014-09-03 ENCOUNTER — Encounter: Payer: Self-pay | Admitting: Pulmonary Disease

## 2014-09-03 VITALS — BP 102/86 | HR 74 | Temp 98.0°F | Ht 66.0 in | Wt 263.8 lb

## 2014-09-03 DIAGNOSIS — J4530 Mild persistent asthma, uncomplicated: Secondary | ICD-10-CM

## 2014-09-03 DIAGNOSIS — G4733 Obstructive sleep apnea (adult) (pediatric): Secondary | ICD-10-CM

## 2014-09-03 DIAGNOSIS — D869 Sarcoidosis, unspecified: Secondary | ICD-10-CM

## 2014-09-03 NOTE — Progress Notes (Signed)
Primary care: Silvestre Momentarol [facility]: Stacey DrainWilliam Truslow  Chief Complaint  Patient presents with  . Follow-up    Pt states that she stopped using CPAP in August--pt had Bariatric Surgery. Feels OSA needs to be re-evaluated. Lost 43lb.    History of Present Illness: [name] is a 48 y.o. female former smoker pulmonary/skin sarcoidosis, asthma, and OSA.  Since her last visit she had bariatric surgery.  She has lost 45 lbs.  Her sleep and breathing have improved with weight loss.  She is no longer using CPAP, and only uses her inhalers intermittently.  She is not having cough, wheeze, sputum, or chest pain.  She is sleeping well, and denies daytime sleepiness.  Tests: PSG 06/15/05>>AHI 28, CPAP 11 cm H2O PFT 06/19/09>>FEV1 1.78(62%), FVC 2.51(68%), FEV1% 71, TLC 4.03(74%), DLCO 65%, no BD PFT 08/10/12>>FEV1 2.00 (72%), FEV1% 79, TLC 4.03 (75%), DLCO 70%, + BD from FEF 25-75%. PSG 10/30/13 >> AHI 10, REM AHI 58.5, SpO2 low 76%. CPAP 12/30/13 >> CPAP 10 cm H2O >> AHI 1.1.  PMHx >> Anxiety, depression, HTN, GERD  PSHx, Medications, Allergies, Fhx, Shx reviewed.  Physical Exam:  General - No distress ENT - No sinus tenderness, no oral exudate, no LAN, upper dentures Cardiac - s1s2 regular, no murmur Chest - no wheeze Back - No focal tenderness Abd - Soft, non-tender Ext - ankle edema Neuro - Normal strength Skin - minimal scarring around nares Psych - Normal mood, and behavior   Assessment/Plan:  Obstructive sleep apnea. Plan: - monitor off CPAP as she continues to lose weight  Mild, persistent asthma >> improved. Plan: - she can use albuterol prn, and resume flovent if needed  Sarcoidosis with pulmonary and skin involvement. Plan: - she is to continue MTX and plaquenil per rheumatology  Coralyn HellingVineet Jayline Kilburg, MD [facility] Pager:  [phone]

## 2014-09-03 NOTE — Patient Instructions (Signed)
Follow up in 1 year.

## 2014-09-16 ENCOUNTER — Encounter: Payer: 59 | Attending: Surgery | Admitting: Dietician

## 2014-09-16 DIAGNOSIS — Z713 Dietary counseling and surveillance: Secondary | ICD-10-CM | POA: Diagnosis not present

## 2014-09-16 DIAGNOSIS — Z6841 Body Mass Index (BMI) 40.0 and over, adult: Secondary | ICD-10-CM | POA: Insufficient documentation

## 2014-09-16 NOTE — Patient Instructions (Signed)
Goals:  Follow Phase 3B: High Protein + Non-Starchy Vegetables  Eat 3-6 small meals/snacks, every 3-5 hrs  Increase lean protein foods to meet 60g goal  Increase fluid intake to 64oz +  Have Crystal Light made up in the fridge for the weekends (like tea).   Plan out meals (protein and fluid) for weekends ahead of time.   Don't wait until you are hungry to decide about what food to eat  Try to eat 2 meals per day with protein and non starchy vegetables  Avoid using straws  Avoid drinking 15 minutes before, during and 30 minutes after eating  Aim for >30 min of physical activity daily

## 2014-09-16 NOTE — Progress Notes (Signed)
  Follow-up visit:  12 Weeks Post-Operative Sleeve Gastrectomy Surgery  Medical Nutrition Therapy:  Appt start time: 740 end time:  800.  Primary concerns today: Post-operative Bariatric Surgery Nutrition Management. Overall feeling well. Tolerating the foods she is eating. Lost 11 lbs of fat mass since previous visit. /Frustrated that the scale hasn't moved more. "Cheated" over Thanksgiving. Doing better with protein during the week and struggling on the weekend. Can tolerate all foods including cakes and ice cream.   Surgery date: 06/04/2014  Surgery type: Sleeve  [facility] 308.5 lbs on 05/13/2014  Weight today: 259.0  Lbs  Weight change: 6.5 lbs, 11 lbs of fat mass lost Total weight loss: 43 lbs   TANITA BODY COMP RESULTS   05/13/14  06/18/14  08/05/14 09/16/14  BMI (kg/m^2)  49.8  44.9  42.9 41.8  Fat Mass (lbs)  182.5  164.0  140.0 129.0  Fat Free Mass (lbs)  126.0  114.0  125.5 130.0  Total Body Water (lbs)  92.0  83.5  92.0 95.0    Preferred Learning Style:   No preference indicated   Learning Readiness:   Ready  24-hr recall: B (AM): Premier Protein (30 g) Snk (AM): Isopure throughout the day (20 g) L (PM): 2 oz fish or hamburger patty with zucchini and squash (14 g) Snk (PM): more isopure D (PM):  2 oz fish or hamburger patty with zucchini and squash (14 g) Snk (PM): SF popsicle or pickles  Fluid intake: 31 oz protein shake and 16 oz tea with sugar on weekends, 32 oz water with crystal light = close to 64 oz Estimated total protein intake: 54-78 g   Medications: see list Supplementation: taking  Using straws: Yes Drinking while eating: No Hair loss: a little Carbonated beverages: No N/V/D/C: No Dumping syndrome: No  Recent physical activity:  Walks on treadmill for about 60 minutes every other day  Progress Towards Goal(s):  In progress.    Nutritional Diagnosis:  [place]-3.3 Overweight/obesity related to past poor dietary habits and physical  inactivity as evidenced by patient w/ recent sleeve gastrectomy surgery following dietary guidelines for continued weight loss.    Intervention:  Nutrition education/diet reinforcment. Goals:  Follow Phase 3B: High Protein + Non-Starchy Vegetables  Eat 3-6 small meals/snacks, every 3-5 hrs  Increase lean protein foods to meet 60g goal  Increase fluid intake to 64oz +  Have Crystal Light made up in the fridge for the weekends (like tea).   Plan out meals (protein and fluid) for weekends ahead of time.   Don't wait until you are hungry to decide about what food to eat  Try to eat 2 meals per day with protein and non starchy vegetables  Avoid using straws  Avoid drinking 15 minutes before, during and 30 minutes after eating  Aim for >30 min of physical activity daily  Teaching Method Utilized:  Visual Auditory Hands on  Barriers to learning/adherence to lifestyle change: able to tolerate carbs  Demonstrated degree of understanding via:  Teach Back   Monitoring/Evaluation:  Dietary intake, exercise, and body weight. Follow up in 1 months for 4 month post-op visit.

## 2014-09-25 LAB — COMPREHENSIVE METABOLIC PANEL
ALT: 16 U/L (ref 0–35)
AST: 24 U/L (ref 0–37)
Albumin: 3.5 g/dL (ref 3.5–5.2)
Alkaline Phosphatase: 90 U/L (ref 39–117)
BUN: 16 mg/dL (ref 6–23)
CO2: 29 mEq/L (ref 19–32)
Calcium: 9.4 mg/dL (ref 8.4–10.5)
Chloride: 102 mEq/L (ref 96–112)
Creat: 0.64 mg/dL (ref 0.50–1.10)
Glucose, Bld: 71 mg/dL (ref 70–99)
Potassium: 3.8 mEq/L (ref 3.5–5.3)
Sodium: 140 mEq/L (ref 135–145)
Total Bilirubin: 0.4 mg/dL (ref 0.2–1.2)
Total Protein: 6.4 g/dL (ref 6.0–8.3)

## 2014-09-25 LAB — CBC WITH DIFFERENTIAL/PLATELET
Basophils Absolute: 0.1 10*3/uL (ref 0.0–0.1)
Basophils Relative: 1 % (ref 0–1)
Eosinophils Absolute: 0.1 10*3/uL (ref 0.0–0.7)
Eosinophils Relative: 2 % (ref 0–5)
HCT: 31.4 % — ABNORMAL LOW (ref 36.0–46.0)
Hemoglobin: 10.3 g/dL — ABNORMAL LOW (ref 12.0–15.0)
Lymphocytes Relative: 19 % (ref 12–46)
Lymphs Abs: 1.1 10*3/uL (ref 0.7–4.0)
MCH: 25.9 pg — ABNORMAL LOW (ref 26.0–34.0)
MCHC: 32.8 g/dL (ref 30.0–36.0)
MCV: 79.1 fL (ref 78.0–100.0)
Monocytes Absolute: 0.4 10*3/uL (ref 0.1–1.0)
Monocytes Relative: 7 % (ref 3–12)
Neutro Abs: 4.1 10*3/uL (ref 1.7–7.7)
Neutrophils Relative %: 71 % (ref 43–77)
Platelets: 296 10*3/uL (ref 150–400)
RBC: 3.97 MIL/uL (ref 3.87–5.11)
RDW: 17.6 % — ABNORMAL HIGH (ref 11.5–15.5)
WBC: 5.8 10*3/uL (ref 4.0–10.5)

## 2014-09-25 LAB — MAGNESIUM: Magnesium: 1.8 mg/dL (ref 1.5–2.5)

## 2014-09-25 LAB — VITAMIN B12: Vitamin B-12: 1740 pg/mL — ABNORMAL HIGH (ref 211–911)

## 2014-09-28 LAB — VITAMIN B1: Vitamin B1 (Thiamine): 10 nmol/L (ref 8–30)

## 2014-10-15 ENCOUNTER — Encounter: Payer: 59 | Attending: Surgery | Admitting: Dietician

## 2014-10-15 DIAGNOSIS — Z6841 Body Mass Index (BMI) 40.0 and over, adult: Secondary | ICD-10-CM | POA: Diagnosis not present

## 2014-10-15 DIAGNOSIS — Z713 Dietary counseling and surveillance: Secondary | ICD-10-CM | POA: Diagnosis not present

## 2014-10-15 NOTE — Progress Notes (Signed)
  Follow-up visit:  4 Months Post-Operative Sleeve Gastrectomy Surgery  Medical Nutrition Therapy:  Appt start time: 800 end time:  830.  Primary concerns today: Post-operative Bariatric Surgery Nutrition Management. Return with a 19 lbs weight loss. Has cut out carbs for the most part. No longer having sweets regularly. Stopped walking in the past month but joined the gym and plans to start working out regularly.   Surgery date: 06/04/2014  Surgery type: Sleeve  [facility]: [facility].5 lbs on 05/13/2014  Weight today: 240.0 Lbs  Weight change: 19 lbs Total weight loss: 62 lbs   TANITA BODY COMP RESULTS   05/13/14  06/18/14  08/05/14 09/16/14 10/15/13  BMI (kg/m^2)  49.8  44.9  42.9 41.8 38.7  Fat Mass (lbs)  182.5  164.0  140.0 129.0 127.0  Fat Free Mass (lbs)  126.0  114.0  125.5 130.0 113.0  Total Body Water (lbs)  92.0  83.5  92.0 95.0 82.5    Preferred Learning Style:   No preference indicated   Learning Readiness:   Ready  24-hr recall: B (AM): Premier Protein (30 g) Snk (AM): Isopure throughout the day (20 g) L (PM): 2 oz fish or hamburger patty with zucchini and squash (14 g) Snk (PM): more isopure D (PM):  2 oz fish or hamburger patty with zucchini and squash (14 g) Snk (PM): SF popsicle or pickles  Fluid intake: 31 oz protein shake and 16 oz tea with sugar on weekends, 32 oz water with crystal light = close to 64 oz Estimated total protein intake: 54-78 g   Medications: see list Supplementation: taking  Using straws: Yes Drinking while eating: No Hair loss: a little Carbonated beverages: No N/V/D/C: No Dumping syndrome: No  Recent physical activity:  Joined the gym though hasn't been walking as much as before  Progress Towards Goal(s):  In progress.    Nutritional Diagnosis:  [place]-3.3 Overweight/obesity related to past poor dietary habits and physical inactivity as evidenced by patient w/ recent sleeve gastrectomy surgery following dietary guidelines for  continued weight loss.    Intervention:  Nutrition education/diet reinforcment. Goals:  Follow Phase 3B: High Protein + Non-Starchy Vegetables  Eat 3-6 small meals/snacks, every 3-5 hrs  Increase lean protein foods to meet 60g goal  Increase fluid intake to 64oz +  Plan out meals on weekends  Try to eat 2 meals per day with protein and non starchy vegetables  Avoid drinking 15 minutes before, during and 30 minutes after eating  Talk with trainer at [employer] about fitness plan (exercise most days of the week)  If you want to add popcorn a few times per week, make it yourself and have protein with it   Avoid drinking 15 minutes before, during and 30 minutes after eating  Aim for >30 min of physical activity daily  Teaching Method Utilized:  Visual Auditory Hands on  Barriers to learning/adherence to lifestyle change: able to tolerate carbs  Demonstrated degree of understanding via:  Teach Back   Monitoring/Evaluation:  Dietary intake, exercise, and body weight. Follow up in 3 months for 7 month post-op visit.

## 2014-10-15 NOTE — Patient Instructions (Addendum)
Goals:  Follow Phase 3B: High Protein + Non-Starchy Vegetables  Eat 3-6 small meals/snacks, every 3-5 hrs  Increase lean protein foods to meet 60g goal  Increase fluid intake to 64oz +  Plan out meals on weekends  Try to eat 2 meals per day with protein and non starchy vegetables  Avoid drinking 15 minutes before, during and 30 minutes after eating  Talk with trainer at [employer] about fitness plan (exercise most days of the week)  If you want to add popcorn a few times per week, make it yourself and have protein with it

## 2015-01-15 ENCOUNTER — Encounter: Payer: 59 | Attending: Surgery | Admitting: Dietician

## 2015-01-15 DIAGNOSIS — Z713 Dietary counseling and surveillance: Secondary | ICD-10-CM | POA: Insufficient documentation

## 2015-01-15 DIAGNOSIS — Z6841 Body Mass Index (BMI) 40.0 and over, adult: Secondary | ICD-10-CM | POA: Insufficient documentation

## 2015-01-15 NOTE — Patient Instructions (Addendum)
Goals:  Follow Phase 3B: High Protein + Non-Starchy Vegetables  Eat 3-6 small meals/snacks, every 3-5 hrs  Increase lean protein foods to meet 60g goal  Increase fluid intake to 64oz +  Avoid drinking 15 minutes before, during and 30 minutes after eating  Try cutting sugar down in coffee and replace with splenda or stevia (use less because it is sweet and too much tastes bitter)  Add in some grits at dinner or lunch (after you eat protein)

## 2015-01-15 NOTE — Progress Notes (Signed)
  Follow-up visit:  7.5 Months Post-Operative Sleeve Gastrectomy Surgery  Medical Nutrition Therapy:  Appt start time: 740 end time:  800.  Primary concerns today: Post-operative Bariatric Surgery Nutrition Management. Return with a 24 lbs weight loss. Feeling good overall. Working out with a Psychologist, educationaltrainer 3-4 x week. Able to jog 1/2 mile! Tolerating all foods. Cooking more meals at home. Feeling good overall.   Surgery date: 06/04/2014  Surgery type: Sleeve  [facility].5 lbs on 05/13/2014  Weight today: 216.0 Lbs  Weight change: 24 lbs Total weight loss: 86 lbs Goal weight: 200 lbs   TANITA BODY COMP RESULTS   05/13/14  06/18/14  08/05/14 09/16/14 10/15/13 01/14/14  BMI (kg/m^2)  49.8  44.9  42.9 41.8 38.7 34.9  Fat Mass (lbs)  182.5  164.0  140.0 129.0 127.0 106.0  Fat Free Mass (lbs)  126.0  114.0  125.5 130.0 113.0 110.0  Total Body Water (lbs)  92.0  83.5  92.0 95.0 82.5 80.5    Preferred Learning Style:   No preference indicated   Learning Readiness:   Ready  24-hr recall: B (AM): Premier Protein (30 g) Snk (AM): Isopure throughout the day (10-20 g) L (PM): 3 oz fish or hamburger patty or chicken with zucchini and squash (21 g) Snk (PM): more isopure D (PM):  3 oz fish or hamburger patty with zucchini and squash (21 g) Snk (PM): SF popsicle or pickles  Fluid intake: 21-31 oz protein shake 32 oz water with crystal light, 20 oz coffee with sugar (2 Tablespoons) and creamer = close to 64 oz Estimated total protein intake: 82-92 g   Medications: see list Supplementation: taking  Using straws: No Drinking while eating: No Hair loss: No Carbonated beverages: No N/V/D/C: No Dumping syndrome: No  Recent physical activity:  Works out with with a trainer 3-4 x week for at least an hour  Progress Towards Goal(s):  In progress.    Nutritional Diagnosis:  [place]-3.3 Overweight/obesity related to past poor dietary habits and physical inactivity as evidenced by patient w/  recent sleeve gastrectomy surgery following dietary guidelines for continued weight loss.    Intervention:  Nutrition education/diet reinforcment. Goals:  Follow Phase 3B: High Protein + Non-Starchy Vegetables  Eat 3-6 small meals/snacks, every 3-5 hrs  Increase lean protein foods to meet 60g goal  Increase fluid intake to 64oz +  Avoid drinking 15 minutes before, during and 30 minutes after eating  Try cutting sugar down in coffee and replace with splenda or stevia (use less because it is sweet and too much tastes bitter)  Add in some grits at dinner or lunch (after you eat protein)  Teaching Method Utilized:  Visual Auditory Hands on  Barriers to learning/adherence to lifestyle change: none  Demonstrated degree of understanding via:  Teach Back   Monitoring/Evaluation:  Dietary intake, exercise, and body weight. Follow up in 3 months for 10.5 month post-op visit.

## 2015-04-16 ENCOUNTER — Encounter: Payer: 59 | Attending: Surgery | Admitting: Dietician

## 2015-04-16 ENCOUNTER — Encounter: Payer: Self-pay | Admitting: Dietician

## 2015-04-16 DIAGNOSIS — Z6841 Body Mass Index (BMI) 40.0 and over, adult: Secondary | ICD-10-CM | POA: Diagnosis not present

## 2015-04-16 DIAGNOSIS — Z713 Dietary counseling and surveillance: Secondary | ICD-10-CM | POA: Diagnosis not present

## 2015-04-16 NOTE — Progress Notes (Signed)
  Follow-up visit: 10.5 Months Post-Operative Sleeve Gastrectomy Surgery  Medical Nutrition Therapy:  Appt start time: 725 end time:  745.  Primary concerns today: Post-operative Bariatric Surgery Nutrition Management. Return with a 12.5 lbs weight loss. Feeling good overall. No longer working out with a Psychologist, educationaltrainer. Trainer was no longer able to work out and having trouble exericising on her own. Still cooking more meals at home.   Surgery date: 06/04/2014  Surgery type: Sleeve  [facility] on 05[facility]13/2014  Weight today: 203.5 lbs  Weight change: 12.5 lbs Total weight loss: 98.5 lbs Goal weight: 200 lbs   TANITA BODY COMP RESULTS   05/13/14  06/18/14  08/05/14 09/16/14 10/15/13 01/14/14 04/16/15  BMI (kg/m^2)  49.8  44.9  42.9 41.8 38.7 34.9 32.8  Fat Mass (lbs)  182.5  164.0  140.0 129.0 127.0 106.0 97.5  Fat Free Mass (lbs)  126.0  114.0  125.5 130.0 113.0 110.0 106.0  Total Body Water (lbs)  92.0  83.5  92.0 95.0 82.5 80.5 77.5    Preferred Learning Style:   No preference indicated   Learning Readiness:   Ready  24-hr recall: B (AM): Premier Protein (30 g) Snk (AM): Isopure throughout the day (10-20 g) L (PM): 3 oz fish or hamburger patty or chicken with zucchini and squash with grits sometimes (21 g) Snk (PM): more isopure D (PM):  3 oz fish or hamburger patty with zucchini and squash (21 g) Snk (PM): SF popsicle or pickles  Fluid intake: 21-31 oz protein shake 32 oz water with crystal light, 20 oz coffee with 1 tsp of sugar and sweet and low and creamer = close to 64 oz Estimated total protein intake: 82-92 g   Medications: see list Supplementation: taking B12, not taking MVI or calcium since she "hates them"  Using straws: every once in a while Drinking while eating: No Hair loss: No Carbonated beverages: every once in a while N/V/D/C: No Dumping syndrome: No  Recent physical activity:  None right now but planning to return  Progress Towards Goal(s):  In  progress.    Nutritional Diagnosis:  [place]-3.3 Overweight/obesity related to past poor dietary habits and physical inactivity as evidenced by patient w/ recent sleeve gastrectomy surgery following dietary guidelines for continued weight loss.    Intervention:  Nutrition education/diet reinforcment. Goals:  Follow Phase 3B: High Protein + Non-Starchy Vegetables  Eat 3-6 small meals/snacks, every 3-5 hrs  Increase lean protein foods to meet 60g goal  Increase fluid intake to 64oz +  Avoid drinking 15 minutes before, during and 30 minutes after eating  Walk on the treadmill at least 3 x week after work  Try citrical petites (calcium) and try to swallow a complete adult multivitamin  Add some fruit to meals/snacks (eat it with protein)  Teaching Method Utilized:  Visual Auditory Hands on  Barriers to learning/adherence to lifestyle change: none  Demonstrated degree of understanding via:  Teach Back   Monitoring/Evaluation:  Dietary intake, exercise, and body weight. Follow up in 3 months for 13.5 month post-op visit.

## 2015-04-16 NOTE — Patient Instructions (Addendum)
Goals:  Follow Phase 3B: High Protein + Non-Starchy Vegetables  Eat 3-6 small meals/snacks, every 3-5 hrs  Increase lean protein foods to meet 60g goal  Increase fluid intake to 64oz +  Avoid drinking 15 minutes before, during and 30 minutes after eating  Walk on the treadmill at least 3 x week after work  Try citrical petites (calcium) and try to swallow a complete adult multivitamin  Add some fruit to meals/snacks (eat it with protein)   Surgery date: 06/04/2014  Surgery type: Sleeve  [facility] on 05/13/2014  Weight today: 203.5 lbs  Weight change: 12.5 lbs Total weight loss: 98.5 lbs Goal weight: 200 lbs   TANITA BODY COMP RESULTS   05/13/14  06/18/14  08/05/14 09/16/14 10/15/13 01/14/14 04/16/15  BMI (kg/m^2)  49.8  44.9  42.9 41.8 38.7 34.9 32.8  Fat Mass (lbs)  182.5  164.0  140.0 129.0 127.0 106.0 97.5  Fat Free Mass (lbs)  126.0  114.0  125.5 130.0 113.0 110.0 106.0  Total Body Water (lbs)  92.0  83.5  92.0 95.0 82.5 80.5 77.5

## 2015-07-14 ENCOUNTER — Encounter: Payer: Self-pay | Admitting: Dietician

## 2015-07-14 ENCOUNTER — Encounter: Payer: 59 | Attending: Surgery | Admitting: Dietician

## 2015-07-14 DIAGNOSIS — Z713 Dietary counseling and surveillance: Secondary | ICD-10-CM | POA: Insufficient documentation

## 2015-07-14 DIAGNOSIS — Z6841 Body Mass Index (BMI) 40.0 and over, adult: Secondary | ICD-10-CM | POA: Diagnosis not present

## 2015-07-14 NOTE — Patient Instructions (Addendum)
Goals:  Follow Phase 3B: High Protein + Non-Starchy Vegetables  Eat 3-6 small meals/snacks, every 3-5 hrs  Increase lean protein foods to meet 60g goal  Increase fluid intake to 64oz +  Avoid drinking 15 minutes before, during and 30 minutes after eating  Start taking vitamins again  Stop drinking mountain dew and sweet tea  Get back to exercising this week  Get back to planning meals

## 2015-07-14 NOTE — Progress Notes (Signed)
  Follow-up visit: 13 Months Post-Operative Sleeve Gastrectomy Surgery  Medical Nutrition Therapy:  Appt start time: 735 end time: 750 .  Primary concerns today: Post-operative Bariatric Surgery Nutrition Management. Return with a 5.9 lbs weight gain. Has been on vacation this past week and will be off this week and has been eating "everything". Did gain some weight before that. Feels like she knows what to do and just not doing it. Is exercising regularly though hasn't been exercising in the past week.   Would like to get back on track this week. New goal is 180 lbs in 6 months.  Surgery date: 06/04/2014  Surgery type: Sleeve  Start weight at [facility]: 308.5 lbs on 05/13/2014  Weight today: 209.4 lbs  Weight change: 5.9 lbs Total weight loss: 92.6lbs Goal weight: 180 lbs   TANITA BODY COMP RESULTS   05/13/14  06/18/14  08/05/14 09/16/14 10/15/13 01/14/14 04/16/15 07/14/15  BMI (kg/m^2)  49.8  44.9  42.9 41.8 38.7 34.9 32.8 N/A  Fat Mass (lbs)  182.5  164.0  140.0 129.0 127.0 106.0 97.5   Fat Free Mass (lbs)  126.0  114.0  125.5 130.0 113.0 110.0 106.0   Total Body Water (lbs)  92.0  83.5  92.0 95.0 82.5 80.5 77.5     Preferred Learning Style:   No preference indicated   Learning Readiness:   Ready  24-hr recall: B (AM): Premier Protein (30 g) Snk (AM): none usually  L (PM): 3 oz fish or hamburger patty or chicken with zucchini and squash with grits sometimes (21 g) Snk (PM): none D (PM):  3 oz fish or hamburger patty with zucchini and squash (21 g) Snk (PM): none or Premier Protein (30 g)  Fluid intake: 11-22 oz protein shake 32 oz water with crystal light, 20 oz coffee with 1 tsp of sugar and sweet and low and creamer, 64 oz of sweet tea, Mountain Dew Estimated total protein intake: 82-92 g   Medications: see list Supplementation: taking B12, not taking MVI or calcium since she "hates them"  Using straws: Yes Drinking while eating: Sometimes Hair loss: No Carbonated beverages:  Mountain Dew sometimes  N/V/D/C: No Dumping syndrome: No  Recent physical activity:  Jogging/weights/floor exercises 3 x week for for 60-90 minutes  Progress Towards Goal(s):  In progress.    Nutritional Diagnosis:  [place]-3.3 Overweight/obesity related to past poor dietary habits and physical inactivity as evidenced by patient w/ recent sleeve gastrectomy surgery following dietary guidelines for continued weight loss.    Intervention:  Nutrition education/diet reinforcment. Goals:  Follow Phase 3B: High Protein + Non-Starchy Vegetables  Eat 3-6 small meals/snacks, every 3-5 hrs  Increase lean protein foods to meet 60g goal  Increase fluid intake to 64oz +  Avoid drinking 15 minutes before, during and 30 minutes after eating  Start taking vitamins again  Stop drinking mountain dew and sweet tea  Get back to exercising this week  Get back to planning meals  Teaching Method Utilized:  Visual Auditory Hands on  Barriers to learning/adherence to lifestyle change: none  Demonstrated degree of understanding via:  Teach Back   Monitoring/Evaluation:  Dietary intake, exercise, and body weight. Follow up in 6 months for 21 month post-op visit.

## 2015-11-05 ENCOUNTER — Ambulatory Visit (INDEPENDENT_AMBULATORY_CARE_PROVIDER_SITE_OTHER): Payer: 59 | Admitting: Physician Assistant

## 2015-11-05 ENCOUNTER — Ambulatory Visit (INDEPENDENT_AMBULATORY_CARE_PROVIDER_SITE_OTHER): Payer: 59

## 2015-11-05 VITALS — BP 120/80 | HR 78 | Temp 98.0°F | Resp 18 | Ht 66.0 in | Wt 218.5 lb

## 2015-11-05 DIAGNOSIS — Z23 Encounter for immunization: Secondary | ICD-10-CM

## 2015-11-05 DIAGNOSIS — M79644 Pain in right finger(s): Secondary | ICD-10-CM

## 2015-11-05 DIAGNOSIS — S61216A Laceration without foreign body of right little finger without damage to nail, initial encounter: Secondary | ICD-10-CM

## 2015-11-05 MED ORDER — CEPHALEXIN 500 MG PO CAPS: 500.0000 mg | ORAL_CAPSULE | Freq: Two times a day (BID) | ORAL | Status: DC

## 2015-11-05 NOTE — Patient Instructions (Addendum)
Because you received an x-ray today, you will receive an invoice from [facility]. Please contact [facility] at [phone] with questions or concerns regarding your invoice. Our billing staff will not be able to assist you with those questions.  Continue to keep splint on even at night.  You can take tylenol for the pain.  Please await contact from orthopedic referalFinger Fracture Fractures of fingers are breaks in the bones of the fingers. There are many types of fractures. There are different ways of treating these fractures. Your health care provider will discuss the best way to treat your fracture. CAUSES Traumatic injury is the main cause of broken fingers. These include:  Injuries while playing sports.  Workplace injuries.  Falls. RISK FACTORS Activities that can increase your risk of finger fractures include:  Sports.  Workplace activities that involve machinery.  A condition called osteoporosis, which can make your bones less dense and cause them to fracture more easily. SIGNS AND SYMPTOMS The main symptoms of a broken finger are pain and swelling within 15 minutes after the injury. Other symptoms include:  Bruising of your finger.  Stiffness of your finger.  Numbness of your finger.  Exposed bones (compound fracture) if the fracture is severe. DIAGNOSIS  The best way to diagnose a broken bone is with X-ray imaging. Additionally, your health care provider will use this X-ray image to evaluate the position of the broken finger bones.  TREATMENT  Finger fractures can be treated with:   Nonreduction--This means the bones are in place. The finger is splinted without changing the positions of the bone pieces. The splint is usually left on for about a week to 10 days. This will depend on your fracture and what your health care provider thinks.  Closed reduction--The bones are put back into position without using surgery. The finger is then  splinted.  Open reduction and internal fixation--The fracture site is opened. Then the bone pieces are fixed into place with pins or some type of hardware. This is seldom required. It depends on the severity of the fracture. HOME CARE INSTRUCTIONS   Follow your health care provider's instructions regarding activities, exercises, and physical therapy.  Only take over-the-counter or prescription medicines for pain, discomfort, or fever as directed by your health care provider. SEEK MEDICAL CARE IF: You have pain or swelling that limits the motion or use of your fingers. SEEK IMMEDIATE MEDICAL CARE IF:  Your finger becomes numb. MAKE SURE YOU:   Understand these instructions.  Will watch your condition.  Will get help right away if you are not doing well or get worse.   This information is not intended to replace advice given to you by your health care provider. Make sure you discuss any questions you have with your health care provider.   Document Released: 01/09/2001 Document Revised: 07/18/2013 Document Reviewed: 05/09/2013 Else[employer] Interactive Patient Education [employer]
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
## 2015-11-07 ENCOUNTER — Other Ambulatory Visit: Payer: Self-pay | Admitting: Orthopedic Surgery

## 2015-11-07 ENCOUNTER — Encounter (HOSPITAL_BASED_OUTPATIENT_CLINIC_OR_DEPARTMENT_OTHER): Payer: Self-pay | Admitting: *Deleted

## 2015-11-08 ENCOUNTER — Encounter: Payer: Self-pay | Admitting: Physician Assistant

## 2015-11-11 ENCOUNTER — Ambulatory Visit (HOSPITAL_BASED_OUTPATIENT_CLINIC_OR_DEPARTMENT_OTHER): Payer: 59 | Admitting: Anesthesiology

## 2015-11-11 ENCOUNTER — Ambulatory Visit (HOSPITAL_BASED_OUTPATIENT_CLINIC_OR_DEPARTMENT_OTHER)
Admission: RE | Admit: 2015-11-11 | Discharge: 2015-11-11 | Disposition: A | Discharge status: Home / Self Care | Payer: 59 | Source: Ambulatory Visit | Attending: Orthopedic Surgery | Admitting: Orthopedic Surgery

## 2015-11-11 ENCOUNTER — Encounter (HOSPITAL_BASED_OUTPATIENT_CLINIC_OR_DEPARTMENT_OTHER)
Admission: RE | Disposition: A | Discharge status: Home / Self Care | Payer: Self-pay | Source: Ambulatory Visit | Attending: Orthopedic Surgery

## 2015-11-11 ENCOUNTER — Encounter (HOSPITAL_BASED_OUTPATIENT_CLINIC_OR_DEPARTMENT_OTHER): Payer: Self-pay | Admitting: Orthopedic Surgery

## 2015-11-11 DIAGNOSIS — D863 Sarcoidosis of skin: Secondary | ICD-10-CM | POA: Insufficient documentation

## 2015-11-11 DIAGNOSIS — I10 Essential (primary) hypertension: Secondary | ICD-10-CM | POA: Diagnosis not present

## 2015-11-11 DIAGNOSIS — Z79899 Other long term (current) drug therapy: Secondary | ICD-10-CM | POA: Diagnosis not present

## 2015-11-11 DIAGNOSIS — G4733 Obstructive sleep apnea (adult) (pediatric): Secondary | ICD-10-CM | POA: Diagnosis not present

## 2015-11-11 DIAGNOSIS — W1830XA Fall on same level, unspecified, initial encounter: Secondary | ICD-10-CM | POA: Insufficient documentation

## 2015-11-11 DIAGNOSIS — K219 Gastro-esophageal reflux disease without esophagitis: Secondary | ICD-10-CM | POA: Insufficient documentation

## 2015-11-11 DIAGNOSIS — J45909 Unspecified asthma, uncomplicated: Secondary | ICD-10-CM | POA: Diagnosis not present

## 2015-11-11 DIAGNOSIS — M1990 Unspecified osteoarthritis, unspecified site: Secondary | ICD-10-CM | POA: Insufficient documentation

## 2015-11-11 DIAGNOSIS — Z87891 Personal history of nicotine dependence: Secondary | ICD-10-CM | POA: Insufficient documentation

## 2015-11-11 DIAGNOSIS — S62616A Displaced fracture of proximal phalanx of right little finger, initial encounter for closed fracture: Secondary | ICD-10-CM | POA: Insufficient documentation

## 2015-11-11 DIAGNOSIS — F329 Major depressive disorder, single episode, unspecified: Secondary | ICD-10-CM | POA: Diagnosis not present

## 2015-11-11 HISTORY — PX: PERCUTANEOUS PINNING: SHX2209

## 2015-11-11 SURGERY — PINNING, EXTREMITY, PERCUTANEOUS
Anesthesia: General | Site: Finger | Laterality: Right

## 2015-11-11 MED ORDER — DEXAMETHASONE SODIUM PHOSPHATE 10 MG/ML IJ SOLN
INTRAMUSCULAR | Status: DC | PRN
  Administered 2015-11-11: 10 mg via INTRAVENOUS

## 2015-11-11 MED ORDER — LACTATED RINGERS IV SOLN
INTRAVENOUS | Status: DC
  Administered 2015-11-11: 13:00:00 via INTRAVENOUS

## 2015-11-11 MED ORDER — HYDROCODONE-ACETAMINOPHEN 10-325 MG PO TABS: 1.0000 | ORAL_TABLET | Freq: Four times a day (QID) | ORAL | Status: DC | PRN

## 2015-11-11 MED ORDER — FENTANYL CITRATE (PF) 100 MCG/2ML IJ SOLN
INTRAMUSCULAR | Status: AC
  Filled 2015-11-11: qty 2

## 2015-11-11 MED ORDER — PROPOFOL 10 MG/ML IV BOLUS
INTRAVENOUS | Status: DC | PRN
  Administered 2015-11-11: 200 mg via INTRAVENOUS

## 2015-11-11 MED ORDER — LACTATED RINGERS IV SOLN: INTRAVENOUS | Status: DC

## 2015-11-11 MED ORDER — FENTANYL CITRATE (PF) 100 MCG/2ML IJ SOLN
50.0000 ug | INTRAMUSCULAR | Status: DC | PRN
  Administered 2015-11-11: 50 ug via INTRAVENOUS

## 2015-11-11 MED ORDER — PROMETHAZINE HCL 25 MG/ML IJ SOLN: 6.2500 mg | INTRAMUSCULAR | Status: DC | PRN

## 2015-11-11 MED ORDER — DEXAMETHASONE SODIUM PHOSPHATE 10 MG/ML IJ SOLN
INTRAMUSCULAR | Status: AC
  Filled 2015-11-11: qty 1

## 2015-11-11 MED ORDER — MIDAZOLAM HCL 2 MG/2ML IJ SOLN
1.0000 mg | INTRAMUSCULAR | Status: DC | PRN
  Administered 2015-11-11: 2 mg via INTRAVENOUS

## 2015-11-11 MED ORDER — PROPOFOL 10 MG/ML IV BOLUS
INTRAVENOUS | Status: AC
  Filled 2015-11-11: qty 20

## 2015-11-11 MED ORDER — CEFAZOLIN SODIUM-DEXTROSE 2-3 GM-% IV SOLR: 2.0000 g | INTRAVENOUS | Status: DC

## 2015-11-11 MED ORDER — HYDROMORPHONE HCL 1 MG/ML IJ SOLN: 0.2500 mg | INTRAMUSCULAR | Status: DC | PRN

## 2015-11-11 MED ORDER — MEPERIDINE HCL 25 MG/ML IJ SOLN: 6.2500 mg | INTRAMUSCULAR | Status: DC | PRN

## 2015-11-11 MED ORDER — ONDANSETRON HCL 4 MG/2ML IJ SOLN
INTRAMUSCULAR | Status: AC
  Filled 2015-11-11: qty 2

## 2015-11-11 MED ORDER — ONDANSETRON HCL 4 MG/2ML IJ SOLN
INTRAMUSCULAR | Status: DC | PRN
Start: 2015-11-11 — End: 2015-11-11
  Administered 2015-11-11: 4 mg via INTRAVENOUS

## 2015-11-11 MED ORDER — BUPIVACAINE HCL (PF) 0.25 % IJ SOLN
INTRAMUSCULAR | Status: DC | PRN
  Administered 2015-11-11: 6 mL

## 2015-11-11 MED ORDER — CHLORHEXIDINE GLUCONATE 4 % EX LIQD: 60.0000 mL | Freq: Once | CUTANEOUS | Status: DC

## 2015-11-11 MED ORDER — CEFAZOLIN SODIUM-DEXTROSE 2-3 GM-% IV SOLR
INTRAVENOUS | Status: AC
  Filled 2015-11-11: qty 50

## 2015-11-11 MED ORDER — MIDAZOLAM HCL 2 MG/2ML IJ SOLN
INTRAMUSCULAR | Status: AC
  Filled 2015-11-11: qty 2

## 2015-11-11 MED ORDER — LIDOCAINE HCL (CARDIAC) 20 MG/ML IV SOLN
INTRAVENOUS | Status: DC | PRN
  Administered 2015-11-11: 60 mg via INTRAVENOUS

## 2015-11-11 MED ORDER — LIDOCAINE HCL (CARDIAC) 20 MG/ML IV SOLN
INTRAVENOUS | Status: AC
  Filled 2015-11-11: qty 5

## 2015-11-11 MED ORDER — CEFAZOLIN SODIUM-DEXTROSE 2-3 GM-% IV SOLR
2.0000 g | INTRAVENOUS | Status: AC
  Administered 2015-11-11: 2 g via INTRAVENOUS

## 2015-11-11 MED ORDER — GLYCOPYRROLATE 0.2 MG/ML IJ SOLN
0.2000 mg | Freq: Once | INTRAMUSCULAR | Status: DC | PRN
Start: 2015-11-11 — End: 2015-11-11

## 2015-11-11 MED ORDER — SCOPOLAMINE 1 MG/3DAYS TD PT72: 1.0000 | MEDICATED_PATCH | Freq: Once | TRANSDERMAL | Status: DC | PRN

## 2015-11-11 SURGICAL SUPPLY — 41 items
BANDAGE COBAN STERILE 2 (GAUZE/BANDAGES/DRESSINGS) ×2 IMPLANT
BLADE SURG 15 STRL LF DISP TIS (BLADE) ×1 IMPLANT
BLADE SURG 15 STRL SS (BLADE) ×1
BNDG COHESIVE 3X5 TAN STRL LF (GAUZE/BANDAGES/DRESSINGS) IMPLANT
BNDG ESMARK 4X9 LF (GAUZE/BANDAGES/DRESSINGS) IMPLANT
BNDG GAUZE ELAST 4 BULKY (GAUZE/BANDAGES/DRESSINGS) IMPLANT
BNDG PLASTER X FAST 3X3 WHT LF (CAST SUPPLIES) ×16 IMPLANT
CHLORAPREP W/TINT 26ML (MISCELLANEOUS) ×2 IMPLANT
CORDS BIPOLAR (ELECTRODE) IMPLANT
COVER BACK TABLE 60X90IN (DRAPES) ×2 IMPLANT
COVER MAYO STAND STRL (DRAPES) ×2 IMPLANT
CUFF TOURNIQUET SINGLE 18IN (TOURNIQUET CUFF) IMPLANT
DRAPE EXTREMITY T 121X128X90 (DRAPE) ×2 IMPLANT
DRAPE OEC MINIVIEW 54X84 (DRAPES) ×2 IMPLANT
GAUZE SPONGE 4X4 12PLY STRL (GAUZE/BANDAGES/DRESSINGS) IMPLANT
GAUZE SPONGE 4X4 16PLY XRAY LF (GAUZE/BANDAGES/DRESSINGS) IMPLANT
GAUZE XEROFORM 1X8 LF (GAUZE/BANDAGES/DRESSINGS) ×2 IMPLANT
GLOVE BIOGEL PI IND STRL 7.0 (GLOVE) ×1 IMPLANT
GLOVE BIOGEL PI IND STRL 8 (GLOVE) IMPLANT
GLOVE BIOGEL PI IND STRL 8.5 (GLOVE) ×1 IMPLANT
GLOVE BIOGEL PI INDICATOR 7.0 (GLOVE) ×1
GLOVE BIOGEL PI INDICATOR 8 (GLOVE)
GLOVE BIOGEL PI INDICATOR 8.5 (GLOVE) ×1
GLOVE ECLIPSE 6.5 STRL STRAW (GLOVE) ×4 IMPLANT
GLOVE SURG ORTHO 8.0 STRL STRW (GLOVE) ×2 IMPLANT
GOWN STRL REUS W/ TWL LRG LVL3 (GOWN DISPOSABLE) ×1 IMPLANT
GOWN STRL REUS W/TWL LRG LVL3 (GOWN DISPOSABLE) ×1
GOWN STRL REUS W/TWL XL LVL3 (GOWN DISPOSABLE) ×2 IMPLANT
K-WIRE .035X4 (WIRE) ×2 IMPLANT
NEEDLE PRECISIONGLIDE 27X1.5 (NEEDLE) ×2 IMPLANT
NS IRRIG 1000ML POUR BTL (IV SOLUTION) IMPLANT
PACK BASIN DAY SURGERY FS (CUSTOM PROCEDURE TRAY) ×2 IMPLANT
PAD CAST 3X4 CTTN HI CHSV (CAST SUPPLIES) IMPLANT
PADDING CAST COTTON 3X4 STRL (CAST SUPPLIES)
SPLINT PLASTER CAST XFAST 3X15 (CAST SUPPLIES) IMPLANT
SPLINT PLASTER XTRA FASTSET 3X (CAST SUPPLIES)
STOCKINETTE 4X48 STRL (DRAPES) ×2 IMPLANT
STRIP CLOSURE SKIN 1/2X4 (GAUZE/BANDAGES/DRESSINGS) ×2 IMPLANT
SUT ETHILON 4 0 PS 2 18 (SUTURE) IMPLANT
SYR BULB 3OZ (MISCELLANEOUS) ×2 IMPLANT
SYR CONTROL 10ML LL (SYRINGE) ×2 IMPLANT

## 2015-11-11 NOTE — Op Note (Addendum)
Dictation Number [id number] Intra-operative fluoroscopic images in the AP, lateral, and oblique views were taken and evaluated by myself.  Reduction of the fracture and hardware placement were confirmed.

## 2015-11-11 NOTE — Brief Op Note (Signed)
11/11/2015  1:41 PM  PATIENT:  [name]  50 y.o. female  PRE-OPERATIVE DIAGNOSIS:  FRACTURE RIGHT SMALL FINGER PROXIMAL PHANLANX  POST-OPERATIVE DIAGNOSIS:  FRACTURE RIGHT SMALL FINGER PROXIMAL PHANLANX  PROCEDURE:  Procedure(s): PERCUTANEOUS PINNING PROXIMAL PHALANX RIGHT SMALL FINGER  OPEN (Right)  SURGEON:  Surgeon(s) and Role:    * Cindee Salt, MD - Primary  PHYSICIAN ASSISTANT:   ASSISTANTS: none   ANESTHESIA:   local and general  EBL:  Total I/O In: 800 [I.V.:800] Out: -   BLOOD ADMINISTERED:none  DRAINS: none   LOCAL MEDICATIONS USED:  BUPIVICAINE   SPECIMEN:  No Specimen  DISPOSITION OF SPECIMEN:  N/A  COUNTS:  YES  TOURNIQUET:    DICTATION: .Other Dictation: Dictation Number [id number]  PLAN OF CARE: Discharge to home after PACU  PATIENT DISPOSITION:  PACU - hemodynamically stable.

## 2015-11-11 NOTE — Anesthesia Preprocedure Evaluation (Addendum)
Anesthesia Evaluation  Patient identified by MRN, date of birth, ID band Patient awake    Reviewed: Allergy & Precautions, NPO status , Patient's Chart, lab work & pertinent test results  Airway Mallampati: II  TM Distance: >3 FB Neck ROM: Full    Dental  (+) Partial Upper   Pulmonary asthma , sleep apnea , former smoker,    breath sounds clear to auscultation       Cardiovascular hypertension,  Rhythm:Regular Rate:Normal     Neuro/Psych PSYCHIATRIC DISORDERS Anxiety Depression    GI/Hepatic Neg liver ROS, GERD  Medicated,  Endo/Other  negative endocrine ROS  Renal/GU negative Renal ROS  negative genitourinary   Musculoskeletal  (+) Arthritis ,   Abdominal   Peds negative pediatric ROS (+)  Hematology negative hematology ROS (+)   Anesthesia Other Findings   Reproductive/Obstetrics negative OB ROS                            Lab Results  Component Value Date   WBC 5.8 09/24/2014   HGB 10.3* 09/24/2014   HCT 31.4* 09/24/2014   MCV 79.1 09/24/2014   PLT 296 09/24/2014   Lab Results  Component Value Date   CREATININE 0.64 09/24/2014   BUN 16 09/24/2014   NA 140 09/24/2014   K 3.8 09/24/2014   CL 102 09/24/2014   CO2 29 09/24/2014   No results found for: INR, PROTIME   Anesthesia Physical Anesthesia Plan  ASA: III  Anesthesia Plan: General   Post-op Pain Management:    Induction: Intravenous  Airway Management Planned: LMA  Additional Equipment:   Intra-op Plan:   Post-operative Plan: Extubation in OR  Informed Consent: I have reviewed the patients History and Physical, chart, labs and discussed the procedure including the risks, benefits and alternatives for the proposed anesthesia with the patient or authorized representative who has indicated his/her understanding and acceptance.     Plan Discussed with: CRNA  Anesthesia Plan Comments:        Anesthesia  Quick Evaluation

## 2015-11-11 NOTE — Anesthesia Postprocedure Evaluation (Signed)
Anesthesia Post Note  Patient: [name]  Procedure(s) Performed: Procedure(s) (LRB): PERCUTANEOUS PINNING PROXIMAL PHALANX RIGHT SMALL FINGER  OPEN (Right)  Patient location during evaluation: PACU Anesthesia Type: General Level of consciousness: awake and alert Pain management: pain level controlled Vital Signs Assessment: post-procedure vital signs reviewed and stable Respiratory status: spontaneous breathing, nonlabored ventilation, respiratory function stable and patient connected to nasal cannula oxygen Cardiovascular status: blood pressure returned to baseline and stable Postop Assessment: no signs of nausea or vomiting Anesthetic complications: no    Last Vitals:  Filed Vitals:   11/11/15 1415 11/11/15 1425  BP: 116/61 118/68  Pulse: 63   Temp:  36.5 C  Resp: 14 18    Last Pain:  Filed Vitals:   11/11/15 1426  PainSc: 2                  Shelton Silvas
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
## 2015-11-11 NOTE — Discharge Instructions (Addendum)

## 2015-11-11 NOTE — Op Note (Signed)
[name], ZEIGLER NO.:  [id number]  MEDICAL RECORD NO.:  [id number]  LOCATION:                                 FACILITY:  PHYSICIAN:  Cindee Salt, M.D.       DATE OF BIRTH:  15-Oct-1965  DATE OF PROCEDURE:  11/11/2015 DATE OF DISCHARGE:                              OPERATIVE REPORT   PREOPERATIVE DIAGNOSIS:  Fractured proximal phalanx of proximal aspect, right small finger.  POSTOPERATIVE DIAGNOSIS:  Fractured proximal phalanx of proximal aspect, right small finger.  OPERATION:  Closed reduction and percutaneous pinning of proximal phalanx fracture, right small finger.  SURGEON:  Cindee Salt, MD.  ANESTHESIA:  General with local infiltration.  ANESTHESIOLOGIST:  Hall.  HISTORY:  The patient is a 50 year old female, who suffered a fall with a proximal phalanx fracture of her right small finger which is angulated.  She is admitted for closed reduction and percutaneous pinning.  Pre, peri, and postoperative courses have been discussed along with risks and complications.  She is aware that there is no guarantee with the surgery; possibility of infection; recurrence of injury to arteries, nerves, tendons; incomplete relief of symptoms; dystrophy; the possibility of nonunion; stiffness to the finger and/or joint.  PROCEDURE IN DETAIL:  The patient was brought to the operating room, where a general anesthetic was carried out without difficulty.  She was prepped using ChloraPrep, supine position with the right arm free.  A 3- minute dry time was allowed.  Time-out taken, confirming the patient and procedure.  She was given antibiotics due to the small avulsion of skin on the little finger.  The image intensifier was brought into the field and two 35 K-wires were then inserted under image intensification, across the fracture site stabilizing in both AP and lateral direction. It was noted that she had some overlap of her small finger which was present on her  opposite side also which was checked prior to the procedure.  X-rays confirmed reduction of the fracture in both AP and lateral direction.  The pins were bent, cut short.  The skin avulsion was Steri-Stripped, a sterile compressive dressing.  Ulnar gutter splint was applied.  A local infiltration metacarpal block was given with 0.25% bupivacaine without epinephrine, 8 mL was used.  The patient tolerated the procedure well.  She was taken to the recovery room for observation in satisfactory condition.  She will be discharged home to return to the [facility] in 1 week, on [place].         ______________________________ Cindee Salt, M.D.    GK/MEDQ  D:  11/11/2015  T:  11/11/2015  Job:  [id number]

## 2015-11-11 NOTE — Transfer of Care (Signed)
Immediate Anesthesia Transfer of Care Note  Patient: [name]  Procedure(s) Performed: Procedure(s): PERCUTANEOUS PINNING PROXIMAL PHALANX RIGHT SMALL FINGER  OPEN (Right)  Patient Location: PACU  Anesthesia Type:General  Level of Consciousness: awake, alert , oriented and patient cooperative  Airway & Oxygen Therapy: Patient Spontanous Breathing and Patient connected to face mask oxygen  Post-op Assessment: Report given to RN and Post -op Vital signs reviewed and stable  Post vital signs: Reviewed and stable  Last Vitals:  Filed Vitals:   11/11/15 1224  BP: 120/53  Pulse: 66  Temp: 36.9 C  Resp: 16    Complications: No apparent anesthesia complications

## 2015-11-11 NOTE — Anesthesia Procedure Notes (Signed)
Procedure Name: LMA Insertion Date/Time: 11/11/2015 1:12 PM Performed by: Cristopher Ciccarelli D Pre-anesthesia Checklist: Patient identified, Emergency Drugs available, Suction available and Patient being monitored Patient Re-evaluated:Patient Re-evaluated prior to inductionOxygen Delivery Method: Circle System Utilized Preoxygenation: Pre-oxygenation with 100% oxygen Intubation Type: IV induction Ventilation: Mask ventilation without difficulty LMA: LMA inserted LMA Size: 4.0 Number of attempts: 1 Airway Equipment and Method: Bite block Placement Confirmation: positive ETCO2 Tube secured with: Tape Dental Injury: Teeth and Oropharynx as per pre-operative assessment

## 2015-11-12 ENCOUNTER — Encounter (HOSPITAL_BASED_OUTPATIENT_CLINIC_OR_DEPARTMENT_OTHER): Payer: Self-pay | Admitting: Orthopedic Surgery

## 2016-01-12 ENCOUNTER — Ambulatory Visit: Payer: 59 | Admitting: Dietician

## 2016-04-01 IMAGING — CR DG FINGER LITTLE 2+V*R*
1 series · 1 of 1 positions shown · non-contrast
Comparison: None.

CLINICAL DATA: Patient fell while jogging about an hour ago.
Difficulty with plantar flexion, dorsiflexion, and tenderness at the
PIP.

EXAM:
RIGHT LITTLE FINGER 2+V

[PA]
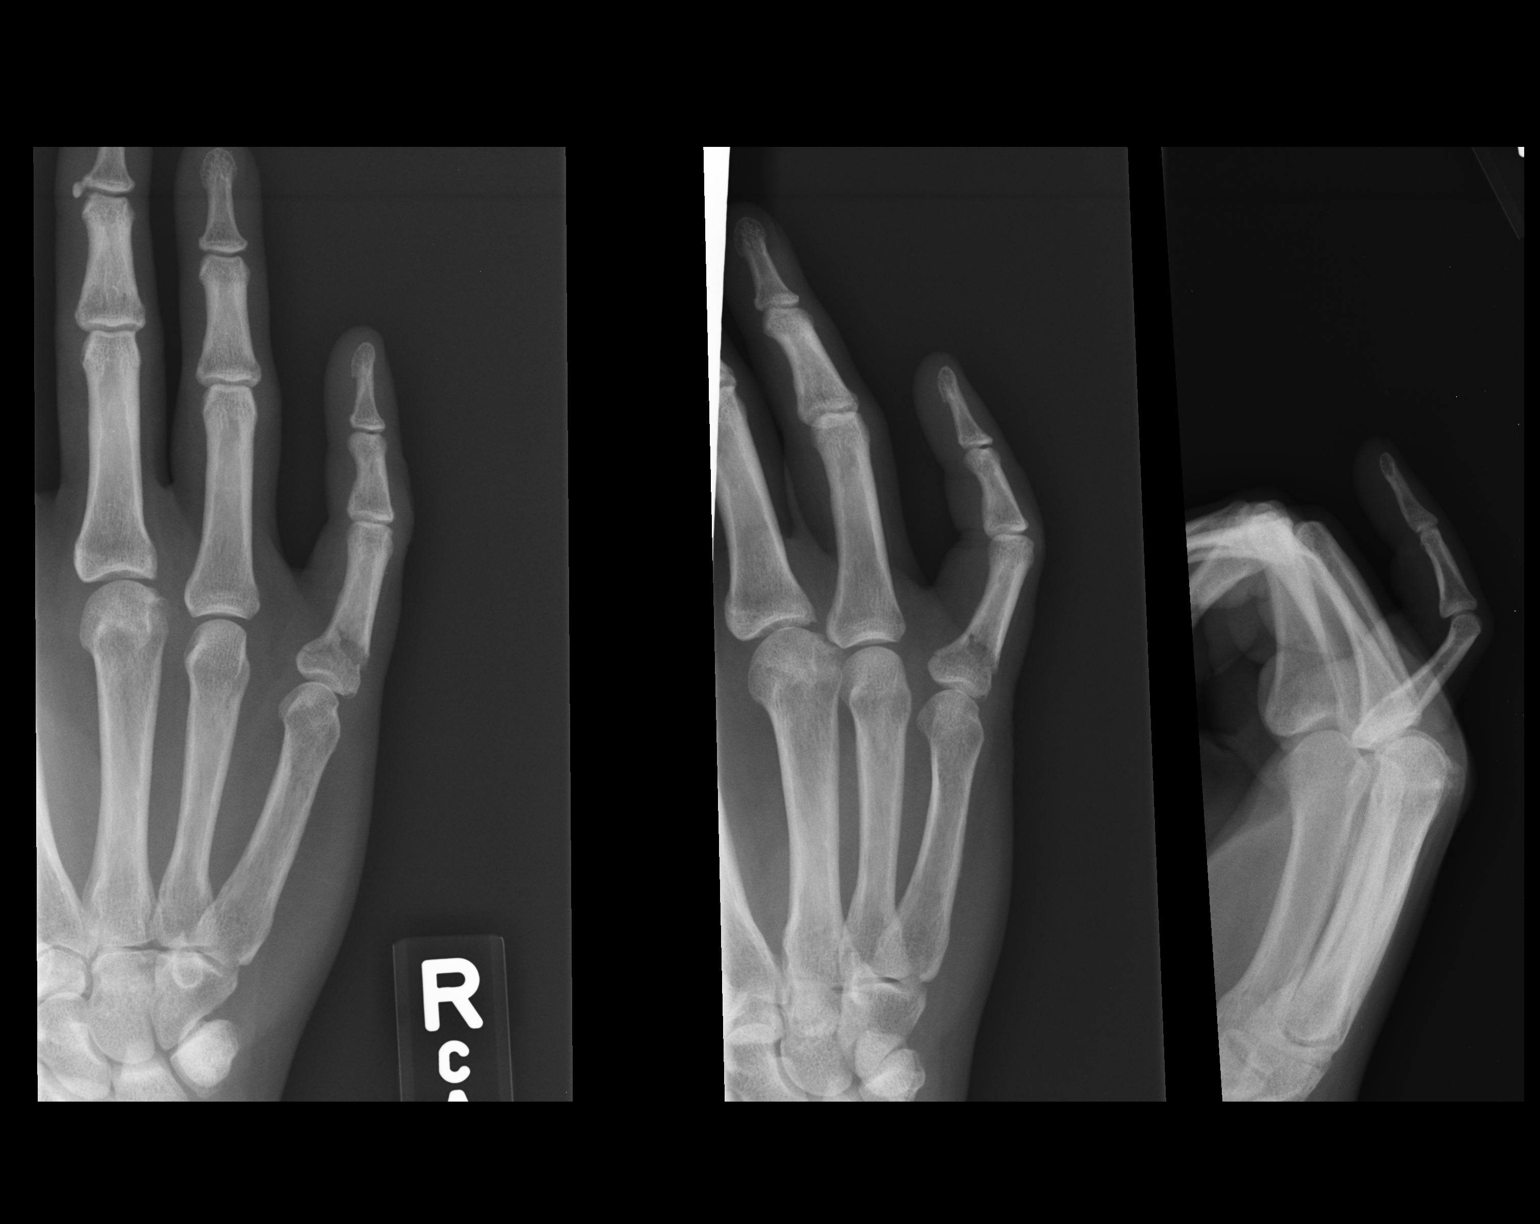

[1 of 1 positions shown; findings below may reference images not displayed]

FINDINGS: There is an acute mildly comminuted fracture of the proximal aspect
proximal phalanx right fifth finger. There is dorsal angulation of
the distal fracture fragments. Fracture lines do not appear to
extend to the articular surface. Soft tissue swelling.
IMPRESSION: Acute mildly comminuted fracture of the proximal aspect proximal
phalanx right fifth finger.

## 2016-11-24 DIAGNOSIS — E785 Hyperlipidemia, unspecified: Secondary | ICD-10-CM | POA: Diagnosis not present

## 2016-11-24 DIAGNOSIS — Z0000 Encounter for general adult medical examination without abnormal findings: Secondary | ICD-10-CM | POA: Diagnosis not present

## 2017-01-04 ENCOUNTER — Encounter (HOSPITAL_COMMUNITY): Payer: Self-pay

## 2017-01-20 DIAGNOSIS — Z79899 Other long term (current) drug therapy: Secondary | ICD-10-CM | POA: Diagnosis not present

## 2017-01-20 DIAGNOSIS — D509 Iron deficiency anemia, unspecified: Secondary | ICD-10-CM | POA: Diagnosis not present

## 2017-01-20 DIAGNOSIS — D863 Sarcoidosis of skin: Secondary | ICD-10-CM | POA: Diagnosis not present

## 2017-01-24 DIAGNOSIS — D649 Anemia, unspecified: Secondary | ICD-10-CM | POA: Diagnosis not present

## 2017-01-24 DIAGNOSIS — D72829 Elevated white blood cell count, unspecified: Secondary | ICD-10-CM | POA: Diagnosis not present

## 2017-01-24 DIAGNOSIS — D863 Sarcoidosis of skin: Secondary | ICD-10-CM | POA: Diagnosis not present

## 2017-02-28 DIAGNOSIS — Z1231 Encounter for screening mammogram for malignant neoplasm of breast: Secondary | ICD-10-CM | POA: Diagnosis not present

## 2017-03-09 DIAGNOSIS — Z0100 Encounter for examination of eyes and vision without abnormal findings: Secondary | ICD-10-CM | POA: Diagnosis not present

## 2017-03-28 DIAGNOSIS — D869 Sarcoidosis, unspecified: Secondary | ICD-10-CM | POA: Diagnosis not present

## 2017-03-28 DIAGNOSIS — R5383 Other fatigue: Secondary | ICD-10-CM | POA: Diagnosis not present

## 2017-03-28 DIAGNOSIS — R21 Rash and other nonspecific skin eruption: Secondary | ICD-10-CM | POA: Diagnosis not present

## 2017-06-27 ENCOUNTER — Other Ambulatory Visit: Payer: Self-pay

## 2017-06-27 ENCOUNTER — Inpatient Hospital Stay (HOSPITAL_COMMUNITY): Payer: 59

## 2017-06-27 ENCOUNTER — Emergency Department (HOSPITAL_COMMUNITY): Payer: 59

## 2017-06-27 ENCOUNTER — Encounter (HOSPITAL_COMMUNITY): Payer: Self-pay | Admitting: Emergency Medicine

## 2017-06-27 ENCOUNTER — Inpatient Hospital Stay (HOSPITAL_COMMUNITY)
Admission: EM | Admit: 2017-06-27 | Discharge: 2017-06-29 | DRG: 065 | Disposition: A | Discharge status: Home Health | Payer: 59 | Attending: Internal Medicine | Admitting: Internal Medicine

## 2017-06-27 DIAGNOSIS — J45909 Unspecified asthma, uncomplicated: Secondary | ICD-10-CM | POA: Diagnosis present

## 2017-06-27 DIAGNOSIS — G8194 Hemiplegia, unspecified affecting left nondominant side: Secondary | ICD-10-CM | POA: Diagnosis present

## 2017-06-27 DIAGNOSIS — D863 Sarcoidosis of skin: Secondary | ICD-10-CM | POA: Diagnosis present

## 2017-06-27 DIAGNOSIS — M349 Systemic sclerosis, unspecified: Secondary | ICD-10-CM | POA: Diagnosis present

## 2017-06-27 DIAGNOSIS — M1990 Unspecified osteoarthritis, unspecified site: Secondary | ICD-10-CM | POA: Diagnosis present

## 2017-06-27 DIAGNOSIS — F329 Major depressive disorder, single episode, unspecified: Secondary | ICD-10-CM | POA: Diagnosis present

## 2017-06-27 DIAGNOSIS — M542 Cervicalgia: Secondary | ICD-10-CM | POA: Diagnosis present

## 2017-06-27 DIAGNOSIS — D860 Sarcoidosis of lung: Secondary | ICD-10-CM | POA: Diagnosis present

## 2017-06-27 DIAGNOSIS — D72819 Decreased white blood cell count, unspecified: Secondary | ICD-10-CM | POA: Diagnosis present

## 2017-06-27 DIAGNOSIS — I63321 Cerebral infarction due to thrombosis of right anterior cerebral artery: Secondary | ICD-10-CM | POA: Diagnosis not present

## 2017-06-27 DIAGNOSIS — K219 Gastro-esophageal reflux disease without esophagitis: Secondary | ICD-10-CM | POA: Diagnosis present

## 2017-06-27 DIAGNOSIS — R29810 Facial weakness: Secondary | ICD-10-CM | POA: Diagnosis not present

## 2017-06-27 DIAGNOSIS — S61218A Laceration without foreign body of other finger without damage to nail, initial encounter: Secondary | ICD-10-CM | POA: Diagnosis present

## 2017-06-27 DIAGNOSIS — I4510 Unspecified right bundle-branch block: Secondary | ICD-10-CM | POA: Diagnosis present

## 2017-06-27 DIAGNOSIS — S62347A Nondisplaced fracture of base of fifth metacarpal bone. left hand, initial encounter for closed fracture: Secondary | ICD-10-CM | POA: Diagnosis not present

## 2017-06-27 DIAGNOSIS — D649 Anemia, unspecified: Secondary | ICD-10-CM | POA: Diagnosis present

## 2017-06-27 DIAGNOSIS — M48061 Spinal stenosis, lumbar region without neurogenic claudication: Secondary | ICD-10-CM | POA: Diagnosis not present

## 2017-06-27 DIAGNOSIS — Z87891 Personal history of nicotine dependence: Secondary | ICD-10-CM | POA: Diagnosis not present

## 2017-06-27 DIAGNOSIS — R768 Other specified abnormal immunological findings in serum: Secondary | ICD-10-CM | POA: Diagnosis not present

## 2017-06-27 DIAGNOSIS — I6789 Other cerebrovascular disease: Secondary | ICD-10-CM | POA: Diagnosis not present

## 2017-06-27 DIAGNOSIS — I6340 Cerebral infarction due to embolism of unspecified cerebral artery: Secondary | ICD-10-CM | POA: Diagnosis not present

## 2017-06-27 DIAGNOSIS — M21372 Foot drop, left foot: Secondary | ICD-10-CM | POA: Diagnosis present

## 2017-06-27 DIAGNOSIS — M6281 Muscle weakness (generalized): Secondary | ICD-10-CM | POA: Diagnosis not present

## 2017-06-27 DIAGNOSIS — E669 Obesity, unspecified: Secondary | ICD-10-CM | POA: Diagnosis present

## 2017-06-27 DIAGNOSIS — M2142 Flat foot [pes planus] (acquired), left foot: Secondary | ICD-10-CM

## 2017-06-27 DIAGNOSIS — Z79899 Other long term (current) drug therapy: Secondary | ICD-10-CM

## 2017-06-27 DIAGNOSIS — D869 Sarcoidosis, unspecified: Secondary | ICD-10-CM | POA: Diagnosis not present

## 2017-06-27 DIAGNOSIS — X58XXXA Exposure to other specified factors, initial encounter: Secondary | ICD-10-CM | POA: Diagnosis present

## 2017-06-27 DIAGNOSIS — Z09 Encounter for follow-up examination after completed treatment for conditions other than malignant neoplasm: Secondary | ICD-10-CM

## 2017-06-27 DIAGNOSIS — F419 Anxiety disorder, unspecified: Secondary | ICD-10-CM | POA: Diagnosis present

## 2017-06-27 DIAGNOSIS — M47814 Spondylosis without myelopathy or radiculopathy, thoracic region: Secondary | ICD-10-CM | POA: Diagnosis present

## 2017-06-27 DIAGNOSIS — R29701 NIHSS score 1: Secondary | ICD-10-CM | POA: Diagnosis present

## 2017-06-27 DIAGNOSIS — W19XXXA Unspecified fall, initial encounter: Secondary | ICD-10-CM | POA: Diagnosis not present

## 2017-06-27 DIAGNOSIS — R9431 Abnormal electrocardiogram [ECG] [EKG]: Secondary | ICD-10-CM | POA: Diagnosis not present

## 2017-06-27 DIAGNOSIS — Z9884 Bariatric surgery status: Secondary | ICD-10-CM

## 2017-06-27 DIAGNOSIS — Z886 Allergy status to analgesic agent status: Secondary | ICD-10-CM

## 2017-06-27 DIAGNOSIS — R29898 Other symptoms and signs involving the musculoskeletal system: Secondary | ICD-10-CM | POA: Diagnosis not present

## 2017-06-27 DIAGNOSIS — S92355A Nondisplaced fracture of fifth metatarsal bone, left foot, initial encounter for closed fracture: Secondary | ICD-10-CM | POA: Diagnosis not present

## 2017-06-27 DIAGNOSIS — F319 Bipolar disorder, unspecified: Secondary | ICD-10-CM | POA: Diagnosis present

## 2017-06-27 DIAGNOSIS — I10 Essential (primary) hypertension: Secondary | ICD-10-CM | POA: Diagnosis present

## 2017-06-27 DIAGNOSIS — G4733 Obstructive sleep apnea (adult) (pediatric): Secondary | ICD-10-CM | POA: Diagnosis present

## 2017-06-27 DIAGNOSIS — Z7289 Other problems related to lifestyle: Secondary | ICD-10-CM

## 2017-06-27 DIAGNOSIS — R531 Weakness: Secondary | ICD-10-CM | POA: Diagnosis not present

## 2017-06-27 DIAGNOSIS — Z6835 Body mass index (BMI) 35.0-35.9, adult: Secondary | ICD-10-CM

## 2017-06-27 DIAGNOSIS — G8190 Hemiplegia, unspecified affecting unspecified side: Secondary | ICD-10-CM | POA: Diagnosis not present

## 2017-06-27 LAB — COMPREHENSIVE METABOLIC PANEL
ALT: 23 U/L (ref 14–54)
AST: 28 U/L (ref 15–41)
Albumin: 4.3 g/dL (ref 3.5–5.0)
Alkaline Phosphatase: 103 U/L (ref 38–126)
Anion gap: 9 (ref 5–15)
BUN: 10 mg/dL (ref 6–20)
CO2: 29 mmol/L (ref 22–32)
Calcium: 9.5 mg/dL (ref 8.9–10.3)
Chloride: 103 mmol/L (ref 101–111)
Creatinine, Ser: 1.07 mg/dL — ABNORMAL HIGH (ref 0.44–1.00)
GFR calc Af Amer: >60 mL/min (ref >60)
GFR calc non Af Amer: 59 mL/min — ABNORMAL LOW (ref >60)
Glucose, Bld: 93 mg/dL (ref 65–99)
Potassium: 3.9 mmol/L (ref 3.5–5.1)
Sodium: 141 mmol/L (ref 135–145)
Total Bilirubin: 0.7 mg/dL (ref 0.3–1.2)
Total Protein: 7.2 g/dL (ref 6.5–8.1)

## 2017-06-27 LAB — APTT: aPTT: 26 seconds (ref 24–36)

## 2017-06-27 LAB — CBC
HCT: 34.8 % — ABNORMAL LOW (ref 36.0–46.0)
Hemoglobin: 11.4 g/dL — ABNORMAL LOW (ref 12.0–15.0)
MCH: 29.2 pg (ref 26.0–34.0)
MCHC: 32.8 g/dL (ref 30.0–36.0)
MCV: 89.2 fL (ref 78.0–100.0)
Platelets: 227 10*3/uL (ref 150–400)
RBC: 3.9 MIL/uL (ref 3.87–5.11)
RDW: 15.5 % (ref 11.5–15.5)
WBC: 2.6 10*3/uL — ABNORMAL LOW (ref 4.0–10.5)

## 2017-06-27 LAB — DIFFERENTIAL
Basophils Absolute: 0 10*3/uL (ref 0.0–0.1)
Basophils Relative: 0 %
Eosinophils Absolute: 0 10*3/uL (ref 0.0–0.7)
Eosinophils Relative: 1 %
Lymphocytes Relative: 34 %
Lymphs Abs: 0.9 10*3/uL (ref 0.7–4.0)
Monocytes Absolute: 0.2 10*3/uL (ref 0.1–1.0)
Monocytes Relative: 9 %
Neutro Abs: 1.5 10*3/uL — ABNORMAL LOW (ref 1.7–7.7)
Neutrophils Relative %: 56 %

## 2017-06-27 LAB — PROTIME-INR
INR: 1.01
Prothrombin Time: 13.2 seconds (ref 11.4–15.2)

## 2017-06-27 LAB — POCT I-STAT, CHEM 8
BUN: 8 mg/dL (ref 6–20)
Calcium, Ion: 1.19 mmol/L (ref 1.15–1.40)
Chloride: 102 mmol/L (ref 101–111)
Creatinine, Ser: 1.1 mg/dL — ABNORMAL HIGH (ref 0.44–1.00)
Glucose, Bld: 87 mg/dL (ref 65–99)
HCT: 40 % (ref 36.0–46.0)
Hemoglobin: 13.6 g/dL (ref 12.0–15.0)
Potassium: 3.8 mmol/L (ref 3.5–5.1)
Sodium: 142 mmol/L (ref 135–145)
TCO2: 28 mmol/L (ref 22–32)

## 2017-06-27 LAB — I-STAT BETA HCG BLOOD, ED (NOT ORDERABLE): I-stat hCG, quantitative: <5 m[IU]/mL (ref <5)

## 2017-06-27 LAB — POCT I-STAT TROPONIN I: Troponin i, poc: 0 ng/mL (ref 0.00–0.08)

## 2017-06-27 LAB — ETHANOL: Alcohol, Ethyl (B): <5 mg/dL (ref <5)

## 2017-06-27 MED ORDER — HEPARIN SODIUM (PORCINE) 5000 UNIT/ML IJ SOLN
5000.0000 [IU] | Freq: Three times a day (TID) | INTRAMUSCULAR | Status: DC
  Administered 2017-06-27 – 2017-06-29 (×5): 5000 [IU] via SUBCUTANEOUS
  Filled 2017-06-27 (×6): qty 1

## 2017-06-27 MED ORDER — FOLIC ACID 0.5 MG HALF TAB
500.0000 ug | ORAL_TABLET | Freq: Every day | ORAL | Status: DC
  Filled 2017-06-27: qty 1

## 2017-06-27 MED ORDER — BUPROPION HCL ER (XL) 150 MG PO TB24
300.0000 mg | ORAL_TABLET | Freq: Every day | ORAL | Status: DC
  Administered 2017-06-27 – 2017-06-28 (×2): 300 mg via ORAL
  Filled 2017-06-27 (×3): qty 2

## 2017-06-27 MED ORDER — METHOTREXATE 2.5 MG PO TABS: 7.5000 mg | ORAL_TABLET | ORAL | Status: DC

## 2017-06-27 MED ORDER — FOLIC ACID 1 MG PO TABS
1.0000 mg | ORAL_TABLET | Freq: Every day | ORAL | Status: DC
  Administered 2017-06-28: 1 mg via ORAL
  Filled 2017-06-27 (×2): qty 1

## 2017-06-27 MED ORDER — HYDROXYCHLOROQUINE SULFATE 200 MG PO TABS
400.0000 mg | ORAL_TABLET | Freq: Every day | ORAL | Status: DC
  Administered 2017-06-27 – 2017-06-28 (×2): 400 mg via ORAL
  Filled 2017-06-27 (×3): qty 2

## 2017-06-27 MED ORDER — ESCITALOPRAM OXALATE 10 MG PO TABS
20.0000 mg | ORAL_TABLET | Freq: Every day | ORAL | Status: DC
  Administered 2017-06-27 – 2017-06-28 (×2): 20 mg via ORAL
  Filled 2017-06-27 (×3): qty 2

## 2017-06-27 NOTE — ED Provider Notes (Signed)
[facility]-EMERGENCY DEPT Provider Note   CSN: [id number] Arrival date & time: 06/27/17  1043     History   Chief Complaint Chief Complaint  Patient presents with  . left leg numbness    HPI [name] is a 51 y.o. female with a history of sarcoidosis (pulmonary and skin), obstructive sleep apnea, hypertension, arthritis, asthma who presents emergency department today for left leg weakness since Saturday, 06/25/2017. The patient reports prior to Saturday evening she had no weakness or numbness of the left leg. Upon standing from the couch, to atempt to ambulate to the restroom Saturday evening, she lost function of the left foot and ankle, stating she had to drag the foot/lower portion of her left leg. This resulted in a fall, due to inversion of the ankle while trying to bear weight on it. She states that she may have hit her head against the wall when she fell but has not had any loss of consciousness. Since the event the patient has had inability to plantar flex, dorsiflex, invert or evert or the ankle. She is also unable to move any of the toes on the left foot. She is still able to move the left knee but it is more difficult than normal. The patient notes no tingling of the leg. She is having pain at the base of the 5th MTP which show avulsion fracture from xrays taken in triage. She is without history of low back pain, or surgery. No bowel or bladder incontinence. No saddle anesthesia. No urinary retention. She does note that she has occassional neck pain, that is bilateral and worse after lying down at night. She states "i just can't get comfortable". No preceding trauma. She denies fever, HA, upper exremity weakness, CP, SOB, facial asymmetry, difficulty with speech, LOC, emesis. She notes she has been midly nausish as of late.    HPI  Past Medical History:  Diagnosis Date  . Allergic rhinitis   . Anemia   . Anxiety   . Arthritis   . Asthma       . Depression   . GERD  (gastroesophageal reflux disease)   . Goiter   . Hypertension    no meds since wt loss  . OSA (obstructive sleep apnea)    cpap- setting at 9, no CPAP since weight loss  . Sarcoidosis    Pulmonary, skin involvement    Patient Active Problem List   Diagnosis Date Noted  . Weakness of left foot 06/27/2017  . Morbid obesity, initial weight - 300, BMI - 48.5 01/07/2014  . Mild persistent asthma, well controlled 07/26/2012  . SLEEP APNEA, OBSTRUCTIVE 01/10/2008  . Pulmonary sarcoidosis (HCC) 07/20/2007    Past Surgical History:  Procedure Laterality Date  . ABDOMINAL HYSTERECTOMY    . BREATH TEK H PYLORI N/A 01/29/2014   Procedure: BREATH TEK H PYLORI;  Surgeon: Kandis Cocking, MD;  Location: [facility];  Service: General;  Laterality: N/A;  . LAPAROSCOPIC GASTRIC SLEEVE RESECTION N/A 06/04/2014   Procedure: LAPAROSCOPIC GASTRIC SLEEVE RESECTION;  Surgeon: Kandis Cocking, MD;  Location: [facility];  Service: General;  Laterality: N/A;  . OVARIAN CYST REMOVAL    . PERCUTANEOUS PINNING Right 11/11/2015   Procedure: PERCUTANEOUS PINNING PROXIMAL PHALANX RIGHT SMALL FINGER  OPEN;  Surgeon: Cindee Salt, MD;  Location: [facility];  Service: Orthopedics;  Laterality: Right;  . UPPER GI ENDOSCOPY  06/04/2014   Procedure: UPPER GI ENDOSCOPY;  Surgeon: Kandis Cocking, MD;  Location:  [facility];  Service: General;;    OB History    No data available       Home Medications    Prior to Admission medications   Medication Sig Start Date End Date Taking? Authorizing Provider  buPROPion (WELLBUTRIN XL) 300 MG 24 hr tablet Take 300 mg by mouth daily.   Yes [provider]  escitalopram (LEXAPRO) 20 MG tablet Take 20 mg by mouth daily.   Yes [provider]  folic acid (FOLVITE) 400 MCG tablet Take 400 mcg by mouth daily.   Yes [provider]  hydroxychloroquine (PLAQUENIL) 200 MG tablet Take 400 mg by mouth daily.   Yes [provider]  methotrexate  (RHEUMATREX) 2.5 MG tablet Take 7.5-10 mg by mouth 2 (two) times a week. Take 4 tablets Tuesday and 3 tablets on Wednesday......Marland KitchenCaution:Chemotherapy. Protect from light.   Yes [provider]    Family History Family History  Problem Relation Age of Onset  . Heart disease Mother   . Kidney disease Father   . Sarcoidosis Brother   . Sarcoidosis Unknown        niece  . Hyperlipidemia Other   . Hypertension Other   . Stroke Other   . Obesity Other     Social History Social History  Substance Use Topics  . Smoking status: Former Smoker    Packs/day: 0.50    Years: 12.00    Quit date: 10/11/1996  . Smokeless tobacco: Former Neurosurgeon  . Alcohol use Yes     Comment: occassional     Allergies   Ibuprofen   Review of Systems Review of Systems  All other systems reviewed and are negative.    Physical Exam Updated Vital Signs BP (!) 144/90   Temp 98.7 F (37.1 C)   Resp 15   Physical Exam  Constitutional: She appears well-developed and well-nourished.  HENT:  Head: Normocephalic and atraumatic.  Right Ear: External ear normal.  Left Ear: External ear normal.  Eyes: Pupils are equal, round, and reactive to light. Conjunctivae and EOM are normal. Right eye exhibits no discharge. Left eye exhibits no discharge. No scleral icterus.  Neck: Normal range of motion. Neck supple.  Pulmonary/Chest: Effort normal. No respiratory distress.  Neurological: She is alert.  Speech clear. Follows commands. No facial droop. PERRLA. EOMI. Normal peripheral fields.  Cranial Nerves:  II:  Pupils equal, round, reactive to light III,IV, VI: ptosis not present, extra-ocular motions intact bilaterally  V,VII: smile symmetric, facial light touch sensation equal VIII: hearing grossly normal bilaterally  IX,X: midline uvula rise  XI: bilateral shoulder shrug equal and strong XII: midline tongue extension Grossly moves upper extremities without ataxia. Appropriate strength including  grip strength b/l for upper extremities.  Right leg with intact hip flexion and strength, knee extension, knee flexion, ankle plantar flexion and dorsiflexion. Able to move all digits of toes. Strength 5/5. Left hip with intact hip flexion and strength. Knee flexion and extension able but decreased extension of knee noted. Knee flexion strength equal b/l. Inability to demonstrate ROM of the ankle (plantar flexion, dorsiflexion, inversion and eversion) or toes of the left ankle and foot. Mild clonus.  Sensation to light touch intact bilaterally for upper and lower. Patellar deep tendon reflex 2+ and equal bilaterally.No pronator drift. Cannot demonstrate heel to shin for left leg.   Skin: Skin is warm. No erythema. No pallor.  No erythema, heat, induration or fluctuance of the foot or ankle.   Psychiatric: She has  a normal mood and affect.  Nursing note and vitals reviewed.      ED Treatments / Results  Labs (all labs ordered are listed, but only abnormal results are displayed) Labs Reviewed  CBC - Abnormal; Notable for the following:       Result Value   WBC 2.6 (*)    Hemoglobin 11.4 (*)    HCT 34.8 (*)    All other components within normal limits  DIFFERENTIAL - Abnormal; Notable for the following:    Neutro Abs 1.5 (*)    All other components within normal limits  COMPREHENSIVE METABOLIC PANEL - Abnormal; Notable for the following:    Creatinine, Ser 1.07 (*)    GFR calc non Af Amer 59 (*)    All other components within normal limits  POCT I-STAT, CHEM 8 - Abnormal; Notable for the following:    Creatinine, Ser 1.10 (*)    All other components within normal limits  ETHANOL  PROTIME-INR  APTT  RAPID URINE DRUG SCREEN, HOSP PERFORMED  URINALYSIS, ROUTINE W REFLEX MICROSCOPIC  I-STAT CHEM 8, ED  I-STAT TROPONIN, ED  I-STAT BETA HCG BLOOD, ED ([facility], [facility], [facility] ONLY)  POCT I-STAT TROPONIN I  I-STAT BETA HCG BLOOD, ED (NOT ORDERABLE)    EKG  EKG Interpretation None        Radiology Dg Ankle Complete Left  Result Date: 06/27/2017 CLINICAL DATA:  Lateral ankle pain, status post multiple falls EXAM: LEFT ANKLE COMPLETE - 3+ VIEW COMPARISON:  None. FINDINGS: Suspected avulsion fracture involving the base of the 5th metatarsal. Distal tibia and fibula appear intact. The ankle mortise is intact. Visualized soft tissues are within normal limits. IMPRESSION: Avulsion fracture involving the base of the 5th metatarsal. Electronically Signed   By: Charline Bills M.D.   On: 06/27/2017 11:33    Procedures Procedures (including critical care time)  Medications Ordered in ED Medications  heparin injection 5,000 Units (not administered)  buPROPion (WELLBUTRIN XL) 24 hr tablet 300 mg (not administered)  escitalopram (LEXAPRO) tablet 20 mg (not administered)  folic acid (FOLVITE) tablet 400 mcg (not administered)  hydroxychloroquine (PLAQUENIL) tablet 400 mg (not administered)     Initial Impression / Assessment and Plan / ED Course  I have reviewed the triage vital signs and the nursing notes.  Pertinent labs & imaging results that were available during my care of the patient were reviewed by me and considered in my medical decision making (see chart for details).     This is a 51 year old female with new onset weakness of the left lower leg. This first occurred Saturday evening, 06/25/2017.There is no preceding trauma, or weakness. She is without history of back pain or back surgery. The patient did have episode of ankle and inversion due to having to drag her foot when walking. Xrays taken in traige show avulsion fracture involving the base of the fifth metatarsal. The patient's vital signs are reassuring on presentation. PE with inability to demonstrate ROM of the ankle (plantar flexion, dorsiflexion, inversion and eversion) or toes of the left ankle and foot. There is mild clonus. The patient also shows difficulty with heel to shin balance on the left side. Neuro  exam otherwise unremarkable as above. Pulses strong for the foot and ankle and confirmed with doppler. The patient is grossly intact to light touch to superficial peroneal, deep peroneal and sural nerve distrubution's. Discussed case with Dr. Rubin Payor who advised neuro consult and blood work. Consult to neurology returned by Theodoro Grist, Cordelia Poche  who advised the patient receive MRI w/o contrast brain and low back, [facility] under hospitalist service who will then have to re-consult neurology. Blood work reassuring. Dr.  Eston Esters to admit patient. Patient made aware and in agreement with plan.   Final Clinical Impressions(s) / ED Diagnoses   Final diagnoses:  Weakness of left lower extremity  Closed nondisplaced fracture of base of fifth metacarpal bone of left hand, initial encounter    New Prescriptions New Prescriptions   No medications on file     Princella Pellegrini 06/27/17 1650    Benjiman Core, MD 06/28/17 0700    Benjiman Core, MD 06/28/17 [phone]

## 2017-06-27 NOTE — ED Triage Notes (Addendum)
Per pt, states left leg numbness since last Saturday-states not having trouble walking-states she fell yesterday due to not being able to move left leg-patient able to raise/lower left leg without difficulty-states not able to bear weight, weak-no injury or trauma

## 2017-06-27 NOTE — Progress Notes (Addendum)
Patient arrived to unit, vitals stable, no complaints at this time. Tele applied and verified. LLE below knee is cold to touch, palpable posterior tibial and pedal pulses. MD paged. MD returned page, no further orders received at this time. Continue to monitor patient.

## 2017-06-27 NOTE — H&P (Signed)
History and Physical  [name] ZOX:[id number] DOB: 12-13-1965 DOA: 06/27/2017  PCP:  Shirlean Mylar, MD   Chief Complaint:  Left foot drop  History of Present Illness:  Pt is a 51 yo female who came with cc of sudden left foot drop 3 days ago while she was sitting watching TV not associated with any other focal findings including no slurred speech, facial droop, leg/arm numbness or other motor deficits. She had to drag her foot and tripped over it causing some pain later but otherwise no symptoms/complaints. No hx of trauma to the foot.   Review of Systems:  CONSTITUTIONAL:     No night sweats.  No fatigue.  No fever. No chills. Eyes:                            No visual changes.  No eye pain.  No eye discharge.   ENT:                              No epistaxis.  No sinus pain.  No sore throat.   No congestion. RESPIRATORY:           No cough.  No wheeze.  No hemoptysis.  No dyspnea CARDIOVASCULAR   :  No chest pains.  No palpitations. GASTROINTESTINAL:  No abdominal pain.  No nausea. No vomiting.  No diarrhea. No     constipation.  No hematemesis.  No hematochezia.  No melena. GENITOURINARY:      No urgency.  No frequency.  No dysuria.  No hematuria.  No    obstructive symptoms.  No discharge.  No pain.   MUSCULOSKELETAL:  No musculoskeletal pain.  No joint swelling.  +arthritis. NEUROLOGICAL:        No confusion.  +weakness. No headache. No seizure. PSYCHIATRIC:             No depression. No anxiety. No suicidal ideation. SKIN:                             No rashes.  No lesions.  No wounds. ENDOCRINE:                No weight loss.  No polydipsia.  No polyuria.  No polyphagia. HEMATOLOGIC:           No purpura.  No petechiae.  No bleeding.  ALLERGIC                 : No pruritus.  No angioedema Other:  Past Medical and Surgical History:   Past Medical History:  Diagnosis Date  . Allergic rhinitis   . Anemia   . Anxiety   . Arthritis   . Asthma       . Depression     . GERD (gastroesophageal reflux disease)   . Goiter   . Hypertension    no meds since wt loss  . OSA (obstructive sleep apnea)    cpap- setting at 9, no CPAP since weight loss  . Sarcoidosis    Pulmonary, skin involvement   Past Surgical History:  Procedure Laterality Date  . ABDOMINAL HYSTERECTOMY    . BREATH TEK H PYLORI N/A 01/29/2014   Procedure: BREATH TEK H PYLORI;  Surgeon: Kandis Cocking, MD;  Location: [facility];  Service: General;  Laterality: N/A;  . LAPAROSCOPIC GASTRIC  SLEEVE RESECTION N/A 06/04/2014   Procedure: LAPAROSCOPIC GASTRIC SLEEVE RESECTION;  Surgeon: Kandis Cocking, MD;  Location: [facility];  Service: General;  Laterality: N/A;  . OVARIAN CYST REMOVAL    . PERCUTANEOUS PINNING Right 11/11/2015   Procedure: PERCUTANEOUS PINNING PROXIMAL PHALANX RIGHT SMALL FINGER  OPEN;  Surgeon: Cindee Salt, MD;  Location: [facility];  Service: Orthopedics;  Laterality: Right;  . UPPER GI ENDOSCOPY  06/04/2014   Procedure: UPPER GI ENDOSCOPY;  Surgeon: Kandis Cocking, MD;  Location: [facility];  Service: General;;    Social History:   reports that she quit smoking about 20 years ago. She has a 6.00 pack-year smoking history. She has quit using smokeless tobacco. She reports that she drinks alcohol. She reports that she does not use drugs.    Allergies  Allergen Reactions  . Ibuprofen Itching    Family History  Problem Relation Age of Onset  . Heart disease Mother   . Kidney disease Father   . Sarcoidosis Brother   . Sarcoidosis Unknown        niece  . Hyperlipidemia Other   . Hypertension Other   . Stroke Other   . Obesity Other      Prior to Admission medications   Medication Sig Start Date End Date Taking? Authorizing Provider  buPROPion (WELLBUTRIN XL) 300 MG 24 hr tablet Take 300 mg by mouth daily.   Yes [provider]  escitalopram (LEXAPRO) 20 MG tablet Take 20 mg by mouth daily.   Yes [provider]  folic acid (FOLVITE) 400  MCG tablet Take 400 mcg by mouth daily.   Yes [provider]  hydroxychloroquine (PLAQUENIL) 200 MG tablet Take 400 mg by mouth daily.   Yes [provider]  methotrexate (RHEUMATREX) 2.5 MG tablet Take 7.5-10 mg by mouth 2 (two) times a week. Take 4 tablets Tuesday and 3 tablets on Wednesday......Marland KitchenCaution:Chemotherapy. Protect from light.   Yes [provider]    Physical Exam: BP (!) 144/90   Temp 98.7 F (37.1 C)   Resp 15   GENERAL :   Alert and cooperative, and appears to be in no acute distress. HEAD:           normocephalic. EYES:            PERRL, EOMI.  vision is grossly intact. EARS:           hearing grossly intact. NECK:          supple, non-tender. No lymphadenopathy CARDIAC:    Normal S1 and S2. No gallop. No murmurs.  Vascular:     no peripheral edema.  LUNGS:       Clear to auscultation  ABDOMEN: Positive bowel sounds. Soft, nondistended, nontender. No guarding or rebound.      MSK:           No joint erythema or tenderness. EXT           : No significant deformity or joint abnormality. Neuro        : Alert, oriented to person, place, and time.                      CN II-XII intact.                       Left foot drop with absence of dorsiflexion/extention  SKIN:            No  rash. No lesions. PSYCH:       No hallucination. Patient is not suicidal.          Labs on Admission:  Reviewed.   Radiological Exams on Admission: Dg Ankle Complete Left  Result Date: 06/27/2017 CLINICAL DATA:  Lateral ankle pain, status post multiple falls EXAM: LEFT ANKLE COMPLETE - 3+ VIEW COMPARISON:  None. FINDINGS: Suspected avulsion fracture involving the base of the 5th metatarsal. Distal tibia and fibula appear intact. The ankle mortise is intact. Visualized soft tissues are within normal limits. IMPRESSION: Avulsion fracture involving the base of the 5th metatarsal. Electronically Signed   By: Charline Bills M.D.   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 Lumbar neuropathy vs. Central infarct Will check MRI brain/MRI lumbar Consult neurology who requested pt being transferred to [facility] Will check folate/vit B12 level Hold MTX in setting of possible neuropathy   Arthritis: cont plaquenil   Input & Output: NA Lines & Tubes: PIV DVT prophylaxis: [place] hep GI prophylaxis: NA Consultants: Neuro Code Status: full  Family Communication: at bedside  Disposition Plan: transfer to [facility]     Eston Esters M.D [facility]

## 2017-06-27 NOTE — Consult Note (Signed)
Admission H&P     Referring physician: Dr. Dreama Saa Chief Complaint: sudden onset weakness involving left lower extremity.  HPI: [name] is an 51 y.o. female this is a 51 year old lady with a his depression, anxiety and arthritis presenting with weakness of sudden onset involving left lower extremity, mostly distally, for 3 days. She has not been able to move her ankle and also unable to move her toes. She has not experienced numbness involving left lower extremity. She's had no symptoms involving left upper extremity. There is been no change in speech and no facial droop. She has no history of stroke or TIA. Imaging studies of the brain are pending.  LSN: 06/24/2017 tPA Given: No: beyond time window for treatment consideration. mRankin:  Past Medical History:  Diagnosis Date  . Allergic rhinitis   . Anemia   . Anxiety   . Arthritis   . Asthma       . Depression   . GERD (gastroesophageal reflux disease)   . Goiter   . Hypertension    no meds since wt loss  . OSA (obstructive sleep apnea)    cpap- setting at 9, no CPAP since weight loss  . Sarcoidosis    Pulmonary, skin involvement    Past Surgical History:  Procedure Laterality Date  . ABDOMINAL HYSTERECTOMY    . BREATH TEK H PYLORI N/A 01/29/2014   Procedure: BREATH TEK H PYLORI;  Surgeon: Shann Medal, MD;  Location: [facility];  Service: General;  Laterality: N/A;  . LAPAROSCOPIC GASTRIC SLEEVE RESECTION N/A 06/04/2014   Procedure: [place];  Surgeon: Shann Medal, MD;  Location: [facility];  Service: General;  Laterality: N/A;  . OVARIAN CYST REMOVAL    . PERCUTANEOUS PINNING Right 11/11/2015   Procedure: PERCUTANEOUS PINNING PROXIMAL PHALANX RIGHT SMALL FINGER  OPEN;  Surgeon: Daryll Brod, MD;  Location: [facility];  Service: Orthopedics;  Laterality: Right;  . UPPER GI ENDOSCOPY  06/04/2014   Procedure: UPPER GI ENDOSCOPY;  Surgeon: Shann Medal, MD;  Location: [facility];   Service: General;;    Family History  Problem Relation Age of Onset  . Heart disease Mother   . Kidney disease Father   . Sarcoidosis Brother   . Sarcoidosis Unknown        niece  . Hyperlipidemia Other   . Hypertension Other   . Stroke Other   . Obesity Other    Social History:  reports that she quit smoking about 20 years ago. She has a 6.00 pack-year smoking history. She has quit using smokeless tobacco. She reports that she drinks alcohol. She reports that she does not use drugs.  Allergies:  Allergies  Allergen Reactions  . Ibuprofen Itching    Medications Prior to Admission  Medication Sig Dispense Refill  . buPROPion (WELLBUTRIN XL) 300 MG 24 hr tablet Take 300 mg by mouth daily.    Marland Kitchen escitalopram (LEXAPRO) 20 MG tablet Take 20 mg by mouth daily.    . folic acid (FOLVITE) 606 MCG tablet Take 400 mcg by mouth daily.    . hydroxychloroquine (PLAQUENIL) 200 MG tablet Take 400 mg by mouth daily.    . methotrexate (RHEUMATREX) 2.5 MG tablet Take 7.5-10 mg by mouth 2 (two) times a week. Take 4 tablets Tuesday and 3 tablets on Wednesday......Marland KitchenCaution:Chemotherapy. Protect from light.      ROS: History obtained from the patient  General ROS: negative for - chills, fatigue, fever, night sweats, weight  gain or weight loss Psychological ROS: negative for - behavioral disorder, hallucinations, memory difficulties, mood swings or suicidal ideation Ophthalmic ROS: negative for - blurry vision, double vision, eye pain or loss of vision ENT ROS: negative for - epistaxis, nasal discharge, oral lesions, sore throat, tinnitus or vertigo Allergy and Immunology ROS: negative for - hives or itchy/watery eyes Hematological and Lymphatic ROS: negative for - bleeding problems, bruising or swollen lymph nodes Endocrine ROS: negative for - galactorrhea, hair pattern changes, polydipsia/polyuria or temperature intolerance Respiratory ROS: negative for - cough, hemoptysis, shortness of breath or  wheezing Cardiovascular ROS: negative for - chest pain, dyspnea on exertion, edema or irregular heartbeat Gastrointestinal ROS: negative for - abdominal pain, diarrhea, hematemesis, nausea/vomiting or stool incontinence Genito-Urinary ROS: negative for - dysuria, hematuria, incontinence or urinary frequency/urgency Musculoskeletal ROS: negative for - joint swelling or muscular weakness Neurological ROS: as noted in HPI Dermatological ROS: negative for rash and skin lesion changes  Physical Examination: Blood pressure (!) 144/64, temperature 98.6 F (37 C), temperature source Oral, resp. rate 16, SpO2 100 %.  HEENT-  Normocephalic, no lesions, without obvious abnormality.  Normal external eye and conjunctiva.  Normal TM's bilaterally.  Normal auditory canals and external ears. Normal external nose, mucus membranes and septum.  Normal pharynx. Neck supple with no masses, nodes, nodules or enlargement. Cardiovascular - regular rate and rhythm, S1, S2 normal, no murmur, click, rub or gallop Lungs - chest clear, no wheezing, rales, normal symmetric air entry Abdomen - soft, non-tender; bowel sounds normal; no masses,  no organomegaly Extremities - no joint deformities, effusion, or inflammation  Neurologic Examination: Mental Status: Alert, oriented, thought content appropriate.  Speech fluent without evidence of aphasia. Able to follow commands without difficulty. Cranial Nerves: II-Visual fields were normal. III/IV/VI-Pupils were equal and reacted. Extraocular movements were full and conjugate.    V/VII-no facial numbness and no facial weakness. VIII-normal. X-normal speech and symmetrical palatal movement. XI: trapezius strength/neck flexion strength normal bilaterally XII-midline tongue extension with normal strength. Motor: motor exam showed moderate proximal and severe distal right lower extremity weakness. Strength of dorsiflexion and plantar flexion of the foot was 0/5. She also had  no intact flexion nor extension of her toes. Motor exam was otherwise unremarkable. Sensory: Normal throughout, including left lower extremity. Deep Tendon Reflexes: asymmetric and lower extremities with greater responses elicited and left lower extremity and right lower extremity. Plantars: Mute bilaterally Cerebellar: Normal finger-to-nose testing. Carotid auscultation: Normal  Results for orders placed or performed during the hospital encounter of 06/27/17 (from the past 48 hour(s))  Ethanol     Status: None   Collection Time: 06/27/17  2:55 PM  Result Value Ref Range   Alcohol, Ethyl (B) <5 <5 mg/dL    Comment:        LOWEST DETECTABLE LIMIT FOR SERUM ALCOHOL IS 5 mg/dL FOR MEDICAL PURPOSES ONLY   Protime-INR     Status: None   Collection Time: 06/27/17  2:55 PM  Result Value Ref Range   Prothrombin Time 13.2 11.4 - 15.2 seconds   INR 1.01   APTT     Status: None   Collection Time: 06/27/17  2:55 PM  Result Value Ref Range   aPTT 26 24 - 36 seconds  CBC     Status: Abnormal   Collection Time: 06/27/17  2:55 PM  Result Value Ref Range   WBC 2.6 (L) 4.0 - 10.5 K/uL   RBC 3.90 3.87 - 5.11 MIL/uL   Hemoglobin 11.4 (  L) 12.0 - 15.0 g/dL   HCT 34.8 (L) 36.0 - 46.0 %   MCV 89.2 78.0 - 100.0 fL   MCH 29.2 26.0 - 34.0 pg   MCHC 32.8 30.0 - 36.0 g/dL   RDW 15.5 11.5 - 15.5 %   Platelets 227 150 - 400 K/uL  Differential     Status: Abnormal   Collection Time: 06/27/17  2:55 PM  Result Value Ref Range   Neutrophils Relative % 56 %   Neutro Abs 1.5 (L) 1.7 - 7.7 K/uL   Lymphocytes Relative 34 %   Lymphs Abs 0.9 0.7 - 4.0 K/uL   Monocytes Relative 9 %   Monocytes Absolute 0.2 0.1 - 1.0 K/uL   Eosinophils Relative 1 %   Eosinophils Absolute 0.0 0.0 - 0.7 K/uL   Basophils Relative 0 %   Basophils Absolute 0.0 0.0 - 0.1 K/uL  Comprehensive metabolic panel     Status: Abnormal   Collection Time: 06/27/17  2:55 PM  Result Value Ref Range   Sodium 141 135 - 145 mmol/L    Potassium 3.9 3.5 - 5.1 mmol/L   Chloride 103 101 - 111 mmol/L   CO2 29 22 - 32 mmol/L   Glucose, Bld 93 65 - 99 mg/dL   BUN 10 6 - 20 mg/dL   Creatinine, Ser 1.07 (H) 0.44 - 1.00 mg/dL   Calcium 9.5 8.9 - 10.3 mg/dL   Total Protein 7.2 6.5 - 8.1 g/dL   Albumin 4.3 3.5 - 5.0 g/dL   AST 28 15 - 41 U/L   ALT 23 14 - 54 U/L   Alkaline Phosphatase 103 38 - 126 U/L   Total Bilirubin 0.7 0.3 - 1.2 mg/dL   GFR calc non Af Amer 59 (L) >60 mL/min   GFR calc Af Amer >60 >60 mL/min    Comment: (NOTE) The eGFR has been calculated using the CKD EPI equation. This calculation has not been validated in all clinical situations. eGFR's persistently <60 mL/min signify possible Chronic Kidney Disease.    Anion gap 9 5 - 15  POCT i-Stat troponin I     Status: None   Collection Time: 06/27/17  3:05 PM  Result Value Ref Range   Troponin i, poc 0.00 0.00 - 0.08 ng/mL   Comment 3            Comment: Due to the release kinetics of cTnI, a negative result within the first hours of the onset of symptoms does not rule out myocardial infarction with certainty. If myocardial infarction is still suspected, repeat the test at appropriate intervals.   I-Stat beta hCG blood, ED     Status: None   Collection Time: 06/27/17  3:06 PM  Result Value Ref Range   I-stat hCG, quantitative <5.0 <5 mIU/mL   Comment 3            Comment:   GEST. AGE      CONC.  (mIU/mL)   <=1 WEEK        5 - 50     2 WEEKS       50 - 500     3 WEEKS       100 - 10,000     4 WEEKS     1,000 - 30,000        FEMALE AND NON-PREGNANT FEMALE:     LESS THAN 5 mIU/mL   I-STAT, chem 8     Status: Abnormal   Collection Time: 06/27/17  3:08 PM  Result Value Ref Range   Sodium 142 135 - 145 mmol/L   Potassium 3.8 3.5 - 5.1 mmol/L   Chloride 102 101 - 111 mmol/L   BUN 8 6 - 20 mg/dL   Creatinine, Ser 1.10 (H) 0.44 - 1.00 mg/dL   Glucose, Bld 87 65 - 99 mg/dL   Calcium, Ion 1.19 1.15 - 1.40 mmol/L   TCO2 28 22 - 32 mmol/L   Hemoglobin  13.6 12.0 - 15.0 g/dL   HCT 40.0 36.0 - 46.0 %   Dg Ankle Complete Left  Result Date: 06/27/2017 CLINICAL DATA:  Lateral ankle pain, status post multiple falls EXAM: LEFT ANKLE COMPLETE - 3+ VIEW COMPARISON:  None. FINDINGS: Suspected avulsion fracture involving the base of the 5th metatarsal. Distal tibia and fibula appear intact. The ankle mortise is intact. Visualized soft tissues are within normal limits. IMPRESSION: Avulsion fracture involving the base of the 5th metatarsal. Electronically Signed   By: Julian Hy M.D.   On: 06/27/2017 11:33    Assessment: 51 y.o. female presenting with probable acuteright ACA territory ischemic stroke.   Stroke Risk Factors - family history and hypertension  Plan: 1. HgbA1c, fasting lipid panel 2. MRI, MRA  of the brain without contrast 3. PT consult 4. Echocardiogram 5. Carotid dopplers 6. Prophylactic therapy-Antiplatelet med: Aspirin 7. Risk factor modification 8. Telemetry monitoring  C.R. Nicole Kindred, MD [facility] [phone]  06/27/2017, 7:37 PM

## 2017-06-28 ENCOUNTER — Inpatient Hospital Stay (HOSPITAL_COMMUNITY): Payer: 59

## 2017-06-28 ENCOUNTER — Encounter (HOSPITAL_COMMUNITY): Payer: Self-pay

## 2017-06-28 DIAGNOSIS — I63321 Cerebral infarction due to thrombosis of right anterior cerebral artery: Secondary | ICD-10-CM

## 2017-06-28 DIAGNOSIS — R29898 Other symptoms and signs involving the musculoskeletal system: Secondary | ICD-10-CM

## 2017-06-28 DIAGNOSIS — I6789 Other cerebrovascular disease: Secondary | ICD-10-CM

## 2017-06-28 LAB — ECHOCARDIOGRAM COMPLETE
E decel time: 187 msec
E/e' ratio: 0.67
FS: 37 % (ref 28–44)
Height: 66 in
IVS/LV PW RATIO, ED: 0.94
LA ID, A-P, ES: 37 mm
LA diam end sys: 37 mm
LA diam index: 1.69 cm/m2
LA vol A4C: 62.7 ml
LV E/e' medial: 0.67
LV E/e'average: 0.67
LV PW d: 10.4 mm — AB (ref 0.6–1.1)
LVOT SV: 63 mL
LVOT VTI: 20.1 cm
LVOT area: 3.14 cm2
LVOT diameter: 20 mm
LVOT peak grad rest: 4 mmHg
LVOT peak vel: 98.8 cm/s
MV Dec: 187
MV pk A vel: 70.3 m/s
MV pk E vel: 8.05 m/s
TAPSE: 18.7 mm
TDI e' medial: 12.1
Weight: 3485.03 oz

## 2017-06-28 LAB — URINALYSIS, ROUTINE W REFLEX MICROSCOPIC
Bilirubin Urine: NEGATIVE
Glucose, UA: NEGATIVE mg/dL
Hgb urine dipstick: NEGATIVE
Ketones, ur: NEGATIVE mg/dL
Nitrite: NEGATIVE
Protein, ur: NEGATIVE mg/dL
Specific Gravity, Urine: 1.016 (ref 1.005–1.030)
pH: 6 (ref 5.0–8.0)

## 2017-06-28 LAB — SEDIMENTATION RATE: Sed Rate: 7 mm/hr (ref 0–22)

## 2017-06-28 LAB — RAPID URINE DRUG SCREEN, HOSP PERFORMED
Amphetamines: NOT DETECTED
Barbiturates: NOT DETECTED
Benzodiazepines: NOT DETECTED
Cocaine: NOT DETECTED
Opiates: NOT DETECTED
Tetrahydrocannabinol: NOT DETECTED

## 2017-06-28 LAB — LIPID PANEL
Cholesterol: 171 mg/dL (ref 0–200)
HDL: 67 mg/dL (ref >40)
LDL Cholesterol: 96 mg/dL (ref 0–99)
Total CHOL/HDL Ratio: 2.6 RATIO
Triglycerides: 41 mg/dL (ref <150)
VLDL: 8 mg/dL (ref 0–40)

## 2017-06-28 LAB — HEMOGLOBIN A1C
Hgb A1c MFr Bld: 5 % (ref 4.8–5.6)
Mean Plasma Glucose: 96.8 mg/dL

## 2017-06-28 MED ORDER — METHOTREXATE 2.5 MG PO TABS: 7.5000 mg | ORAL_TABLET | ORAL | Status: DC

## 2017-06-28 MED ORDER — ATORVASTATIN CALCIUM 80 MG PO TABS
80.0000 mg | ORAL_TABLET | Freq: Every day | ORAL | Status: DC
  Administered 2017-06-28: 80 mg via ORAL
  Filled 2017-06-28: qty 1

## 2017-06-28 MED ORDER — ASPIRIN 325 MG PO TABS
325.0000 mg | ORAL_TABLET | Freq: Every day | ORAL | Status: DC
  Administered 2017-06-28: 325 mg via ORAL
  Filled 2017-06-28 (×2): qty 1

## 2017-06-28 MED ORDER — METHOTREXATE 2.5 MG PO TABS
10.0000 mg | ORAL_TABLET | ORAL | Status: DC
  Administered 2017-06-28: 10 mg via ORAL
  Filled 2017-06-28: qty 4

## 2017-06-28 MED ORDER — METHOTREXATE 2.5 MG PO TABS
7.5000 mg | ORAL_TABLET | ORAL | Status: DC
  Filled 2017-06-28: qty 3

## 2017-06-28 NOTE — Progress Notes (Signed)
STROKE TEAM PROGRESS NOTE   HISTORY OF PRESENT ILLNESS (per record) [name] is an 51 y.o. female with a history of sarcoidosis, depression, anxiety and arthritis who presented with weakness of sudden onset involving left lower extremity, mostly distally, for 3 days. She has not been able to move her ankle and also unable to move her toes. She has not experienced numbness involving left lower extremity. She's had no symptoms involving left upper extremity. There is been no change in speech and no facial droop. She has no history of stroke or TIA. Imaging studies of the brain are pending.  LSN: 06/24/2017 tPA Given: No: beyond time window for treatment consideration. mRankin:  Patient was not administered IV t-PA secondary to arriving outside of the tPA treatment window. She was admitted to General Neurology for further evaluation and treatment.   SUBJECTIVE (INTERVAL HISTORY) Her sister is at the bedside.  The pt is awake, alert, and oriented.  Sustained clonus noted in L foot.  L-sided weakness persists.  Takes plaquenil and methotrexate for sarcoidosis.  ANA, APA, and ACE labs ordered.  TEE pending.   OBJECTIVE Temp:  [98.6 F (37 C)-99.2 F (37.3 C)] 98.6 F (37 C) (09/18 0954) Pulse Rate:  [74-80] 80 (09/18 0954) Cardiac Rhythm: Normal sinus rhythm;Bundle branch block (09/18 0700) Resp:  [15-18] 18 (09/18 0954) BP: (105-144)/(50-90) 116/83 (09/18 0954) SpO2:  [97 %-100 %] 100 % (09/18 0954) Weight:  [98.8 kg (217 lb 13 oz)] 98.8 kg (217 lb 13 oz) (09/18 0500)  CBC:   Recent Labs Lab 06/27/17 1455 06/27/17 1508  WBC 2.6*  --   NEUTROABS 1.5*  --   HGB 11.4* 13.6  HCT 34.8* 40.0  MCV 89.2  --   PLT 227  --     Basic Metabolic Panel:   Recent Labs Lab 06/27/17 1455 06/27/17 1508  NA 141 142  K 3.9 3.8  CL 103 102  CO2 29  --   GLUCOSE 93 87  BUN 10 8  CREATININE 1.07* 1.10*  CALCIUM 9.5  --     Lipid Panel:     Component Value Date/Time   CHOL 171  06/28/2017 0932   TRIG 41 06/28/2017 0932   HDL 67 06/28/2017 0932   CHOLHDL 2.6 06/28/2017 0932   VLDL 8 06/28/2017 0932   LDLCALC 96 06/28/2017 0932   HgbA1c:  Lab Results  Component Value Date   HGBA1C 5.0 06/28/2017   Urine Drug Screen:     Component Value Date/Time   LABOPIA NONE DETECTED 06/28/2017 1002   COCAINSCRNUR NONE DETECTED 06/28/2017 1002   LABBENZ NONE DETECTED 06/28/2017 1002   AMPHETMU NONE DETECTED 06/28/2017 1002   THCU NONE DETECTED 06/28/2017 1002   LABBARB NONE DETECTED 06/28/2017 1002    Alcohol Level     Component Value Date/Time   ETH <5 06/27/2017 1455    IMAGING  Dg Ankle Complete Left 06/27/2017 IMPRESSION: Avulsion fracture involving the base of the 5th metatarsal.  Mr [facility] Contrast Mr Brain Wo Contrast (neuro Protocol) 06/27/2017 IMPRESSION: 1. Multifocal acute ischemia within the right hemisphere at the watershed junctions between the right ACA and MCA and right MCA and PCA. 2. No hemorrhage or mass effect. 3. Normal intracranial MRA.  TTE 06/28/2017 Study Conclusions - Left ventricle: The cavity size was normal. Systolic function was normal. The estimated ejection fraction was in the range of 55% to 60%. Wall motion was normal; there were no regional wall motion abnormalities. Left ventricular  diastolic function parameters were normal.  Carotid US 06/28/2017 1-39% ICA plaquing. Vertebral artery flow is antegrade  TEE 06/28/2017 pending   PHYSICAL EXAM Pleasant middle aged lady not in distress. . Afebrile. Head is nontraumatic. Neck is supple without bruit.    Cardiac exam no murmur or gallop. Lungs are clear to auscultation. Distal pulses are well felt. Neurological Exam :  Awake alert oriented x 3 normal speech and language.Intact attention registration and recall. Mild left lower face asymmetry. Tongue midline. Mild left lower extremity drift. Mild diminished fine finger movements on left. Orbits right over left upper  extremity. Mild left grip weak.. Left lower extremity 4/5 strength proximally and left foot drop with 2/5 ankle dorsiflexors. Increased tone in the left lower extremity with sustained left ankle clonus. Normal sensation . Normal coordination. Deep tendon reflexes are brisk throughout but left greater than right. Gait not tested  ASSESSMENT/PLAN [name] is a 50 y.o. female with history ofJudy L Kidd is an 51 y.o. female with a history of depression, anxiety and arthritis who presented with weakness of sudden onset involving left lower extremity, mostly distally, for 3 days. She did not receive IV t-PA due to presenting outside of the tPA treatment window.     Stroke: Multifocal R hemisphere acute infarsts at watershed junctions between ACA and MCA, and MCA and PCA, likely embolic, of undetermined etiology.  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  MRA head: no LVO or high-grade stenosis  Carotid Doppler: 1-39% ICA plaquing. Vertebral artery flow is antegrade  2D Echo: EF 55-60%. No source of embolus   LDL 96  HgbA1c 5  Heparin 1,610 units every 8 hours, for VTE prophylaxis Diet Heart Room service appropriate? Yes; Fluid consistency: Thin  No antithrombotic prior to admission, now on No antithromboti aspirin 325 mg  Patient counseled to be compliant with her antithrombotic medications  Ongoing aggressive stroke risk factor management  Therapy recommendations: pending  Disposition: pending  Hyperlipidemia  Home meds: none  LDL 96, goal < 70  Add atorvastatin 20 mg PO daily  Continue statin at discharge  Other Stroke Risk Factors  ETOH use, advised to drink no more than 1 drink(s) a day  Obesity, Body mass index is 35.16 kg/m., recommend weight loss, diet and exercise as appropriate    Family hx stroke (mother)  OSA  Other Active Problems  None  Hospital day # 1  I have personally examined this patient, reviewed notes, independently viewed imaging studies, participated in medical decision making and plan of care.ROS completed by me personally and pertinent positives fully documented  I have made any additions or clarifications directly to the above note.  She has presented with subacute left arm and face weakness and MRI scan does suggest the watershed) infarcts but surprisingly there is no large vessel stenosis noted intracranially or extracranially. The presence of mostly periventricular and corpus callosal lesions raises the possibility of demyelinating disease at her age hence we'll check MRI scan of the brain with contrast as well as cervical and thoracic spine. Transesophageal echocardiogram to look for cardiac source of embolism. Start aspirin and Lipitor. Greater than 50% time during this 35 minute visit was spent on counseling and coordination of care about her strokes and plan for evaluation, treatment and prevention discussion. Discussed with  Dr. Mahala Menghini. Delia Heady, MD Medical Director [facility] Pager: [phone] 06/28/2017 4:11 PM   To contact Stroke Continuity provider, please refer to [email or web address]. After hours, contact General Neurology

## 2017-06-28 NOTE — Progress Notes (Signed)
  Echocardiogram 2D Echocardiogram has been performed.  [name] 06/28/2017, 9:34 AM

## 2017-06-28 NOTE — Progress Notes (Signed)
Orthopedic Tech Progress Note Patient Details:  [name] [birth year] [id number]  Ortho Devices Type of Ortho Device: CAM walker Ortho Device/Splint Location: LLE Ortho Device/Splint Interventions: Ordered, Application   Jennye Moccasin 06/28/2017, 4:17 PM

## 2017-06-28 NOTE — Care Management Note (Signed)
Case Management Note  Patient Details  Name: [name] MRN: [id number] Date of Birth: 06-Sep-1966  Subjective/Objective:    Pt admitted with CVA and lt ankle fracture. She is from home with family.              Action/Plan: No orders for PT/OT. MD please order if appropriate. CM following for d/c needs, physician orders.   Expected Discharge Date:                  Expected Discharge Plan:     In-House Referral:     Discharge planning Services     Post Acute Care Choice:    Choice offered to:     DME Arranged:    DME Agency:     HH Arranged:    HH Agency:     Status of Service:  In process, will continue to follow  If discussed at Long Length of Stay Meetings, dates discussed:    Additional Comments:  Kermit Balo, RN 06/28/2017, 11:11 AM

## 2017-06-28 NOTE — Progress Notes (Signed)
Lab called r/t patient's urine specimen order from [facility]. RN reorder per request.   Sim Boast, RN

## 2017-06-28 NOTE — Progress Notes (Signed)
PROGRESS NOTE    [name]  ZOX:[id number] DOB: 1966/06/16 DOA: 06/27/2017 PCP: Shirlean Mylar, MD   Specialists:     Brief Narrative:  51  OSA on cpap Mild p Asthma 5th finger lac Sarcoid-Foll Dr.  Zenovia Jordan since April 2018 -on plaquenil and MTX Body mass index is 35.16 kg/m. s/p Gastric  Sleeve resection    Admit with sudden foot drop Found to have CVA on MIR  Neuro consulted  Assessment & Plan:   Active Problems:   Weakness of left foot   CVA  Per neuro would get TEE    Await rest of work-up including Sarcoid labs   Has foot drop as main sym--getting splint for foot OT/PT eval to be done  Sarcoid  Under good control  Will give MTX today and tomorrow dose  Cont plaquenil  Hypertension  Not currently on meds  Status post gastric sleeve procedure Dr. Ezzard Standing 2015  Doing well from this perspective  Bipolar-stable  Continue Lexapro 20 daily, Wellbutrin XL 300 daily Full CODE STATUS Inpatient Discussed with sister Will need TEE and further workup   Consultants:   neurology  Procedures:   [facility]() 9/18  ------------------------------------------------------------------- Study Conclusions  - Left ventricle: The cavity size was normal. Systolic function was   normal. The estimated ejection fraction was in the range of 55%   to 60%. Wall motion was normal; there were no regional wall   motion abnormalities. Left ventricular diastolic function   parameters were normal.  MRI as above CT as in noted Awaiting Carotid and TEE  Antimicrobials:   none    Subjective: Awake alert in no distress eating drinking Main deficit is her left lower extremity foot drop She has not had any experience like this previously  Objective: Vitals:   06/28/17 0430 06/28/17 0500 06/28/17 0630 06/28/17 0954  BP: (!) 105/58  (!) 105/50 116/83  Pulse: 74  75 80  Resp: Temp: 99.2 F (37.3 C)  98.6 F (37 C) 98.6 F (37 C)  TempSrc: Oral  Oral Oral   SpO2: 100%  100% 100%  Weight:  98.8 kg (217 lb 13 oz)    Height:   (1.676 m)      Intake/Output Summary (Last 24 hours) at 06/28/17 1310 Last data filed at 06/28/17 0952  Gross per 24 hour  Intake                0 ml  Output              700 ml  Net             -700 ml   Filed Weights   06/28/17 0500  Weight: 98.8 kg (217 lb 13 oz)    Examination: Awake alert oriented no distress Body mass index is 35.16 kg/m. Mallampati 3 No JVD S1-S2 no murmur Smile symmetric External ocular movements intact Power 5/5 laterally in upper extremities in biceps triceps reflexes in upper extremities are 2/3 Plantarflexion is absent in the left lower extremity and patient is only barely able to dorsiflex in the left lower extremity Knee extension is limited in left lower extremity however is normal within the right sensory seems intact   Data Reviewed: I have personally reviewed following labs and imaging studies  CBC:  Recent Labs Lab 06/27/17 1455 06/27/17 1508  WBC 2.6*  --   NEUTROABS 1.5*  --   HGB 11.4* 13.6  HCT 34.8* 40.0  MCV 89.2  --   PLT 227  --    Basic Metabolic Panel:  Recent Labs Lab 06/27/17 1455 06/27/17 1508  NA 141 142  K 3.9 3.8  CL 103 102  CO2 29  --   GLUCOSE 93 87  BUN 10 8  CREATININE 1.07* 1.10*  CALCIUM 9.5  --    GFR: Estimated Creatinine Clearance: 70.9 mL/min (A) (by C-G formula based on SCr of 1.1 mg/dL (H)). Liver Function Tests:  Recent Labs Lab 06/27/17 1455  AST 28  ALT 23  ALKPHOS 103  BILITOT 0.7  PROT 7.2  ALBUMIN 4.3   No results for input(s): LIPASE, AMYLASE in the last 168 hours. No results for input(s): AMMONIA in the last 168 hours. Coagulation Profile:  Recent Labs Lab 06/27/17 1455  INR 1.01   Cardiac Enzymes: No results for input(s): CKTOTAL, CKMB, CKMBINDEX, TROPONINI in the last 168 hours. BNP (last 3 results) No results for input(s): PROBNP in the last 8760 hours. HbA1C:  Recent Labs   06/28/17 0932  HGBA1C 5.0   CBG: No results for input(s): GLUCAP in the last 168 hours. Lipid Profile:  Recent Labs  06/28/17 0932  CHOL 171  HDL 67  LDLCALC 96  TRIG 41  CHOLHDL 2.6   Thyroid Function Tests: No results for input(s): TSH, T4TOTAL, FREET4, T3FREE, THYROIDAB in the last 72 hours. Anemia Panel: No results for input(s): VITAMINB12, FOLATE, FERRITIN, TIBC, IRON, RETICCTPCT in the last 72 hours. Urine analysis:    Component Value Date/Time   COLORURINE YELLOW 06/28/2017 1002   APPEARANCEUR CLEAR 06/28/2017 1002   LABSPEC 1.016 06/28/2017 1002   PHURINE 6.0 06/28/2017 1002   GLUCOSEU NEGATIVE 06/28/2017 1002   HGBUR NEGATIVE 06/28/2017 1002   BILIRUBINUR NEGATIVE 06/28/2017 1002   KETONESUR NEGATIVE 06/28/2017 1002   PROTEINUR NEGATIVE 06/28/2017 1002   NITRITE NEGATIVE 06/28/2017 1002   LEUKOCYTESUR TRACE (A) 06/28/2017 1002     Radiology Studies: Reviewed images personally in health database    Scheduled Meds: . buPROPion  300 mg Oral Daily  . escitalopram  20 mg Oral Daily  . folic acid  1 mg Oral Daily  . heparin  5,000 Units Subcutaneous Q8H  . hydroxychloroquine  400 mg Oral Daily   Continuous Infusions:   LOS: 1 day    Time spent: 51    Pleas Koch, MD [facility] ([facility]   If 7PM-7AM, please contact night-coverage [email or web address] Password [email or web address] 06/28/2017, 1:10 PM

## 2017-06-28 NOTE — Progress Notes (Signed)
[employer] paged for left foot device for injury from fall at home.   Hav, RN

## 2017-06-28 NOTE — Progress Notes (Signed)
VASCULAR LAB PRELIMINARY  PRELIMINARY  PRELIMINARY  PRELIMINARY  Carotid duplex completed.    Preliminary report:  1-39% ICA plaquing. Vertebral artery flow is antegrade.   Olga Bourbeau, RVT 06/28/2017, 8:52 AM

## 2017-06-29 ENCOUNTER — Inpatient Hospital Stay (HOSPITAL_COMMUNITY): Payer: 59

## 2017-06-29 DIAGNOSIS — D72819 Decreased white blood cell count, unspecified: Secondary | ICD-10-CM

## 2017-06-29 DIAGNOSIS — D869 Sarcoidosis, unspecified: Secondary | ICD-10-CM

## 2017-06-29 DIAGNOSIS — R768 Other specified abnormal immunological findings in serum: Secondary | ICD-10-CM

## 2017-06-29 LAB — VAS US CAROTID
LEFT ECA DIAS: -17 cm/s
LEFT VERTEBRAL DIAS: -39 cm/s
Left CCA dist dias: -32 cm/s
Left CCA dist sys: -106 cm/s
Left CCA prox dias: -31 cm/s
Left CCA prox sys: -117 cm/s
Left ICA dist dias: -29 cm/s
Left ICA dist sys: -84 cm/s
Left ICA prox dias: -35 cm/s
Left ICA prox sys: -90 cm/s
RIGHT ECA DIAS: -30 cm/s
RIGHT VERTEBRAL DIAS: -21 cm/s
Right CCA prox dias: -34 cm/s
Right CCA prox sys: -124 cm/s
Right cca dist sys: -46 cm/s

## 2017-06-29 LAB — ENA+DNA/DS+ANTICH+CENTRO+JO...
Anti JO-1: <0.2 AI (ref 0.0–0.9)
Centromere Ab Screen: <0.2 AI (ref 0.0–0.9)
Chromatin Ab SerPl-aCnc: 0.2 AI (ref 0.0–0.9)
ENA SM Ab Ser-aCnc: <0.2 AI (ref 0.0–0.9)
Ribonucleic Protein: <0.2 AI (ref 0.0–0.9)
SSA (Ro) (ENA) Antibody, IgG: <0.2 AI (ref 0.0–0.9)
SSB (La) (ENA) Antibody, IgG: <0.2 AI (ref 0.0–0.9)
Scleroderma (Scl-70) (ENA) Antibody, IgG: 3 AI — ABNORMAL HIGH (ref 0.0–0.9)
ds DNA Ab: 2 IU/mL (ref 0–9)

## 2017-06-29 LAB — ANGIOTENSIN CONVERTING ENZYME: Angiotensin-Converting Enzyme: 59 U/L (ref 14–82)

## 2017-06-29 LAB — ANA W/REFLEX IF POSITIVE: Anti Nuclear Antibody(ANA): POSITIVE — AB

## 2017-06-29 MED ORDER — ASPIRIN 325 MG PO TABS: 325.0000 mg | ORAL_TABLET | Freq: Every day | ORAL | 0 refills | Status: AC

## 2017-06-29 MED ORDER — NONFORMULARY OR COMPOUNDED ITEM: 0 refills | Status: DC

## 2017-06-29 MED ORDER — ATORVASTATIN CALCIUM 80 MG PO TABS: 80.0000 mg | ORAL_TABLET | Freq: Every day | ORAL | 0 refills | Status: AC

## 2017-06-29 MED ORDER — GADOBENATE DIMEGLUMINE 529 MG/ML IV SOLN
20.0000 mL | Freq: Once | INTRAVENOUS | Status: AC
  Administered 2017-06-29: 20 mL via INTRAVENOUS

## 2017-06-29 NOTE — Care Management Note (Signed)
Case Management Note  Patient Details  Name: [name] MRN: [id number] Date of Birth: 1966/01/25  Subjective/Objective:                    Action/Plan: Pt discharging home with self care. Pt has prescriptions for outpatient therapy. CM provided her with facilities that have OT and PT with their contact information.  Patient states she has transportation home. She has a PCP and insurance.   Expected Discharge Date:  06/29/17               Expected Discharge Plan:  OP Rehab  In-House Referral:     Discharge planning Services  CM Consult  Post Acute Care Choice:    Choice offered to:     DME Arranged:    DME Agency:     HH Arranged:    HH Agency:     Status of Service:  Completed, signed off  If discussed at [employer] of Stay Meetings, dates discussed:    Additional Comments:  Kermit Balo, RN 06/29/2017, 4:11 PM

## 2017-06-29 NOTE — Progress Notes (Signed)
STROKE TEAM PROGRESS NOTE   HISTORY OF PRESENT ILLNESS (per record) [name] is an 51 y.o. female with a history of sarcoidosis, depression, anxiety and arthritis who presented with weakness of sudden onset involving left lower extremity, mostly distally, for 3 days. She has not been able to move her ankle and also unable to move her toes. She has not experienced numbness involving left lower extremity. She's had no symptoms involving left upper extremity. There is been no change in speech and no facial droop. She has no history of stroke or TIA. Imaging studies of the brain are pending.  LSN: 06/24/2017 tPA Given: No: beyond time window for treatment consideration. mRankin:  Patient was not administered IV t-PA secondary to arriving outside of the tPA treatment window. She was admitted to General Neurology for further evaluation and treatment.   SUBJECTIVE (INTERVAL HISTORY) Her sister is at the bedside.  The pt is awake, alert, and oriented.   .  L-sided weakness persists.  MRI brain with contrast shows no enhancing lesions. MRI scan of the cervical and thoracic spine also did not show definite demyelinating lesions. Retrospective review of MRA of the brain from yesterday shows a focal signal loss of right A2/A3 junction suggestive of possible embolus with subtotal resolution. OBJECTIVE Temp:  [98 F (36.7 C)-98.6 F (37 C)] 98.4 F (36.9 C) (09/19 0900) Pulse Rate:  [76-83] 77 (09/19 0900) Cardiac Rhythm: Normal sinus rhythm;Bundle branch block (09/19 0700) Resp:  [18] 18 (09/19 0900) BP: (104-125)/(49-74) 125/74 (09/19 0900) SpO2:  [97 %-100 %] 98 % (09/19 0900)  CBC:   Recent Labs Lab 06/27/17 1455 06/27/17 1508  WBC 2.6*  --   NEUTROABS 1.5*  --   HGB 11.4* 13.6  HCT 34.8* 40.0  MCV 89.2  --   PLT 227  --     Basic Metabolic Panel:   Recent Labs Lab 06/27/17 1455 06/27/17 1508  NA 141 142  K 3.9 3.8  CL 103 102  CO2 29  --   GLUCOSE 93 87  BUN 10 8   CREATININE 1.07* 1.10*  CALCIUM 9.5  --     Lipid Panel:     Component Value Date/Time   CHOL 171 06/28/2017 0932   TRIG 41 06/28/2017 0932   HDL 67 06/28/2017 0932   CHOLHDL 2.6 06/28/2017 0932   VLDL 8 06/28/2017 0932   LDLCALC 96 06/28/2017 0932   HgbA1c:  Lab Results  Component Value Date   HGBA1C 5.0 06/28/2017   Urine Drug Screen:     Component Value Date/Time   LABOPIA NONE DETECTED 06/28/2017 1002   COCAINSCRNUR NONE DETECTED 06/28/2017 1002   LABBENZ NONE DETECTED 06/28/2017 1002   AMPHETMU NONE DETECTED 06/28/2017 1002   THCU NONE DETECTED 06/28/2017 1002   LABBARB NONE DETECTED 06/28/2017 1002    Alcohol Level     Component Value Date/Time   ETH <5 06/27/2017 1455    IMAGING  Dg Ankle Complete Left 06/27/2017 IMPRESSION: Avulsion fracture involving the base of the 5th metatarsal.  Mr [name] Contrast Mr Brain Wo Contrast (neuro Protocol) 06/27/2017 IMPRESSION: 1. Multifocal acute ischemia within the right hemisphere at the watershed junctions between the right ACA and MCA and right MCA and PCA. 2. No hemorrhage or mass effect. 3. Normal intracranial MRA.  TTE 06/28/2017 Study Conclusions - Left ventricle: The cavity size was normal. Systolic function was normal. The estimated ejection fraction was in the range of 55% to 60%. Wall motion was  normal; there were no regional wall motion abnormalities. Left ventricular diastolic function parameters were normal.  Carotid US 06/28/2017 1-39% ICA plaquing. Vertebral artery flow is antegrade  TEE 06/28/2017 pending   PHYSICAL EXAM Pleasant middle aged lady not in distress. . Afebrile. Head is nontraumatic. Neck is supple without bruit.    Cardiac exam no murmur or gallop. Lungs are clear to auscultation. Distal pulses are well felt. Neurological Exam :  Awake alert oriented x 3 normal speech and language.Intact attention registration and recall. Mild left lower face asymmetry. Tongue midline. Mild  left lower extremity drift. Mild diminished fine finger movements on left. Orbits right over left upper extremity. Mild left grip weak.. Left lower extremity 4/5 strength proximally and left foot drop with 2/5 ankle dorsiflexors. Increased tone in the left lower extremity with sustained left ankle clonus. Normal sensation . Normal coordination. Deep tendon reflexes are brisk throughout but left greater than right. Gait not tested  ASSESSMENT/PLAN [name] is a 51 y.o. female with history ofJudy L Kidd is an 51 y.o. female with a history of depression, anxiety and arthritis who presented with weakness of sudden onset involving left lower extremity, mostly distally, for 3 days. She did not receive IV t-PA due to presenting outside of the tPA treatment window.     Stroke: Multifocal R hemisphere acute infarsts at watershed junctions between ACA and MCA, and MCA and PCA, likely embolic, of undetermined etiology.  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  MRA head: no LVO or high-grade stenosis  Carotid Doppler: 1-39% ICA plaquing. Vertebral artery flow is antegrade  2D Echo: EF 55-60%. No source of embolus   LDL 96  HgbA1c 5  Heparin 8,469 units every 8 hours, for VTE prophylaxis Diet Heart Room service appropriate? Yes; Fluid consistency: Thin  No antithrombotic prior to admission, now on   aspirin 325 mg  Patient counseled to be compliant with her antithrombotic medications  Ongoing aggressive stroke risk factor management  Therapy recommendations: pending  Disposition: pending  Hyperlipidemia  Home meds: none  LDL 96, goal < 70  Add atorvastatin 20 mg PO daily  Continue statin at discharge  Other Stroke Risk Factors  ETOH use, advised to drink no more than 1 drink(s) a day  Obesity,  Body mass index is 35.16 kg/m., recommend weight loss, diet and exercise as appropriate   Family hx stroke (mother)  OSA  Other Active Problems  None  Hospital day # 2   Brain MRI scan shows no enhancing lesions and MRA shows focal area of signal loss suggestive of possible embolus with partial recanalization. ANA is positive and anti-scleroderma antibodies are elevated but patient denies any history suggestive of lupus or scleroderma The patient may be discharged home with outpatient therapies on aspirin and Lipitor , referral to rheumatologist for consult, outpatient Transesophageal echocardiogram and loop recorder . Greater than 50% time during this 25 minute visit was spent on counseling and coordination of care about her strokes and plan for evaluation, treatment and prevention discussion. Discussed with Dr. Andrey Farmer, MD Medical Director [facility] Stroke Kidd Pager: [phone] 06/29/2017 3:05 PM   To contact Stroke Continuity provider, please refer to [email or web address]. After hours, contact General Neurology

## 2017-06-29 NOTE — Progress Notes (Signed)
Pt was NPO for TEE. TEE delayed till Friday. Diet initiated and patient to MRI for test. Medications delayed due to NPO status. No acute distress. Pleasant and cooperative patient.

## 2017-06-29 NOTE — Evaluation (Signed)
Physical Therapy Evaluation Patient Details Name: [name] MRN: [id number] DOB: [birth year] Today's Date: 06/29/2017   History of Present Illness  Pt is a 51 y/o female admitted secondary to reports of L LE weakness and L foot drop/drag. MRI revealed multifocal R hemisphere acute infarcts at the watershed junctions between ACA and MCA, and MCA and PCA. PMH including but not limited to HTN and sarcoidosis.  Clinical Impression  Pt presented supine in bed with HOB elevated, awake and willing to participate in therapy session. Prior to admission, pt reported that she was independent with all functional mobility and ADLs. Pt lives with her sister who is retired and can provide 24/7 supervision/assistance as needed. Pt reported that she continues to work full-time. Pt limited with functional mobility secondary to L LE weakness and L foot drop. Pt ambulated in room with min guard for safety and use of RW. Pt would continue to benefit from skilled physical therapy services at this time while admitted and after d/c to address the below listed limitations in order to improve overall safety and independence with functional mobility.     Follow Up Recommendations Outpatient PT;Supervision/Assistance - 24 hour;Other (comment) (Neuro Outpatient)    Equipment Recommendations  None recommended by PT;Other (comment) (pt reports that she has all necessary DME at home)    Recommendations for Other Services       Precautions / Restrictions Precautions Precautions: Fall Restrictions Weight Bearing Restrictions: No Other Position/Activity Restrictions: CAM boot for L foot      Mobility  Bed Mobility Overal bed mobility: Modified Independent             General bed mobility comments: increased time  Transfers Overall transfer level: Needs assistance Equipment used: None;Rolling walker (2 wheeled) Transfers: Sit to/from Stand Sit to Stand: Min guard;Mod assist         General transfer  comment: min guard from EOB without AD; mod A from low toilet with RW  Ambulation/Gait Ambulation/Gait assistance: Min guard Ambulation Distance (Feet): 40 Feet Assistive device: Rolling walker (2 wheeled) Gait Pattern/deviations: Step-to pattern;Step-through pattern;Decreased step length - right;Decreased step length - left;Decreased stance time - left;Decreased stride length;Decreased dorsiflexion - left;Decreased weight shift to left Gait velocity: decreased Gait velocity interpretation: Below normal speed for age/gender General Gait Details: pt ambulated with CAM boot donned to L LE with great difficulty lifting her LE to clear foot during swing phase. Pt with preference to keep L LE in IR and adducted. Pt with modest instability but no overt LOB or need for physical assistance, close min guard for safety  Stairs            Wheelchair Mobility    Modified Rankin (Stroke Patients Only) Modified Rankin (Stroke Patients Only) Pre-Morbid Rankin Score: No symptoms Modified Rankin: Moderately severe disability     Balance Overall balance assessment: Needs assistance Sitting-balance support: Feet supported Sitting balance-Leahy Scale: Good     Standing balance support: During functional activity;Bilateral upper extremity supported Standing balance-Leahy Scale: Poor                               Pertinent Vitals/Pain Pain Assessment: No/denies pain    Home Living Family/patient expects to be discharged to:: Private residence Living Arrangements: Other relatives Available Help at Discharge: Family;Available 24 hours/day Type of Home: House Home Access: Level entry     Home Layout: One level Home Equipment: Shower seat - built in;Walker -  2 wheels;Bedside commode      Prior Function Level of Independence: Independent               Hand Dominance   Dominant Hand: Right    Extremity/Trunk Assessment   Upper Extremity Assessment Upper Extremity  Assessment: Overall WFL for tasks assessed    Lower Extremity Assessment Lower Extremity Assessment: LLE deficits/detail LLE Deficits / Details: MMT revealed 4/5 for hip flexion, 4/5 for knee extension, 3+/5 for knee flexion, 0/5 for ankle DF. Sensation grossly intact as compared to R LE.       Communication   Communication: No difficulties  Cognition Arousal/Alertness: Awake/alert Behavior During Therapy: WFL for tasks assessed/performed Overall Cognitive Status: Within Functional Limits for tasks assessed                                 General Comments: cognition not formally assessed but [facility] for general conversation      General Comments      Exercises     Assessment/Plan    PT Assessment Patient needs continued PT services  PT Problem List Decreased strength;Decreased activity tolerance;Decreased mobility;Decreased balance;Decreased coordination;Decreased knowledge of use of DME;Decreased safety awareness;Decreased knowledge of precautions       PT Treatment Interventions DME instruction;Gait training;Stair training;Functional mobility training;Therapeutic activities;Therapeutic exercise;Balance training;Neuromuscular re-education;Patient/family education    PT Goals (Current goals can be found in the Care Plan section)  Acute Rehab PT Goals Patient Stated Goal: return home PT Goal Formulation: With patient Time For Goal Achievement: 07/13/17 Potential to Achieve Goals: Good    Frequency Min 4X/week   Barriers to discharge        Co-evaluation               AM-PAC PT "6 Clicks" Daily Activity  Outcome Measure Difficulty turning over in bed (including adjusting bedclothes, sheets and blankets)?: A Little Difficulty moving from lying on back to sitting on the side of the bed? : A Little Difficulty sitting down on and standing up from a chair with arms (e.g., wheelchair, bedside commode, etc,.)?: Unable Help needed moving to and from a bed  to chair (including a wheelchair)?: A Little Help needed walking in hospital room?: A Little Help needed climbing 3-5 steps with a railing? : A Lot 6 Click Score: 15    End of Session Equipment Utilized During Treatment: Gait belt Activity Tolerance: Patient tolerated treatment well Patient left: in bed;with call bell/phone within reach;with bed alarm set;with family/visitor present Nurse Communication: Mobility status PT Visit Diagnosis: Unsteadiness on feet (R26.81);Other abnormalities of gait and mobility (R26.89)    Time: 8295-6213 PT Time Calculation (min) (ACUTE ONLY): 21 min   Charges:   PT Evaluation $PT Eval Moderate Complexity: 1 Mod     PT G Codes:        [place], PT, DPT [phone]   Alessandra Bevels Kora Groom 06/29/2017, 9:47 AM

## 2017-06-29 NOTE — Progress Notes (Signed)
Pt dc/d home. Instruction given, patient verbalized understanding. No meds give in hospital, patient had been NPO, will take medications when she gets home. No acute distress. Home by car with family.

## 2017-06-29 NOTE — Discharge Summary (Signed)
Physician Discharge Summary  [name] ZOX:[id number] DOB: [birth year] DOA: 06/27/2017  PCP: Shirlean Mylar, MD  Admit date: 06/27/2017 Discharge date: 06/29/2017  Admitted From: home  Disposition:  home  Recommendations for Outpatient Follow-up:  1. Follow up with Dr. Pearlean Brownie in 6 weeks regarding stroke 2. Rheumatology, Zenovia Jordan, at already scheduled appointment next week.  PLease review ANA and scleroderma ab.  Could this stroke be related to autoimmune hypercoagulability?  Consideration of full dose anticoagulation if these tests are considered clinically significant 3. [facility] to arrange outpatient loop recorder and TEE 4.  PCP for CBC in 1-2 weeks  Home Health:  Outpatient PT/OT  Equipment/Devices:  CAM boot provided to patient for her left foot  Discharge Condition:  Stable, improved CODE STATUS:  Full code  Diet recommendation:  Healthy heart   Brief/Interim Summary:  51 yo F with sarcoidosis followed by Dr. Zenovia Jordan, on plaquenil and MTX, OSA on CPAp, and mild asthma who presented with sudden foot drop and found to have a CVA on MRI.  Neurology was consulted and felt her symptoms may have been related to MS, given her age and presence of clonus on exam.  She underwent MRI brain and spine with contrast, but no other obvious lesions were seen.  Upon review again of the MRA, Dr. Pearlean Brownie, Neurology, felt that she may have a small clot in the same distribution of her stroke, suggesting an embolic stroke.  She will follow up as an outpatient for a loop recorder and TEE.  TTE did not reveal signs of PFO or valvular dysfunction.  Additionally, her ANA was positive and her scleroderma IgG was mildly elevated.  She has no signs of scleroderma on exam or by history, however.  She will follow up with Rheumatology to discuss the clinical significance of these findings.  She did not have improvement in her symptoms, but was able to get around well with a CAM boot and PT/Ot recommended outpatient  therapy.    Discharge Diagnoses:  Active Problems:   Weakness of left foot  Multifocal acute ischemia within the right hemisphere at the watershed junctions between the right ACA and MCA and right MCA and PCA.  Area of occlusion in same distribution on MRA without evidence of significant atherosclerosis or stenosis.   -  Loop recorder and TEE as outpatient -  Rheumatology follow up regarding autoimmune panel -  Started aspirin 325mg  daily and high dose statin -  Outpatient PT/OT with cam boot  Sarcoid, well controlled, continued home medications.  Hypertension, blood pressure well controlled with diet.  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   Increase activity slowly    Complete by:  As directed        Medication List    TAKE these medications   aspirin 325 MG tablet Take 1 tablet (325 mg total) by  mouth daily.   atorvastatin 80 MG tablet Commonly known as:  LIPITOR Take 1 tablet (80 mg total) by mouth daily at 6 PM.   buPROPion 300 MG 24 hr tablet Commonly known as:  WELLBUTRIN XL Take 300 mg by mouth daily.   escitalopram 20 MG tablet Commonly known as:  LEXAPRO Take 20 mg by mouth daily.   folic acid 400 MCG tablet Commonly known as:  FOLVITE Take 400 mcg by mouth daily.   methotrexate 2.5 MG tablet Commonly known as:  RHEUMATREX Take 7.5-10 mg by mouth 2 (two) times a week. Take 4 tablets  Tuesday and 3 tablets on Wednesday......Marland KitchenCaution:Chemotherapy. Protect from light.   NONFORMULARY OR COMPOUNDED ITEM Outpatient physical therapy evaluation and treatment. Indication:  Acute ischemic stroke with residual left lower extremity weakness.   NONFORMULARY OR COMPOUNDED ITEM Outpatient occupational therapy evaluation and treatment.  Indication:  Acute ischemic stroke with residual left lower extremity weakness.   PLAQUENIL 200 MG tablet Generic drug:  hydroxychloroquine Take 400 mg by mouth daily.      Follow-up Information    Shirlean Mylar, MD. Schedule an appointment as soon as possible for a visit.   Specialty:  Family Medicine Contact information: [address] [place] [address] [phone]        Micki Riley, MD. Schedule an appointment as soon as possible for a visit in 6 week(s).   Specialties:  Neurology, Radiology Contact information: [address] [place] [address] [phone]        Zenovia Jordan, MD. Go on 07/06/2017.   Specialty:  Rheumatology Contact information: [address] [place] [address] [phone]        [facility] Follow up.   Specialty:  Cardiology Why:  call if you have not heard about loop recorder and ECHO in 3 days. Contact information: [address] [place] [address] [phone]         Allergies  Allergen Reactions  . Ibuprofen Itching    Consultations: Neurology   Procedures/Studies: Dg Ankle Complete Left  Result Date: 06/27/2017 CLINICAL DATA:  Lateral ankle pain, status post multiple falls EXAM: LEFT ANKLE COMPLETE - 3+ VIEW COMPARISON:  None. FINDINGS: Suspected avulsion fracture involving the base of the 5th metatarsal. Distal tibia and fibula appear intact. The ankle mortise is intact. Visualized soft tissues are within normal limits. IMPRESSION: Avulsion fracture involving the base of the  5th metatarsal. Electronically Signed   By: Charline Bills M.D.   On: 06/27/2017 11:33   Mr Maxine Glenn Head Wo Contrast  Result Date: 06/27/2017 CLINICAL DATA:  Sudden onset left foot drop EXAM: MRI HEAD WITHOUT CONTRAST MRA HEAD WITHOUT CONTRAST TECHNIQUE: Multiplanar, multiecho pulse sequences of the brain and surrounding structures were obtained without intravenous contrast. Angiographic images of the head were obtained using MRA technique without contrast. COMPARISON:  Head CT 06/11/2009 FINDINGS: MRI HEAD FINDINGS Brain: The midline structures are normal. There is multifocal diffusion restriction within the right hemisphere, at the watershed stones between the right anterior and middle cerebral arteries and between the right middle and posterior cerebral arteries. No associated hemorrhage or mass effect. There is periventricular leukoaraiosis, most commonly seen in the setting of chronic hypertensive microangiopathy. No mass lesion. No chronic microhemorrhage or cerebral amyloid angiopathy. No hydrocephalus, age advanced atrophy or lobar predominant volume loss. No dural abnormality or extra-axial collection. Skull and upper cervical spine: The visualized skull base, calvarium, upper  cervical spine and extracranial soft tissues are normal. Sinuses/Orbits: No fluid levels or advanced mucosal thickening. No mastoid effusion. Normal orbits. MRA HEAD FINDINGS Intracranial internal carotid arteries: Normal. Anterior cerebral arteries: Normal. Middle cerebral arteries: Normal. Posterior communicating arteries: Absent bilaterally. Posterior cerebral arteries: Normal. Basilar artery: Normal. Vertebral arteries: Left dominant. Normal. Superior cerebellar arteries: Normal. Anterior inferior cerebellar arteries: Normal. Posterior inferior cerebellar arteries: Normal. IMPRESSION: 1. Multifocal acute ischemia within the right hemisphere at the watershed junctions between the right ACA and MCA and right MCA and PCA. 2. No  hemorrhage or mass effect. 3. Normal intracranial MRA. Electronically Signed   By: Deatra Robinson M.D.   On: 06/27/2017 23:44   Mr Brain Wo Contrast (neuro Protocol)  Result Date: 06/27/2017 CLINICAL DATA:  Sudden onset left foot drop EXAM: MRI HEAD WITHOUT CONTRAST MRA HEAD WITHOUT CONTRAST TECHNIQUE: Multiplanar, multiecho pulse sequences of the brain and surrounding structures were obtained without intravenous contrast. Angiographic images of the head were obtained using MRA technique without contrast. COMPARISON:  Head CT 06/11/2009 FINDINGS: MRI HEAD FINDINGS Brain: The midline structures are normal. There is multifocal diffusion restriction within the right hemisphere, at the watershed stones between the right anterior and middle cerebral arteries and between the right middle and posterior cerebral arteries. No associated hemorrhage or mass effect. There is periventricular leukoaraiosis, most commonly seen in the setting of chronic hypertensive microangiopathy. No mass lesion. No chronic microhemorrhage or cerebral amyloid angiopathy. No hydrocephalus, age advanced atrophy or lobar predominant volume loss. No dural abnormality or extra-axial collection. Skull and upper cervical spine: The visualized skull base, calvarium, upper cervical spine and extracranial soft tissues are normal. Sinuses/Orbits: No fluid levels or advanced mucosal thickening. No mastoid effusion. Normal orbits. MRA HEAD FINDINGS Intracranial internal carotid arteries: Normal. Anterior cerebral arteries: Normal. Middle cerebral arteries: Normal. Posterior communicating arteries: Absent bilaterally. Posterior cerebral arteries: Normal. Basilar artery: Normal. Vertebral arteries: Left dominant. Normal. Superior cerebellar arteries: Normal. Anterior inferior cerebellar arteries: Normal. Posterior inferior cerebellar arteries: Normal. IMPRESSION: 1. Multifocal acute ischemia within the right hemisphere at the watershed junctions between the  right ACA and MCA and right MCA and PCA. 2. No hemorrhage or mass effect. 3. Normal intracranial MRA. Electronically Signed   By: Deatra Robinson M.D.   On: 06/27/2017 23:44   Mr Brain W Contrast  Result Date: 06/29/2017 CLINICAL DATA:  Sudden onset weakness of the left lower extremity. History of sarcoid. EXAM: MRI HEAD WITH CONTRAST TECHNIQUE: Multiplanar, multiecho pulse sequences of the brain and surrounding structures were obtained with intravenous contrast. CONTRAST:  20mL MULTIHANCE GADOBENATE DIMEGLUMINE 529 MG/ML IV SOLN COMPARISON:  Noncontrast brain MRI and MRA from yesterday. FINDINGS: Brain: There is no abnormal intracranial enhancement to suggest active demyelination or intracranial sarcoid. No subacute enhancing infarct. In retrospect, there was right A2/3 segment flow gap/occlusion on MRA yesterday, correlating with ACA and watershed infarcts. Vascular: Patent dural venous sinuses. MRI performed yesterday. Small incidental developmental venous anomaly in the upper vermis. Skull and upper cervical spine: Negative for marrow lesion. Sinuses/Orbits: Negative. IMPRESSION: 1. No abnormal intracranial enhancement to suggest active demyelination or intracranial sarcoid. 2. In retrospect there was right A2- A3 junction occlusion/flow gap on MRA yesterday, correlating with downstream infarcts. Electronically Signed   By: Marnee Spring M.D.   On: 06/29/2017 14:46   Mr Cervical Spine W Wo Contrast  Result Date: 06/29/2017 CLINICAL DATA:  New neurologic event. Concern for demyelination. Patient has history of sarcoid. EXAM: MRI CERVICAL AND THORACIC WITHOUT AND  WITH CONTRAST TECHNIQUE: Multisequence MR imaging of the cervical and thoracic spine was performed prior to and following IV contrast administration . CONTRAST:  20mL MULTIHANCE GADOBENATE DIMEGLUMINE 529 MG/ML IV SOLN COMPARISON:  None. FINDINGS: MRI CERVICAL SPINE FINDINGS Alignment: Straightening without subluxation Vertebrae: No fracture,  evidence of discitis, or bone lesion. Cord: Normal signal and morphology. No abnormal intrathecal enhancement. Posterior Fossa, vertebral arteries, paraspinal tissues: There is a nearly 3 cm left thyroid nodule containing T1 hyperintense components, presumably proteinaceous material. Disc levels: C2-3: Unremarkable. C3-4: Unremarkable. C4-5: Small right foraminal protrusion or uncovertebral spur with mild right foraminal narrowing. C5-6: Degenerative disc narrowing and bulging with small uncovertebral spurs, greater towards the right. Mild right foraminal narrowing. Partial ventral subarachnoid space effacement. C6-7: Mild disc narrowing and desiccation with a right paracentral protrusion contacting the ventral cord without compression. Patent foramina. C7-T1:Unremarkable. MRI THORACIC SPINE FINDINGS Axial postcontrast imaging was inadvertently omitted. 2 sagittal postcontrast acquisitions were acquired and cover the cord and vertebrae. Alignment:  Normal Vertebrae: No fracture, evidence of discitis, or bone lesion. Cord:  Normal signal and morphology. Paraspinal and other soft tissues: Incidental left renal cyst. No noted mass or inflammation. Disc levels: There is spondylosis and mild disc narrowing/ bulging at the lower thoracic levels. Mild to moderate facet hypertrophy also noted, most notable at T9-10 where there is bilateral foraminal narrowing. The left foramina is also notably narrowed at T11-12 due to facet spurring. Disc bulge nearly contacts the ventral cord at T10-11 and T11-12, without compression. IMPRESSION: Cervical spine: 1. No evidence of demyelination. 2. Noncompressive degenerative changes are described above. 3. 3 cm left thyroid mass, recommend nonemergent sonography. Thoracic spine: 1. No evidence of demyelination. 2. Lower thoracic spondylosis and disc narrowing with mild facet spurring. T9-10 bilateral and T11-12 left foraminal narrowing. Electronically Signed   By: Marnee Spring M.D.    On: 06/29/2017 15:06   Mr Thoracic Spine W Wo Contrast  Result Date: 06/29/2017 CLINICAL DATA:  New neurologic event. Concern for demyelination. Patient has history of sarcoid. EXAM: MRI CERVICAL AND THORACIC WITHOUT AND WITH CONTRAST TECHNIQUE: Multisequence MR imaging of the cervical and thoracic spine was performed prior to and following IV contrast administration . CONTRAST:  20mL MULTIHANCE GADOBENATE DIMEGLUMINE 529 MG/ML IV SOLN COMPARISON:  None. FINDINGS: MRI CERVICAL SPINE FINDINGS Alignment: Straightening without subluxation Vertebrae: No fracture, evidence of discitis, or bone lesion. Cord: Normal signal and morphology. No abnormal intrathecal enhancement. Posterior Fossa, vertebral arteries, paraspinal tissues: There is a nearly 3 cm left thyroid nodule containing T1 hyperintense components, presumably proteinaceous material. Disc levels: C2-3: Unremarkable. C3-4: Unremarkable. C4-5: Small right foraminal protrusion or uncovertebral spur with mild right foraminal narrowing. C5-6: Degenerative disc narrowing and bulging with small uncovertebral spurs, greater towards the right. Mild right foraminal narrowing. Partial ventral subarachnoid space effacement. C6-7: Mild disc narrowing and desiccation with a right paracentral protrusion contacting the ventral cord without compression. Patent foramina. C7-T1:Unremarkable. MRI THORACIC SPINE FINDINGS Axial postcontrast imaging was inadvertently omitted. 2 sagittal postcontrast acquisitions were acquired and cover the cord and vertebrae. Alignment:  Normal Vertebrae: No fracture, evidence of discitis, or bone lesion. Cord:  Normal signal and morphology. Paraspinal and other soft tissues: Incidental left renal cyst. No noted mass or inflammation. Disc levels: There is spondylosis and mild disc narrowing/ bulging at the lower thoracic levels. Mild to moderate facet hypertrophy also noted, most notable at T9-10 where there is bilateral foraminal narrowing. The  left foramina is also notably narrowed at T11-12 due to  facet spurring. Disc bulge nearly contacts the ventral cord at T10-11 and T11-12, without compression. IMPRESSION: Cervical spine: 1. No evidence of demyelination. 2. Noncompressive degenerative changes are described above. 3. 3 cm left thyroid mass, recommend nonemergent sonography. Thoracic spine: 1. No evidence of demyelination. 2. Lower thoracic spondylosis and disc narrowing with mild facet spurring. T9-10 bilateral and T11-12 left foraminal narrowing. Electronically Signed   By: Marnee Spring M.D.   On: 06/29/2017 15:06    Subjective: No new weakness, numbness, tingling, slurred speech or confusion.  Left foot weakness has not improved.  Denies numbness in left extremity.   Discharge Exam: Vitals:   06/29/17 0622 06/29/17 0900  BP: (!) 106/49 125/74  Pulse: 76 77  Resp: 18 18  Temp: 98.4 F (36.9 C) 98.4 F (36.9 C)  SpO2: 97% 98%   Vitals:   06/28/17 2106 06/29/17 0153 06/29/17 0622 06/29/17 0900  BP: (!) 124/53 (!) 104/57 (!) 106/49 125/74  Pulse: 83 81 76 77  Resp: Temp: 98 F (36.7 C) 98.6 F (37 C) 98.4 F (36.9 C) 98.4 F (36.9 C)  TempSrc: Oral Oral Oral Oral  SpO2: 100% 97% 97% 98%  Weight:      Height:        General: Pt is alert, awake, not in acute distress Cardiovascular: RRR, S1/S2 +, no rubs, no gallops Respiratory: CTA bilaterally, no wheezing, no rhonchi Abdominal: Soft, NT, ND, bowel sounds + Extremities: no edema, no cyanosis Neuro:  1/5 strength in the left ankle and left foot/toes, otherwise strength 5/5 throughout.  CN II-XII grossly intact, no facial droop, slurred speech or confusion.  Sensation intact to light touch throughout.    The results of significant diagnostics from this hospitalization (including imaging, microbiology, ancillary and laboratory) are listed below for reference.     Microbiology: No results found for this or any previous visit (from the past 240  hour(s)).   Labs: BNP (last 3 results) No results for input(s): BNP in the last 8760 hours. Basic Metabolic Panel:  Recent Labs Lab 06/27/17 1455 06/27/17 1508  NA 141 142  K 3.9 3.8  CL 103 102  CO2 29  --   GLUCOSE 93 87  BUN 10 8  CREATININE 1.07* 1.10*  CALCIUM 9.5  --    Liver Function Tests:  Recent Labs Lab 06/27/17 1455  AST 28  ALT 23  ALKPHOS 103  BILITOT 0.7  PROT 7.2  ALBUMIN 4.3   No results for input(s): LIPASE, AMYLASE in the last 168 hours. No results for input(s): AMMONIA in the last 168 hours. CBC:  Recent Labs Lab 06/27/17 1455 06/27/17 1508  WBC 2.6*  --   NEUTROABS 1.5*  --   HGB 11.4* 13.6  HCT 34.8* 40.0  MCV 89.2  --   PLT 227  --    Cardiac Enzymes: No results for input(s): CKTOTAL, CKMB, CKMBINDEX, TROPONINI in the last 168 hours. BNP: Invalid input(s): POCBNP CBG: No results for input(s): GLUCAP in the last 168 hours. D-Dimer No results for input(s): DDIMER in the last 72 hours. Hgb A1c  Recent Labs  06/28/17 0932  HGBA1C 5.0   Lipid Profile  Recent Labs  06/28/17 0932  CHOL 171  HDL 67  LDLCALC 96  TRIG 41  CHOLHDL 2.6   Thyroid function studies No results for input(s): TSH, T4TOTAL, T3FREE, THYROIDAB in the last 72 hours.  Invalid input(s): FREET3 Anemia work up No results for input(s): VITAMINB12, FOLATE,  FERRITIN, TIBC, IRON, RETICCTPCT in the last 72 hours. Urinalysis    Component Value Date/Time   COLORURINE YELLOW 06/28/2017 1002   APPEARANCEUR CLEAR 06/28/2017 1002   LABSPEC 1.016 06/28/2017 1002   PHURINE 6.0 06/28/2017 1002   GLUCOSEU NEGATIVE 06/28/2017 1002   HGBUR NEGATIVE 06/28/2017 1002   BILIRUBINUR NEGATIVE 06/28/2017 1002   KETONESUR NEGATIVE 06/28/2017 1002   PROTEINUR NEGATIVE 06/28/2017 1002   NITRITE NEGATIVE 06/28/2017 1002   LEUKOCYTESUR TRACE (A) 06/28/2017 1002   Sepsis Labs Invalid input(s): PROCALCITONIN,  WBC,  LACTICIDVEN   Time coordinating discharge: Over 30  minutes  SIGNED:   Renae Fickle, MD  [facility] 06/29/2017, 6:52 PM Pager   If 7PM-7AM, please contact night-coverage [email or web address] Password [email or web address]

## 2017-07-01 LAB — ANTIPHOSPHOLIPID SYNDROME EVAL, BLD
Anticardiolipin IgA: <9 APL U/mL (ref 0–11)
Anticardiolipin IgG: <9 GPL U/mL (ref 0–14)
Anticardiolipin IgM: <9 MPL U/mL (ref 0–12)
DRVVT: 35.2 s (ref 0.0–47.0)
PTT Lupus Anticoagulant: 28.1 s (ref 0.0–51.9)
Phosphatydalserine, IgA: 2 APS IgA (ref 0–20)
Phosphatydalserine, IgG: 9 GPS IgG (ref 0–11)
Phosphatydalserine, IgM: 13 MPS IgM (ref 0–25)

## 2017-07-05 ENCOUNTER — Telehealth: Payer: Self-pay | Admitting: Nurse Practitioner

## 2017-07-05 ENCOUNTER — Other Ambulatory Visit: Payer: Self-pay | Admitting: Nurse Practitioner

## 2017-07-05 DIAGNOSIS — Z8673 Personal history of transient ischemic attack (TIA), and cerebral infarction without residual deficits: Secondary | ICD-10-CM | POA: Diagnosis not present

## 2017-07-05 DIAGNOSIS — Z5181 Encounter for therapeutic drug level monitoring: Secondary | ICD-10-CM | POA: Diagnosis not present

## 2017-07-05 NOTE — Telephone Encounter (Signed)
Pt referred for TEE/ILR implant for recent cryptogenic stroke. TEE/loop scheduled for Tuesday, 07/12/17. Pt to arrive at [facility] at [facility]. NPO after 9AM. Orders entered, instructions reviewed.  Gypsy Balsam, NP 07/05/2017 9:00 AM

## 2017-07-06 DIAGNOSIS — D869 Sarcoidosis, unspecified: Secondary | ICD-10-CM | POA: Diagnosis not present

## 2017-07-06 DIAGNOSIS — Z79899 Other long term (current) drug therapy: Secondary | ICD-10-CM | POA: Diagnosis not present

## 2017-07-06 DIAGNOSIS — R21 Rash and other nonspecific skin eruption: Secondary | ICD-10-CM | POA: Diagnosis not present

## 2017-07-12 ENCOUNTER — Encounter (HOSPITAL_COMMUNITY): Payer: Self-pay

## 2017-07-12 ENCOUNTER — Encounter (HOSPITAL_COMMUNITY)
Admission: RE | Disposition: A | Discharge status: Home / Self Care | Payer: Self-pay | Source: Ambulatory Visit | Attending: Cardiology

## 2017-07-12 ENCOUNTER — Ambulatory Visit (HOSPITAL_BASED_OUTPATIENT_CLINIC_OR_DEPARTMENT_OTHER): Payer: 59

## 2017-07-12 ENCOUNTER — Ambulatory Visit (HOSPITAL_COMMUNITY)
Admission: RE | Admit: 2017-07-12 | Discharge: 2017-07-12 | Disposition: A | Discharge status: Home / Self Care | Payer: 59 | Source: Ambulatory Visit | Attending: Cardiology | Admitting: Cardiology

## 2017-07-12 DIAGNOSIS — Z6835 Body mass index (BMI) 35.0-35.9, adult: Secondary | ICD-10-CM | POA: Diagnosis not present

## 2017-07-12 DIAGNOSIS — I6389 Other cerebral infarction: Secondary | ICD-10-CM | POA: Diagnosis not present

## 2017-07-12 DIAGNOSIS — F329 Major depressive disorder, single episode, unspecified: Secondary | ICD-10-CM | POA: Insufficient documentation

## 2017-07-12 DIAGNOSIS — Z7982 Long term (current) use of aspirin: Secondary | ICD-10-CM | POA: Insufficient documentation

## 2017-07-12 DIAGNOSIS — E669 Obesity, unspecified: Secondary | ICD-10-CM | POA: Insufficient documentation

## 2017-07-12 DIAGNOSIS — I639 Cerebral infarction, unspecified: Secondary | ICD-10-CM | POA: Diagnosis not present

## 2017-07-12 DIAGNOSIS — F419 Anxiety disorder, unspecified: Secondary | ICD-10-CM | POA: Diagnosis not present

## 2017-07-12 DIAGNOSIS — Z79899 Other long term (current) drug therapy: Secondary | ICD-10-CM | POA: Diagnosis not present

## 2017-07-12 DIAGNOSIS — Z87891 Personal history of nicotine dependence: Secondary | ICD-10-CM | POA: Insufficient documentation

## 2017-07-12 DIAGNOSIS — M1990 Unspecified osteoarthritis, unspecified site: Secondary | ICD-10-CM | POA: Diagnosis not present

## 2017-07-12 DIAGNOSIS — I10 Essential (primary) hypertension: Secondary | ICD-10-CM | POA: Insufficient documentation

## 2017-07-12 DIAGNOSIS — Z823 Family history of stroke: Secondary | ICD-10-CM | POA: Insufficient documentation

## 2017-07-12 DIAGNOSIS — E785 Hyperlipidemia, unspecified: Secondary | ICD-10-CM | POA: Diagnosis not present

## 2017-07-12 DIAGNOSIS — G4733 Obstructive sleep apnea (adult) (pediatric): Secondary | ICD-10-CM | POA: Diagnosis not present

## 2017-07-12 DIAGNOSIS — I4891 Unspecified atrial fibrillation: Secondary | ICD-10-CM | POA: Diagnosis not present

## 2017-07-12 HISTORY — PX: LOOP RECORDER INSERTION: EP1214

## 2017-07-12 HISTORY — PX: TEE WITHOUT CARDIOVERSION: SHX5443

## 2017-07-12 SURGERY — ECHOCARDIOGRAM, TRANSESOPHAGEAL
Anesthesia: Moderate Sedation

## 2017-07-12 SURGERY — LOOP RECORDER INSERTION
Anesthesia: LOCAL

## 2017-07-12 MED ORDER — MIDAZOLAM HCL 10 MG/2ML IJ SOLN
INTRAMUSCULAR | Status: DC | PRN
Start: 2017-07-12 — End: 2017-07-12
  Administered 2017-07-12: 1 mg via INTRAVENOUS
  Administered 2017-07-12 (×2): 2 mg via INTRAVENOUS

## 2017-07-12 MED ORDER — BUTAMBEN-TETRACAINE-BENZOCAINE 2-2-14 % EX AERO
INHALATION_SPRAY | CUTANEOUS | Status: DC | PRN
  Administered 2017-07-12: 2 via TOPICAL

## 2017-07-12 MED ORDER — FENTANYL CITRATE (PF) 100 MCG/2ML IJ SOLN
INTRAMUSCULAR | Status: DC | PRN
  Administered 2017-07-12 (×3): 25 ug via INTRAVENOUS

## 2017-07-12 MED ORDER — LIDOCAINE-EPINEPHRINE 1 %-1:100000 IJ SOLN
INTRAMUSCULAR | Status: DC | PRN
  Administered 2017-07-12: 30 mL

## 2017-07-12 MED ORDER — FENTANYL CITRATE (PF) 100 MCG/2ML IJ SOLN
INTRAMUSCULAR | Status: AC
  Filled 2017-07-12: qty 2

## 2017-07-12 MED ORDER — LIDOCAINE-EPINEPHRINE 1 %-1:100000 IJ SOLN
INTRAMUSCULAR | Status: AC
  Filled 2017-07-12: qty 1

## 2017-07-12 MED ORDER — SODIUM CHLORIDE 0.9 % IV SOLN
INTRAVENOUS | Status: DC
  Administered 2017-07-12: 500 mL via INTRAVENOUS

## 2017-07-12 MED ORDER — MIDAZOLAM HCL 5 MG/ML IJ SOLN
INTRAMUSCULAR | Status: AC
  Filled 2017-07-12: qty 2

## 2017-07-12 SURGICAL SUPPLY — 2 items
LOOP REVEAL LINQSYS (Prosthesis & Implant Heart) ×2 IMPLANT
PACK LOOP INSERTION (CUSTOM PROCEDURE TRAY) ×2 IMPLANT

## 2017-07-12 NOTE — CV Procedure (Signed)
    Transesophageal Echocardiogram Note  JOHANNAH ROZAS [id number] 08/03/1966  Procedure: Transesophageal Echocardiogram Indications: CVA  Procedure Details Consent: Obtained Time Out: Verified patient identification, verified procedure, site/side was marked, verified correct patient position, special equipment/implants available, Radiology Safety Procedures followed,  medications/allergies/relevent history reviewed, required imaging and test results available.  Performed  Medications:  During this procedure the patient is administered a total of Versed 5 mg and Fentanyl 75 mcg  to achieve and maintain moderate conscious sedation.  The patient's heart rate, blood pressure, and oxygen saturation are monitored continuously during the procedure. The period of conscious sedation is 30 minutes, of which I was present face-to-face 100% of this time.  Normal LV function; small oscillating density on posterior MV leaflet (? Redundant chordae); negative saline microcavitation study; full report to follow. Suggest blood cultures to screen for SBE.  Complications: No apparent complications Patient did tolerate procedure well.  Olga Millers, MD

## 2017-07-12 NOTE — Interval H&P Note (Signed)
History and Physical Interval Note:  07/12/2017 2:51 PM  [name]  has presented today for surgery, with the diagnosis of stroke  The various methods of treatment have been discussed with the patient and family. After consideration of risks, benefits and other options for treatment, the patient has consented to  Procedure(s): TRANSESOPHAGEAL ECHOCARDIOGRAM (TEE) (N/A) as a surgical intervention .  The patient's history has been reviewed, patient examined, no change in status, stable for surgery.  I have reviewed the patient's chart and labs.  Questions were answered to the patient's satisfaction.     Olga Millers

## 2017-07-12 NOTE — Interval H&P Note (Signed)
History and Physical Interval Note:  07/12/2017 3:21 PM  [name]  has presented today for surgery, with the diagnosis of tee first  The various methods of treatment have been discussed with the patient and family. After consideration of risks, benefits and other options for treatment, the patient has consented to  Procedure(s): LOOP RECORDER INSERTION (N/A) as a surgical intervention .  The patient's history has been reviewed, patient examined, no change in status, stable for surgery.  I have reviewed the patient's chart and labs.  Questions were answered to the patient's satisfaction.     Hillis Range

## 2017-07-12 NOTE — H&P (View-Only) (Signed)
  ELECTROPHYSIOLOGY CONSULT NOTE  Patient ID: [name] MRN: [id number], DOB/AGE: 10/23/1965   Admit date: 07/12/2017 Date of Consult: 07/12/2017  Primary Physician: Webb, Carol, MD Reason for Consultation: Cryptogenic stroke; recommendations regarding Implantable Loop Recorder  History of Present Illness EP has been asked to evaluate [name] for placement of an implantable loop recorder to monitor for atrial fibrillation by Dr Sethi.  The patient was admitted in September of 2018 with left lower extremity weakness.  Her symptoms developed 3 days prior to admission.    Imaging demonstrated multifocal right hemisphere acute infarcts.  She has undergone workup for stroke including echocardiogram and carotid dopplers.  The patient was monitored on telemetry which has demonstrated sinus rhythm with no arrhythmias.  Stroke work-up is to be completed with a TEE.   Echocardiogram this admission demonstrated EF 55-60%, no RWMA.  Lab work is reviewed.  The patient denies chest pain, shortness of breath, dizziness, palpitations, or syncope.    Past Medical History:  Diagnosis Date  . Allergic rhinitis   . Anemia   . Anxiety   . Arthritis   . Asthma       . Depression   . GERD (gastroesophageal reflux disease)   . Goiter   . Hypertension    no meds since wt loss  . OSA (obstructive sleep apnea)    cpap- setting at 9, no CPAP since weight loss  . Sarcoidosis    Pulmonary, skin involvement     Surgical History:  Past Surgical History:  Procedure Laterality Date  . ABDOMINAL HYSTERECTOMY    . BREATH TEK H PYLORI N/A 01/29/2014   Procedure: BREATH TEK H PYLORI;  Surgeon: David H Newman, MD;  Location: [facility];  Service: General;  Laterality: N/A;  . LAPAROSCOPIC GASTRIC SLEEVE RESECTION N/A 06/04/2014   Procedure: LAPAROSCOPIC GASTRIC SLEEVE RESECTION;  Surgeon: David H Newman, MD;  Location: [facility];  Service: General;  Laterality: N/A;  . OVARIAN CYST REMOVAL    .  PERCUTANEOUS PINNING Right 11/11/2015   Procedure: PERCUTANEOUS PINNING PROXIMAL PHALANX RIGHT SMALL FINGER  OPEN;  Surgeon: Gary Kuzma, MD;  Location: [facility];  Service: Orthopedics;  Laterality: Right;  . UPPER GI ENDOSCOPY  06/04/2014   Procedure: UPPER GI ENDOSCOPY;  Surgeon: David H Newman, MD;  Location: [facility];  Service: General;;     Prescriptions Prior to Admission  Medication Sig Dispense Refill Last Dose  . aspirin 325 MG tablet Take 1 tablet (325 mg total) by mouth daily. 30 tablet 0 07/11/2017 at 2200  . atorvastatin (LIPITOR) 80 MG tablet Take 1 tablet (80 mg total) by mouth daily at 6 PM. 30 tablet 0 07/11/2017 at 2200  . buPROPion (WELLBUTRIN XL) 300 MG 24 hr tablet Take 300 mg by mouth daily.   07/11/2017 at 2200  . escitalopram (LEXAPRO) 20 MG tablet Take 20 mg by mouth daily.   07/11/2017 at 2200  . folic acid (FOLVITE) 400 MCG tablet Take 400 mcg by mouth daily.   07/11/2017 at 2200  . hydroxychloroquine (PLAQUENIL) 200 MG tablet Take 400 mg by mouth daily.   07/11/2017 at 2200  . methotrexate (RHEUMATREX) 2.5 MG tablet Take 7.5-10 mg by mouth 2 (two) times a week. Take 4 tablets Tuesday and 3 tablets on Wednesday.......Caution:Chemotherapy. Protect from light.   Past Week  . NONFORMULARY OR COMPOUNDED ITEM Outpatient physical therapy evaluation and treatment. Indication:  Acute ischemic stroke with residual left lower extremity weakness. 1 each   0 Unknown at Unknown time  . NONFORMULARY OR COMPOUNDED ITEM Outpatient occupational therapy evaluation and treatment.  Indication:  Acute ischemic stroke with residual left lower extremity weakness. 1 each 0 Unknown at Unknown time    Inpatient Medications:   Allergies:  Allergies  Allergen Reactions  . Ibuprofen Itching    Social History   Social History  . Marital status: Single    Spouse name: N/A  . Number of children: N/A  . Years of education: N/A   Occupational History  . secretary    Social History  Main Topics  . Smoking status: Former Smoker    Packs/day: 0.50    Years: 12.00    Quit date: 10/11/1996  . Smokeless tobacco: Former User  . Alcohol use Yes     Comment: occassional  . Drug use: No  . Sexual activity: Not on file   Other Topics Concern  . Not on file   Social History Narrative  . No narrative on file     Family History  Problem Relation Age of Onset  . Heart disease Mother   . Kidney disease Father   . Sarcoidosis Brother   . Sarcoidosis Unknown        niece  . Hyperlipidemia Other   . Hypertension Other   . Stroke Other   . Obesity Other       Review of Systems: All other systems reviewed and are otherwise negative except as noted above.  Physical Exam: Vitals:   07/12/17 1450 07/12/17 1455 07/12/17 1500 07/12/17 1505  BP: (!) 154/77 100/65 138/81 (!) 161/100  Pulse: 66 67 80 75  Resp: 19 15 20 13  Temp:      TempSrc:      SpO2: 100% 100% 100% 100%  Weight:      Height:        GEN- The patient is well appearing, alert and oriented x 3 today.   Head- normocephalic, atraumatic Eyes-  Sclera clear, conjunctiva pink Ears- hearing intact Oropharynx- clear Neck- supple Lungs- Clear to ausculation bilaterally, normal work of breathing Heart- Regular rate and rhythm, no murmurs, rubs or gallops  GI- soft, NT, ND, + BS Extremities- no clubbing, cyanosis, or edema MS- no significant deformity or atrophy Skin- no rash or lesion Psych- euthymic mood, full affect   Labs:   Lab Results  Component Value Date   WBC 2.6 (L) 06/27/2017   HGB 13.6 06/27/2017   HCT 40.0 06/27/2017   MCV 89.2 06/27/2017   PLT 227 06/27/2017   No results for input(s): NA, K, CL, CO2, BUN, CREATININE, CALCIUM, PROT, BILITOT, ALKPHOS, ALT, AST, GLUCOSE in the last 168 hours.  Invalid input(s): LABALBU   Radiology/Studies:  Mr Mra Head Wo Contrast Result Date: 06/27/2017 CLINICAL DATA:  Sudden onset left foot drop EXAM: MRI HEAD WITHOUT CONTRAST MRA HEAD WITHOUT  CONTRAST TECHNIQUE: Multiplanar, multiecho pulse sequences of the brain and surrounding structures were obtained without intravenous contrast. Angiographic images of the head were obtained using MRA technique without contrast. COMPARISON:  Head CT 06/11/2009 FINDINGS: MRI HEAD FINDINGS Brain: The midline structures are normal. There is multifocal diffusion restriction within the right hemisphere, at the watershed stones between the right anterior and middle cerebral arteries and between the right middle and posterior cerebral arteries. No associated hemorrhage or mass effect. There is periventricular leukoaraiosis, most commonly seen in the setting of chronic hypertensive microangiopathy. No mass lesion. No chronic microhemorrhage or cerebral amyloid angiopathy. No hydrocephalus, age advanced   atrophy or lobar predominant volume loss. No dural abnormality or extra-axial collection. Skull and upper cervical spine: The visualized skull base, calvarium, upper cervical spine and extracranial soft tissues are normal. Sinuses/Orbits: No fluid levels or advanced mucosal thickening. No mastoid effusion. Normal orbits. MRA HEAD FINDINGS Intracranial internal carotid arteries: Normal. Anterior cerebral arteries: Normal. Middle cerebral arteries: Normal. Posterior communicating arteries: Absent bilaterally. Posterior cerebral arteries: Normal. Basilar artery: Normal. Vertebral arteries: Left dominant. Normal. Superior cerebellar arteries: Normal. Anterior inferior cerebellar arteries: Normal. Posterior inferior cerebellar arteries: Normal. IMPRESSION: 1. Multifocal acute ischemia within the right hemisphere at the watershed junctions between the right ACA and MCA and right MCA and PCA. 2. No hemorrhage or mass effect. 3. Normal intracranial MRA. Electronically Signed   By: Kevin  Herman M.D.   On: 06/27/2017 23:44   12-lead ECG sinus rhythm (personally reviewed) All prior EKG's in EPIC reviewed with no documented atrial  fibrillation  Telemetry sinus rhythm (personally reviewed)  Assessment and Plan:  1. Cryptogenic stroke The patient presented with cryptogenic stroke.  The patient has a TEE planned for today.  I spoke at length with the patient about monitoring for afib with an implantable loop recorder.  Risks, benefits, and alteratives to implantable loop recorder were discussed with the patient today.   At this time, the patient is very clear in their decision to proceed with implantable loop recorder.   Wound care was reviewed with the patient (keep incision clean and dry for 3 days).  Wound check scheduled and entered in AVS.  Please call with questions.   Linley Moxley, MD 07/12/2017 3:18 PM  I have seen, examined the patient, and reviewed the above assessment and plan.  Changes to above are made where necessary.   I have reviewed TEE in detail with Dr Crenshaw.  She has a very small structure of unknown significance seen on her mitral valve.  Dr Crenshaw does not feel that this is the cause for her stroke, though we cannot exclude this completely.  No further imaging is advised.  I will obtain blood cultures.  She has had no constitutional symptoms and therefore my suspicion for vegetation is low.  Will proceed with ILR implantation at this time.  Co Sign: Kooper Godshall, MD 07/12/2017 3:19 PM   

## 2017-07-12 NOTE — H&P (View-Only) (Signed)
STROKE TEAM PROGRESS NOTE   HISTORY OF PRESENT ILLNESS (per record) [name] is an 51 y.o. female with a history of sarcoidosis, depression, anxiety and arthritis who presented with weakness of sudden onset involving left lower extremity, mostly distally, for 3 days. She has not been able to move her ankle and also unable to move her toes. She has not experienced numbness involving left lower extremity. She's had no symptoms involving left upper extremity. There is been no change in speech and no facial droop. She has no history of stroke or TIA. Imaging studies of the brain are pending.  LSN: 06/24/2017 tPA Given: No: beyond time window for treatment consideration. mRankin:  Patient was not administered IV t-PA secondary to arriving outside of the tPA treatment window. She was admitted to General Neurology for further evaluation and treatment.   SUBJECTIVE (INTERVAL HISTORY) Her sister is at the bedside.  The pt is awake, alert, and oriented.   .  L-sided weakness persists.  MRI brain with contrast shows no enhancing lesions. MRI scan of the cervical and thoracic spine also did not show definite demyelinating lesions. Retrospective review of MRA of the brain from yesterday shows a focal signal loss of right A2/A3 junction suggestive of possible embolus with subtotal resolution. OBJECTIVE Temp:  [98 F (36.7 C)-98.6 F (37 C)] 98.4 F (36.9 C) (09/19 0900) Pulse Rate:  [76-83] 77 (09/19 0900) Cardiac Rhythm: Normal sinus rhythm;Bundle branch block (09/19 0700) Resp:  [18] 18 (09/19 0900) BP: (104-125)/(49-74) 125/74 (09/19 0900) SpO2:  [97 %-100 %] 98 % (09/19 0900)  CBC:   Recent Labs Lab 06/27/17 1455 06/27/17 1508  WBC 2.6*  --   NEUTROABS 1.5*  --   HGB 11.4* 13.6  HCT 34.8* 40.0  MCV 89.2  --   PLT 227  --     Basic Metabolic Panel:   Recent Labs Lab 06/27/17 1455 06/27/17 1508  NA 141 142  K 3.9 3.8  CL 103 102  CO2 29  --   GLUCOSE 93 87  BUN 10 8   CREATININE 1.07* 1.10*  CALCIUM 9.5  --     Lipid Panel:     Component Value Date/Time   CHOL 171 06/28/2017 0932   TRIG 41 06/28/2017 0932   HDL 67 06/28/2017 0932   CHOLHDL 2.6 06/28/2017 0932   VLDL 8 06/28/2017 0932   LDLCALC 96 06/28/2017 0932   HgbA1c:  Lab Results  Component Value Date   HGBA1C 5.0 06/28/2017   Urine Drug Screen:     Component Value Date/Time   LABOPIA NONE DETECTED 06/28/2017 1002   COCAINSCRNUR NONE DETECTED 06/28/2017 1002   LABBENZ NONE DETECTED 06/28/2017 1002   AMPHETMU NONE DETECTED 06/28/2017 1002   THCU NONE DETECTED 06/28/2017 1002   LABBARB NONE DETECTED 06/28/2017 1002    Alcohol Level     Component Value Date/Time   ETH <5 06/27/2017 1455    IMAGING  Dg Ankle Complete Left 06/27/2017 IMPRESSION: Avulsion fracture involving the base of the 5th metatarsal.  Mr [facility] Contrast Mr Brain Wo Contrast (neuro Protocol) 06/27/2017 IMPRESSION: 1. Multifocal acute ischemia within the right hemisphere at the watershed junctions between the right ACA and MCA and right MCA and PCA. 2. No hemorrhage or mass effect. 3. Normal intracranial MRA.  TTE 06/28/2017 Study Conclusions - Left ventricle: The cavity size was normal. Systolic function was normal. The estimated ejection fraction was in the range of 55% to 60%. Wall motion was  normal; there were no regional wall motion abnormalities. Left ventricular diastolic function parameters were normal.  Carotid US 06/28/2017 1-39% ICA plaquing. Vertebral artery flow is antegrade  TEE 06/28/2017 pending   PHYSICAL EXAM Pleasant middle aged lady not in distress. . Afebrile. Head is nontraumatic. Neck is supple without bruit.    Cardiac exam no murmur or gallop. Lungs are clear to auscultation. Distal pulses are well felt. Neurological Exam :  Awake alert oriented x 3 normal speech and language.Intact attention registration and recall. Mild left lower face asymmetry. Tongue midline. Mild  left lower extremity drift. Mild diminished fine finger movements on left. Orbits right over left upper extremity. Mild left grip weak.. Left lower extremity 4/5 strength proximally and left foot drop with 2/5 ankle dorsiflexors. Increased tone in the left lower extremity with sustained left ankle clonus. Normal sensation . Normal coordination. Deep tendon reflexes are brisk throughout but left greater than right. Gait not tested  ASSESSMENT/PLAN [name] is a 50 y.o. female with history ofJudy L Kidd is an 51 y.o. female with a history of depression, anxiety and arthritis who presented with weakness of sudden onset involving left lower extremity, mostly distally, for 3 days. She did not receive IV t-PA due to presenting outside of the tPA treatment window.     Stroke: Multifocal R hemisphere acute infarsts at watershed junctions between ACA and MCA, and MCA and PCA, likely embolic, of undetermined etiology.  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  MRA head: no LVO or high-grade stenosis  Carotid Doppler: 1-39% ICA plaquing. Vertebral artery flow is antegrade  2D Echo: EF 55-60%. No source of embolus   LDL 96  HgbA1c 5  Heparin 4,098 units every 8 hours, for VTE prophylaxis Diet Heart Room service appropriate? Yes; Fluid consistency: Thin  No antithrombotic prior to admission, now on   aspirin 325 mg  Patient counseled to be compliant with her antithrombotic medications  Ongoing aggressive stroke risk factor management  Therapy recommendations: pending  Disposition: pending  Hyperlipidemia  Home meds: none  LDL 96, goal < 70  Add atorvastatin 20 mg PO daily  Continue statin at discharge  Other Stroke Risk Factors  ETOH use, advised to drink no more than 1 drink(s) a day  Obesity,  Body mass index is 35.16 kg/m., recommend weight loss, diet and exercise as appropriate   Family hx stroke (mother)  OSA  Other Active Problems  None  Hospital day # 2   Brain MRI scan shows no enhancing lesions and MRA shows focal area of signal loss suggestive of possible embolus with partial recanalization. ANA is positive and anti-scleroderma antibodies are elevated but patient denies any history suggestive of lupus or scleroderma The patient may be discharged home with outpatient therapies on aspirin and Lipitor , referral to rheumatologist for consult, outpatient Transesophageal echocardiogram and loop recorder . Greater than 50% time during this 25 minute visit was spent on counseling and coordination of care about her strokes and plan for evaluation, treatment and prevention discussion. Discussed with Dr. Andrey Farmer, MD Medical Director [facility] Pager: [phone] 06/29/2017 3:05 PM   To contact Stroke Continuity provider, please refer to [email or web address]. After hours, contact General Neurology

## 2017-07-12 NOTE — Discharge Instructions (Signed)

## 2017-07-12 NOTE — Progress Notes (Signed)
  Echocardiogram Echocardiogram Transesophageal has been performed.  Janalyn Harder 07/12/2017, 3:21 PM

## 2017-07-12 NOTE — Consult Note (Signed)
ELECTROPHYSIOLOGY CONSULT NOTE  Patient ID: [name] MRN: [id number], DOB/AGE: Oct 28, 1965   Admit date: 07/12/2017 Date of Consult: 07/12/2017  Primary Physician: Shirlean Mylar, MD Reason for Consultation: Cryptogenic stroke; recommendations regarding Implantable Loop Recorder  History of Present Illness EP has been asked to evaluate [name] for placement of an implantable loop recorder to monitor for atrial fibrillation by Dr Pearlean Brownie.  The patient was admitted in September of 2018 with left lower extremity weakness.  Her symptoms developed 3 days prior to admission.    Imaging demonstrated multifocal right hemisphere acute infarcts.  She has undergone workup for stroke including echocardiogram and carotid dopplers.  The patient was monitored on telemetry which has demonstrated sinus rhythm with no arrhythmias.  Stroke work-up is to be completed with a TEE.   Echocardiogram this admission demonstrated EF 55-60%, no RWMA.  Lab work is reviewed.  The patient denies chest pain, shortness of breath, dizziness, palpitations, or syncope.    Past Medical History:  Diagnosis Date  . Allergic rhinitis   . Anemia   . Anxiety   . Arthritis   . Asthma       . Depression   . GERD (gastroesophageal reflux disease)   . Goiter   . Hypertension    no meds since wt loss  . OSA (obstructive sleep apnea)    cpap- setting at 9, no CPAP since weight loss  . Sarcoidosis    Pulmonary, skin involvement     Surgical History:  Past Surgical History:  Procedure Laterality Date  . ABDOMINAL HYSTERECTOMY    . BREATH TEK H PYLORI N/A 01/29/2014   Procedure: BREATH TEK H PYLORI;  Surgeon: Kandis Cocking, MD;  Location: [facility];  Service: General;  Laterality: N/A;  . LAPAROSCOPIC GASTRIC SLEEVE RESECTION N/A 06/04/2014   Procedure: LAPAROSCOPIC GASTRIC SLEEVE RESECTION;  Surgeon: Kandis Cocking, MD;  Location: [facility];  Service: General;  Laterality: N/A;  . OVARIAN CYST REMOVAL    .  PERCUTANEOUS PINNING Right 11/11/2015   Procedure: PERCUTANEOUS PINNING PROXIMAL PHALANX RIGHT SMALL FINGER  OPEN;  Surgeon: Cindee Salt, MD;  Location: [facility];  Service: Orthopedics;  Laterality: Right;  . UPPER GI ENDOSCOPY  06/04/2014   Procedure: UPPER GI ENDOSCOPY;  Surgeon: Kandis Cocking, MD;  Location: [facility];  Service: General;;     Prescriptions Prior to Admission  Medication Sig Dispense Refill Last Dose  . aspirin 325 MG tablet Take 1 tablet (325 mg total) by mouth daily. 30 tablet 0 07/11/2017 at 2200  . atorvastatin (LIPITOR) 80 MG tablet Take 1 tablet (80 mg total) by mouth daily at 6 PM. 30 tablet 0 07/11/2017 at 2200  . buPROPion (WELLBUTRIN XL) 300 MG 24 hr tablet Take 300 mg by mouth daily.   07/11/2017 at 2200  . escitalopram (LEXAPRO) 20 MG tablet Take 20 mg by mouth daily.   07/11/2017 at 2200  . folic acid (FOLVITE) 400 MCG tablet Take 400 mcg by mouth daily.   07/11/2017 at 2200  . hydroxychloroquine (PLAQUENIL) 200 MG tablet Take 400 mg by mouth daily.   07/11/2017 at 2200  . methotrexate (RHEUMATREX) 2.5 MG tablet Take 7.5-10 mg by mouth 2 (two) times a week. Take 4 tablets Tuesday and 3 tablets on Wednesday......Marland KitchenCaution:Chemotherapy. Protect from light.   Past Week  . NONFORMULARY OR COMPOUNDED ITEM Outpatient physical therapy evaluation and treatment. Indication:  Acute ischemic stroke with residual left lower extremity weakness. 1 each  0 Unknown at Unknown time  . NONFORMULARY OR COMPOUNDED ITEM Outpatient occupational therapy evaluation and treatment.  Indication:  Acute ischemic stroke with residual left lower extremity weakness. 1 each 0 Unknown at Unknown time    Inpatient Medications:   Allergies:  Allergies  Allergen Reactions  . Ibuprofen Itching    Social History   Social History  . Marital status: Single    Spouse name: N/A  . Number of children: N/A  . Years of education: N/A   Occupational History  . Diplomatic Services operational officer    Social History  Main Topics  . Smoking status: Former Smoker    Packs/day: 0.50    Years: 12.00    Quit date: 10/11/1996  . Smokeless tobacco: Former Neurosurgeon  . Alcohol use Yes     Comment: occassional  . Drug use: No  . Sexual activity: Not on file   Other Topics Concern  . Not on file   Social History Narrative  . No narrative on file     Family History  Problem Relation Age of Onset  . Heart disease Mother   . Kidney disease Father   . Sarcoidosis Brother   . Sarcoidosis Unknown        niece  . Hyperlipidemia Other   . Hypertension Other   . Stroke Other   . Obesity Other       Review of Systems: All other systems reviewed and are otherwise negative except as noted above.  Physical Exam: Vitals:   07/12/17 1450 07/12/17 1455 07/12/17 1500 07/12/17 1505  BP: (!) 154/77 100/65 138/81 (!) 161/100  Pulse: 66 67 80 75  Resp: Temp:      TempSrc:      SpO2: 100% 100% 100% 100%  Weight:      Height:        GEN- The patient is well appearing, alert and oriented x 3 today.   Head- normocephalic, atraumatic Eyes-  Sclera clear, conjunctiva pink Ears- hearing intact Oropharynx- clear Neck- supple Lungs- Clear to ausculation bilaterally, normal work of breathing Heart- Regular rate and rhythm, no murmurs, rubs or gallops  GI- soft, NT, ND, + BS Extremities- no clubbing, cyanosis, or edema MS- no significant deformity or atrophy Skin- no rash or lesion Psych- euthymic mood, full affect   Labs:   Lab Results  Component Value Date   WBC 2.6 (L) 06/27/2017   HGB 13.6 06/27/2017   HCT 40.0 06/27/2017   MCV 89.2 06/27/2017   PLT 227 06/27/2017   No results for input(s): NA, K, CL, CO2, BUN, CREATININE, CALCIUM, PROT, BILITOT, ALKPHOS, ALT, AST, GLUCOSE in the last 168 hours.  Invalid input(s): LABALBU   Radiology/Studies:  Mr Shirlee Latch ZO Contrast Result Date: 06/27/2017 CLINICAL DATA:  Sudden onset left foot drop EXAM: MRI HEAD WITHOUT CONTRAST MRA HEAD WITHOUT  CONTRAST TECHNIQUE: Multiplanar, multiecho pulse sequences of the brain and surrounding structures were obtained without intravenous contrast. Angiographic images of the head were obtained using MRA technique without contrast. COMPARISON:  Head CT 06/11/2009 FINDINGS: MRI HEAD FINDINGS Brain: The midline structures are normal. There is multifocal diffusion restriction within the right hemisphere, at the watershed stones between the right anterior and middle cerebral arteries and between the right middle and posterior cerebral arteries. No associated hemorrhage or mass effect. There is periventricular leukoaraiosis, most commonly seen in the setting of chronic hypertensive microangiopathy. No mass lesion. No chronic microhemorrhage or cerebral amyloid angiopathy. No hydrocephalus, age advanced  atrophy or lobar predominant volume loss. No dural abnormality or extra-axial collection. Skull and upper cervical spine: The visualized skull base, calvarium, upper cervical spine and extracranial soft tissues are normal. Sinuses/Orbits: No fluid levels or advanced mucosal thickening. No mastoid effusion. Normal orbits. MRA HEAD FINDINGS Intracranial internal carotid arteries: Normal. Anterior cerebral arteries: Normal. Middle cerebral arteries: Normal. Posterior communicating arteries: Absent bilaterally. Posterior cerebral arteries: Normal. Basilar artery: Normal. Vertebral arteries: Left dominant. Normal. Superior cerebellar arteries: Normal. Anterior inferior cerebellar arteries: Normal. Posterior inferior cerebellar arteries: Normal. IMPRESSION: 1. Multifocal acute ischemia within the right hemisphere at the watershed junctions between the right ACA and MCA and right MCA and PCA. 2. No hemorrhage or mass effect. 3. Normal intracranial MRA. Electronically Signed   By: Deatra Robinson M.D.   On: 06/27/2017 23:44   12-lead ECG sinus rhythm (personally reviewed) All prior EKG's in EPIC reviewed with no documented atrial  fibrillation  Telemetry sinus rhythm (personally reviewed)  Assessment and Plan:  1. Cryptogenic stroke The patient presented with cryptogenic stroke.  The patient has a TEE planned for today.  I spoke at length with the patient about monitoring for afib with an implantable loop recorder.  Risks, benefits, and alteratives to implantable loop recorder were discussed with the patient today.   At this time, the patient is very clear in their decision to proceed with implantable loop recorder.   Wound care was reviewed with the patient (keep incision clean and dry for 3 days).  Wound check scheduled and entered in AVS.  Please call with questions.   Hillis Range, MD 07/12/2017 3:18 PM  I have seen, examined the patient, and reviewed the above assessment and plan.  Changes to above are made where necessary.   I have reviewed TEE in detail with Dr Jens Som.  She has a very small structure of unknown significance seen on her mitral valve.  Dr Jens Som does not feel that this is the cause for her stroke, though we cannot exclude this completely.  No further imaging is advised.  I will obtain blood cultures.  She has had no constitutional symptoms and therefore my suspicion for vegetation is low.  Will proceed with ILR implantation at this time.  Co Sign: Hillis Range, MD 07/12/2017 3:19 PM

## 2017-07-13 ENCOUNTER — Encounter (HOSPITAL_COMMUNITY): Payer: Self-pay | Admitting: Cardiology

## 2017-07-14 NOTE — Progress Notes (Signed)
Referring-Pramod Leonie Man, MD Reason for referral-Stroke, MV abnormality  HPI: 51 yo female for evaluation of CVA/mitral valve abnormality at request of Antony Contras, MD. Admitted 9/17 with LLE weakness and found to have CVA on MRI (multifocal acute ischemia within the right hemisphere at the watershed junctions between the right ACA and MCA and right MCA and PCA). Pt also noted to have thyroid mass and outpt ultrasound recommended. Carotid dopplers showed 1-39 bilaterally. TEE showed normal LV function; small oscillating density on posterior MV leaflet; ? Redundant chord; negative saline microcavitation study; blood cultures and fu TEE in 3 months recommended. ESR 7. Pt also had ILR implanted. Cardiology asked to evaluate. Patient has dyspnea with more extreme activities not routine activities. No orthopnea, PND, pedal edema, syncope or chest pain. She has occasional brief palpitations not sustained.  Current Outpatient Prescriptions  Medication Sig Dispense Refill  . aspirin 325 MG tablet Take 1 tablet (325 mg total) by mouth daily. 30 tablet 0  . atorvastatin (LIPITOR) 80 MG tablet Take 1 tablet (80 mg total) by mouth daily at 6 PM. 30 tablet 0  . buPROPion (WELLBUTRIN XL) 300 MG 24 hr tablet Take 300 mg by mouth daily.    Marland Kitchen escitalopram (LEXAPRO) 20 MG tablet Take 20 mg by mouth daily.    . folic acid (FOLVITE) 798 MCG tablet Take 400 mcg by mouth daily.    . hydroxychloroquine (PLAQUENIL) 200 MG tablet Take 400 mg by mouth daily.    . methotrexate (RHEUMATREX) 2.5 MG tablet Take 7.5-10 mg by mouth 2 (two) times a week. Take 4 tablets Tuesday and 3 tablets on Wednesday......Marland KitchenCaution:Chemotherapy. Protect from light.    . NONFORMULARY OR COMPOUNDED ITEM Outpatient physical therapy evaluation and treatment. Indication:  Acute ischemic stroke with residual left lower extremity weakness. 1 each 0  . NONFORMULARY OR COMPOUNDED ITEM Outpatient occupational therapy evaluation and treatment.   Indication:  Acute ischemic stroke with residual left lower extremity weakness. 1 each 0  . omeprazole (PRILOSEC) 20 MG capsule Take 20 mg by mouth daily.  3   No current facility-administered medications for this visit.     Allergies  Allergen Reactions  . Ibuprofen Itching     Past Medical History:  Diagnosis Date  . Allergic rhinitis   . Anemia   . Anxiety   . Arthritis   . Asthma       . Depression   . GERD (gastroesophageal reflux disease)   . Goiter   . Hyperlipidemia   . Hypertension    no meds since wt loss  . OSA (obstructive sleep apnea)    cpap- setting at 9, no CPAP since weight loss  . Sarcoidosis    Pulmonary, skin involvement    Past Surgical History:  Procedure Laterality Date  . ABDOMINAL HYSTERECTOMY    . BREATH TEK H PYLORI N/A 01/29/2014   Procedure: BREATH TEK H PYLORI;  Surgeon: Shann Medal, MD;  Location: [facility];  Service: General;  Laterality: N/A;  . LAPAROSCOPIC GASTRIC SLEEVE RESECTION N/A 06/04/2014   Procedure: LAPAROSCOPIC GASTRIC SLEEVE RESECTION;  Surgeon: Shann Medal, MD;  Location: [facility];  Service: General;  Laterality: N/A;  . LOOP RECORDER INSERTION N/A 07/12/2017   Procedure: LOOP RECORDER INSERTION;  Surgeon: Thompson Grayer, MD;  Location: [facility];  Service: Cardiovascular;  Laterality: N/A;  . OVARIAN CYST REMOVAL    . PERCUTANEOUS PINNING Right 11/11/2015   Procedure: PERCUTANEOUS PINNING PROXIMAL PHALANX RIGHT SMALL FINGER  OPEN;  Surgeon: Daryll Brod, MD;  Location: [facility];  Service: Orthopedics;  Laterality: Right;  . TEE WITHOUT CARDIOVERSION N/A 07/12/2017   Procedure: TRANSESOPHAGEAL ECHOCARDIOGRAM (TEE);  Surgeon: Lelon Perla, MD;  Location: [place] [facility];  Service: Cardiovascular;  Laterality: N/A;  . UPPER GI ENDOSCOPY  06/04/2014   Procedure: UPPER GI ENDOSCOPY;  Surgeon: Shann Medal, MD;  Location: [facility];  Service: General;;    Social History   Social History  . Marital  status: Single    Spouse name: N/A  . Number of children: N/A  . Years of education: N/A   Occupational History  . Network engineer    Social History Main Topics  . Smoking status: Former Smoker    Packs/day: 0.50    Years: 12.00    Quit date: 10/11/1996  . Smokeless tobacco: Former Systems developer  . Alcohol use Yes     Comment: occassional  . Drug use: No  . Sexual activity: Not on file   Other Topics Concern  . Not on file   Social History Narrative  . No narrative on file    Family History  Problem Relation Age of Onset  . Heart disease Mother   . Kidney disease Father   . Sarcoidosis Brother   . Sarcoidosis Unknown        niece  . Hyperlipidemia Other   . Hypertension Other   . Stroke Other   . Obesity Other     ROS: no fevers or chills, productive cough, hemoptysis, dysphasia, odynophagia, melena, hematochezia, dysuria, hematuria, rash, seizure activity, orthopnea, PND, pedal edema, claudication. Remaining systems are negative.  Physical Exam:   Blood pressure 118/88, pulse 82, height '5\' 6"'$  (1.676 m), weight 111.6 kg (246 lb).  General:  Well developed/obese in NAD Skin warm/dry Patient not depressed No peripheral clubbing Back-normal HEENT-normal/normal eyelids Neck supple/normal carotid upstroke bilaterally; no bruits; no JVD; no thyromegaly chest - CTA/ normal expansion CV - RRR/normal S1 and S2; no murmurs, rubs or gallops;  PMI nondisplaced Abdomen -NT/ND, no HSM, no mass, + bowel sounds, no bruit 2+ femoral pulses, no bruits Ext-no edema, chords, 2+ DP Neuro-grossly nonfocal  ECG - 06/27/2017-sinus rhythm, right bundle branch block. personally reviewed  A/P  1 Recent CVA-etiology of event unclear. She has had an implantable loop monitor to screen for atrial fibrillation and will follow-up with Dr. Rayann Heman for this. I reviewed in depth her recent transesophageal echocardiogram. 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 It did not have  characteristics of a vegetation but we will await blood cultures. Note sedimentation rate normal. Question redundant or ruptured cord. I'm not convinced this was the cause of her CVA but cannot completely exclude. I will likely plan to repeat her transesophageal echocardiogram in 3-6 months to make sure that lesion is stable. I explained that the only intervention would be resection of this density and I'm not convinced she needs this at this point.   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  3 hyperlipidemia-continue statin.   4 Thyroid nodule-patient knows to follow-up with primary care for further evaluation.   5 Obesity-we discussed the importance of diet, exercise and weight loss.    Kirk Ruths, MD

## 2017-07-15 ENCOUNTER — Ambulatory Visit (INDEPENDENT_AMBULATORY_CARE_PROVIDER_SITE_OTHER): Payer: 59 | Admitting: Cardiology

## 2017-07-15 ENCOUNTER — Encounter: Payer: Self-pay | Admitting: Cardiology

## 2017-07-15 VITALS — BP 118/88 | HR 82 | Ht 66.0 in | Wt 246.0 lb

## 2017-07-15 DIAGNOSIS — I059 Rheumatic mitral valve disease, unspecified: Secondary | ICD-10-CM | POA: Diagnosis not present

## 2017-07-15 DIAGNOSIS — E7800 Pure hypercholesterolemia, unspecified: Secondary | ICD-10-CM | POA: Diagnosis not present

## 2017-07-15 DIAGNOSIS — I639 Cerebral infarction, unspecified: Secondary | ICD-10-CM

## 2017-07-15 NOTE — Patient Instructions (Signed)
Your physician wants you to follow-up in: 3 MONTHS WITH DR CRENSHAW You will receive a reminder letter in the mail two months in advance. If you don't receive a letter, please call our office to schedule the follow-up appointment.  

## 2017-07-17 LAB — CULTURE, BLOOD (ROUTINE X 2)
Culture: NO GROWTH
Culture: NO GROWTH
Special Requests: ADEQUATE
Special Requests: ADEQUATE

## 2017-07-20 ENCOUNTER — Ambulatory Visit: Payer: 59 | Attending: Family Medicine | Admitting: Physical Therapy

## 2017-07-20 DIAGNOSIS — R2689 Other abnormalities of gait and mobility: Secondary | ICD-10-CM

## 2017-07-20 DIAGNOSIS — R2681 Unsteadiness on feet: Secondary | ICD-10-CM | POA: Insufficient documentation

## 2017-07-20 DIAGNOSIS — M6281 Muscle weakness (generalized): Secondary | ICD-10-CM

## 2017-07-20 NOTE — Therapy (Signed)
[facility] [address] [place], [place], [address] Phone: [phone]   Fax:  [phone]  Physical Therapy Evaluation  Patient Details  Name: [name] MRN: [id number] Date of Birth: [birth year] Referring Provider: Shirlean Mylar, MD  Encounter Date: 07/20/2017      PT End of Session - 07/20/17 1752    Visit Number 1   Number of Visits 10   Date for PT Re-Evaluation 09/18/17   Authorization Type UHC   PT Start Time 1022   PT Stop Time 1107   PT Time Calculation (min) 45 min   Activity Tolerance Patient tolerated treatment well   Behavior During Therapy [facility] for tasks assessed/performed      Past Medical History:  Diagnosis Date  . Allergic rhinitis   . Anemia   . Anxiety   . Arthritis   . Asthma       . Depression   . GERD (gastroesophageal reflux disease)   . Goiter   . Hyperlipidemia   . Hypertension    no meds since wt loss  . OSA (obstructive sleep apnea)    cpap- setting at 9, no CPAP since weight loss  . Sarcoidosis    Pulmonary, skin involvement    Past Surgical History:  Procedure Laterality Date  . ABDOMINAL HYSTERECTOMY    . BREATH TEK H PYLORI N/A 01/29/2014   Procedure: BREATH TEK H PYLORI;  Surgeon: Kandis Cocking, MD;  Location: [facility];  Service: General;  Laterality: N/A;  . LAPAROSCOPIC GASTRIC SLEEVE RESECTION N/A 06/04/2014   Procedure: LAPAROSCOPIC GASTRIC SLEEVE RESECTION;  Surgeon: Kandis Cocking, MD;  Location: [facility];  Service: General;  Laterality: N/A;  . LOOP RECORDER INSERTION N/A 07/12/2017   Procedure: LOOP RECORDER INSERTION;  Surgeon: Hillis Range, MD;  [facility]: [facility];  Service: Cardiovascular;  Laterality: N/A;  . OVARIAN CYST REMOVAL    . PERCUTANEOUS PINNING Right 11/11/2015   Procedure: PERCUTANEOUS PINNING PROXIMAL PHALANX RIGHT SMALL FINGER  OPEN;  Surgeon: Cindee Salt, MD;  Location: [facility];  Service: Orthopedics;   Laterality: Right;  . TEE WITHOUT CARDIOVERSION N/A 07/12/2017   Procedure: TRANSESOPHAGEAL ECHOCARDIOGRAM (TEE);  Surgeon: Lewayne Bunting, MD;  Location: [facility];  Service: Cardiovascular;  Laterality: N/A;  . UPPER GI ENDOSCOPY  06/04/2014   Procedure: UPPER GI ENDOSCOPY;  Surgeon: Kandis Cocking, MD;  Location: [facility];  Service: General;;    There were no vitals filed for this visit.       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  LLE was weaker to where she could hardly move it, and now she is getting stronger.  She used walker upon coming home from hospital, but is not using any device now.  No falls reported since the CVA; she had three when CVA was beginning.   Pertinent History anemia, anxiety, arthritis, asthma, depression, GERD, HTN   Patient Stated Goals Pt's goals for therapy are to continue strengthening L leg.   Currently in Pain? No/denies            [facility] PT Assessment - 07/20/17 1029      Assessment   Medical Diagnosis CVA with LLE weakness   Referring Provider Shirlean Mylar, MD   Onset Date/Surgical Date 06/27/17   Hand Dominance Right     Precautions   Precautions Fall  Loop recorder placed  Balance Screen   Has the patient fallen in the past 6 months Yes   How many times? 3   Has the patient had a decrease in activity level because of a fear of falling?  Yes   Is the patient reluctant to leave their home because of a fear of falling?  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    Leisure Enjoys watching TV and playing on phone     Observation/Other Assessments   Focus on Therapeutic Outcomes (FOTO)  Functional Intake Status measure:  60; Neuro QOL:  41.6     Sensation   Light Touch Appears Intact     Posture/Postural Control   Posture/Postural Control Postural limitations   Postural Limitations Forward head;Rounded Shoulders     ROM / Strength   AROM / PROM / Strength Strength     Strength   Overall Strength Deficits   Strength Assessment Site Hip;Knee;Ankle   Right/Left Hip Right;Left   Right Hip Flexion 4/5   Left Hip Flexion 4/5   Right/Left Knee Right;Left   Right Knee Flexion 4/5   Right Knee Extension 5/5   Left Knee Flexion 4/5   Left Knee Extension 4/5   Right/Left Ankle Right;Left   Right Ankle Dorsiflexion 4/5   Left Ankle Dorsiflexion 3-/5  pulls more into L inversion   Left Ankle Inversion 5/5   Left Ankle Eversion 4/5     Transfers   Transfers Sit to Stand;Stand to Sit   Sit to Stand 6: Modified independent (Device/Increase time);With upper extremity assist;Without upper extremity assist;From chair/3-in-1   Stand to Sit 6: Modified independent (Device/Increase time);With upper extremity assist;Without upper extremity assist;To chair/3-in-1     Ambulation/Gait   Ambulation/Gait Yes   Ambulation/Gait Assistance 5: Supervision   Ambulation Distance (Feet) 120 Feet   Assistive device None   Gait Pattern Step-through pattern;Decreased step length - left;Decreased dorsiflexion - left;Trendelenburg;Wide base of support;Left foot flat   Ambulation Surface Level;Indoor   Gait velocity 13.90sec = 2.36 ft/sec     Standardized Balance Assessment   Standardized Balance Assessment Berg Balance Test;Timed Up and Go Test     Berg Balance Test   Sit to Stand Able to stand without using hands and stabilize independently   Standing  Unsupported Able to stand safely 2 minutes   Sitting with Back Unsupported but Feet Supported on Floor or Stool Able to sit safely and securely 2 minutes   Stand to Sit Sits safely with minimal use of hands   Transfers Able to transfer safely, minor use of hands   Standing Unsupported with Eyes Closed Able to stand 10 seconds safely   Standing Ubsupported with Feet Together Able to place feet together independently and stand 1 minute safely   From Standing, Reach Forward with Outstretched Arm Can reach forward >12 cm safely (5")   From Standing Position, Pick up Object from Floor Able to pick up shoe safely and easily   From Standing  Position, Turn to Look Behind Over each Shoulder Looks behind from both sides and weight shifts well   Turn 360 Degrees Able to turn 360 degrees safely but slowly   Standing Unsupported, Alternately Place Feet on Step/Stool Able to stand independently and safely and complete 8 steps in 20 seconds   Standing Unsupported, One Foot in Front Able to plae foot ahead of the other independently and hold 30 seconds   Standing on One Leg Able to lift leg independently and hold equal to or more than 3 seconds  <1 sec on LLE, >3 seconds on RLE SLS   Total Score 50   Berg comment: Scores <45/56 indicate increased fall risk.     Timed Up and Go Test   Normal TUG (seconds) 15.12   TUG Comments Scores >13.5 sec indicate increased fall risk.            Objective measurements completed on examination: See above findings.           Therapeutic Exercise: -Seated ankle pumps x 10 reps -SEated heelslides with cues for neutral ankle position, x 5 reps, LLE -Added the above exercises to HEP and instructed with written instructions. (Instruction portion of EPIC was not available during pt's eval)       PT Education - 07/20/17 1750    Education provided Yes   Education Details Results of eval testing and POC; initiated HEP-see instructions   Person(s) Educated  Patient   Methods Explanation;Demonstration;Handout   Comprehension Verbalized understanding;Returned demonstration             PT Long Term Goals - 07/20/17 1759      PT LONG TERM GOAL #1   Title Pt will be independent with HEP for improved strength and balance.  TARGET 08/19/17   Time 5   Period Weeks   Status New   Target Date 08/19/17     PT LONG TERM GOAL #2   Title Pt will improve TUG score to less than or equal to 13.5 seconds for decreased fall risk.   Time 5   Period Weeks   Status New   Target Date 08/19/17     PT LONG TERM GOAL #3   Title Pt will improve gait velocity to at least 2.62 ft/sec for improved community ambulation.   Time 5   Period Weeks   Status New   Target Date 08/19/17     PT LONG TERM GOAL #4   Title Dynamic Gait Index score to be assessed, with goal to be written as appropriate.   Time 5   Period Weeks   Status New   Target Date 08/19/17     PT LONG TERM GOAL #5   Title Pt will verbalize understanding of fall prevention in home environment.     Time 5   Period Weeks   Status New   Target Date 08/19/17     Additional Long Term Goals   Additional Long Term Goals Yes     PT LONG TERM GOAL #6   Title Pt will ambulate at least 1000 ft, indoor and outdoor surfaces, independenlty, no LOB for improved gait efficiency and safety.   Time 5   Period Weeks   Status New   Target Date 08/19/17                Plan - 07/20/17 1752    Clinical Impression Statement Pt is a 51 year old female who presents to OP PT with history of  CVA with L sided weakness.  Pt was hospitalized from 9/17-9/19/18.  She presents with decreased LLE strength, decreased flexibility, decreased balance, decreased coordination/timing with gait, decreased independence and safety with gait.  Pt was independent with ambulation, household tasks and working prior to CVA.  Pt would benefit from skilled PT to address the above stated deficits to improve functional mobility  and decrease fall risk.   History and Personal Factors relevant to plan of care: PMH >3 co-morbidities, independent prior to CVA, >3 system involvement   Clinical Presentation Evolving   Clinical Presentation due to: fall risk per TUG, decreased gait velocity, hx of CVA<1 month ago   Clinical Decision Making Moderate   Rehab Potential Good   PT Frequency 2x / week   PT Duration Other (comment)  5 weeks, including eval week   PT Treatment/Interventions ADLs/Self Care Home Management;Gait training;DME Instruction;Neuromuscular re-education;Balance training;Therapeutic exercise;Functional mobility training;Patient/family education;Orthotic Fit/Training   PT Next Visit Plan Review exercises given this visit and continue to add to HEP for LLE strengthening, balance; gait activities (with cane if needed); may perform DGI   Consulted and Agree with Plan of Care Patient      Patient will benefit from skilled therapeutic intervention in order to improve the following deficits and impairments:  Abnormal gait, Decreased balance, Decreased range of motion, Decreased mobility, Decreased strength, Difficulty walking, Impaired flexibility  Visit Diagnosis: Other abnormalities of gait and mobility  Muscle weakness (generalized)  Unsteadiness on feet     Problem List Patient Active Problem List   Diagnosis Date Noted  . Weakness of left foot 06/27/2017  . Morbid obesity, initial weight - 300, BMI - 48.5 01/07/2014  . Mild persistent asthma, well controlled 07/26/2012  . SLEEP APNEA, OBSTRUCTIVE 01/10/2008  . Pulmonary sarcoidosis (HCC) 07/20/2007    Beanca Kiester W. 07/20/2017, 6:03 PM  Gean Maidens., PT   [facility] [address] [place], [place], [address] Phone: [phone]   Fax:  [phone]  Name: [name] MRN: [id number] Date of Birth: 05/08/1966

## 2017-07-20 NOTE — Patient Instructions (Signed)
ANKLE: Pumps    Point toes down, then up. ___ reps per set, ___ sets per day, ___ days per week   Copyright  VHI. All rights reserved.  FLEXION: Sitting (Active)    Sit with right leg extended. Bend knee and draw foot backward. Use ___ lbs. Complete ___ sets of ___ repetitions. Perform ___ sessions per day.  Copyright  VHI. All rights reserved.

## 2017-07-22 ENCOUNTER — Ambulatory Visit: Payer: 59 | Admitting: Physical Therapy

## 2017-07-27 ENCOUNTER — Ambulatory Visit (INDEPENDENT_AMBULATORY_CARE_PROVIDER_SITE_OTHER): Payer: Self-pay | Admitting: *Deleted

## 2017-07-27 DIAGNOSIS — I639 Cerebral infarction, unspecified: Secondary | ICD-10-CM

## 2017-07-27 LAB — CUP PACEART INCLINIC DEVICE CHECK
Date Time Interrogation Session: 20181017140158
Implantable Pulse Generator Implant Date: 20181002

## 2017-07-27 NOTE — Progress Notes (Signed)
Wound check in clinic s/p ILR implant. Steri strips removed prior to appt. Wound well healed without redness or edema. Incision edges approximated. Normal device function. Battery status: GOOD. R-waves 0.6340mV. 0 symptom episodes, 0 tachy episodes, 0 pause episodes, 0 brady episodes. 0 AF episodes (0% burden). Patient education completed including wound care and remote monitoring. Summary reports and ROV with JA PRN.

## 2017-07-29 ENCOUNTER — Ambulatory Visit: Payer: 59 | Admitting: Physical Therapy

## 2017-07-29 DIAGNOSIS — R2689 Other abnormalities of gait and mobility: Secondary | ICD-10-CM

## 2017-07-29 DIAGNOSIS — M6281 Muscle weakness (generalized): Secondary | ICD-10-CM | POA: Diagnosis not present

## 2017-07-29 NOTE — Patient Instructions (Addendum)
HIP / KNEE: Extension - Sit to Stand    Scoot out to the edge of the chair, tuck in your feet behind your knees.  Lean chest forward, raise hips up from surface. Straighten hips and knees. Weight bear equally on left and right sides. Backs of legs should not push off surface.  Use your hands if needed, to lightly help you push up to stand. __10_ reps per set, _1-2__ sets per day.  Copyright  VHI. All rights reserved.  ABDUCTION: Standing (Active)    Stand tall, feet flat. Lift right leg out to side.  Complete _2__ sets of _10__ repetitions. Perform _1-2__ sessions per day.  (One set alternating kicks, second set consecutive kicks same leg)  http://gtsc.exer.us/111   Copyright  VHI. All rights reserved.  EXTENSION: Standing (Active)    Stand tall, both feet flat. Kick right leg behind body as far as possible.  Complete _2_ sets of _10__ repetitions. Perform __1-2_ sessions per day.  (one set alternating legs, second set consecutive kicks same leg)  http://gtsc.exer.us/77   Copyright  VHI. All rights reserved.  "I love a Parade" Lift    Using a chair if necessary, march in place  Repeat __2 sets of 10__ times. Do ___1-2_ sessions per day.  http://gt2.exer.us/345   Copyright  VHI. All rights reserved.  ANKLE: Dorsiflexion (Band)    Sit at edge of surface. Place band around top of foot. Keeping heel on floor, raise toes of banded foot. Hold _3__ seconds. Use ___red_____ band. _10__ reps per set, _2_ sets per day  Copyright  VHI. All rights reserved.  ANKLE: Eversion, Unilateral (Band)    Place band around left foot. Keeping heel in place, raise toes of banded foot up and away from body. Do not move hip. Hold _3__ seconds. Use ___red_____ band. __10_ reps per set, _2__ sets per day  Copyright  VHI. All rights reserved.

## 2017-07-29 NOTE — Therapy (Signed)
[facility] [address] [place], [place], [address] Phone: [phone]   Fax:  [phone]  Physical Therapy Treatment  Patient Details  Name: [name] MRN: [id number] Date of Birth: 09/08/1966 Referring Provider: Maurice Small, MD  Encounter Date: 07/29/2017      PT End of Session - 07/29/17 0847    Visit Number 2   Number of Visits 10   Date for PT Re-Evaluation 09/18/17   Authorization Type UHC   PT Start Time 0803   PT Stop Time 0843   PT Time Calculation (min) 40 min   Activity Tolerance Patient tolerated treatment well   Behavior During Therapy [facility] for tasks assessed/performed      Past Medical History:  Diagnosis Date  . Allergic rhinitis   . Anemia   . Anxiety   . Arthritis   . Asthma       . Depression   . GERD (gastroesophageal reflux disease)   . Goiter   . Hyperlipidemia   . Hypertension    no meds since wt loss  . OSA (obstructive sleep apnea)    cpap- setting at 9, no CPAP since weight loss  . Sarcoidosis    Pulmonary, skin involvement    Past Surgical History:  Procedure Laterality Date  . ABDOMINAL HYSTERECTOMY    . BREATH TEK H PYLORI N/A 01/29/2014   Procedure: BREATH TEK H PYLORI;  Surgeon: Shann Medal, MD;  Location: [facility];  Service: General;  Laterality: N/A;  . LAPAROSCOPIC GASTRIC SLEEVE RESECTION N/A 06/04/2014   Procedure: LAPAROSCOPIC GASTRIC SLEEVE RESECTION;  Surgeon: Shann Medal, MD;  Location: [facility];  Service: General;  Laterality: N/A;  . LOOP RECORDER INSERTION N/A 07/12/2017   Procedure: LOOP RECORDER INSERTION;  Surgeon: Thompson Grayer, MD;  Location: [facility];  Service: Cardiovascular;  Laterality: N/A;  . OVARIAN CYST REMOVAL    . PERCUTANEOUS PINNING Right 11/11/2015   Procedure: PERCUTANEOUS PINNING PROXIMAL PHALANX RIGHT SMALL FINGER  OPEN;  Surgeon: Daryll Brod, MD;  Location: [facility];  Service: Orthopedics;   Laterality: Right;  . TEE WITHOUT CARDIOVERSION N/A 07/12/2017   Procedure: TRANSESOPHAGEAL ECHOCARDIOGRAM (TEE);  Surgeon: Lelon Perla, MD;  Location: [facility] END[facility]COPY;  Service: Cardiovascular;  Laterality: N/A;  . UPPER GI ENDOSCOPY  06/04/2014   Procedure: UPPER GI ENDOSCOPY;  Surgeon: Shann Medal, MD;  Location: [facility];  Service: General;;    There were no vitals filed for this visit.      Subjective Assessment - 07/29/17 0805    Subjective No changes, nothing new today.   Pertinent History anemia, anxiety, arthritis, asthma, depression, GERD, HTN   Patient Stated Goals Pt's goals for therapy are to continue strengthening L leg.   Currently in Pain? No/denies            [facility] PT Assessment - 07/29/17 0001      Standardized Balance Assessment   Standardized Balance Assessment Dynamic Gait Index     Dynamic Gait Index   Level Surface Mild Impairment   Change in Gait Speed Mild Impairment   Gait with Horizontal Head Turns Mild Impairment   Gait with Vertical Head Turns Mild Impairment   Gait and Pivot Turn Normal   Step Over Obstacle Mild Impairment   Step Around Obstacles Mild Impairment   Steps Mild Impairment   Total Score 17   DGI comment: Scores <19/24 indicate increased fall risk  [facility] Adult PT Treatment/Exercise - 07/29/17 0806      Exercises   Exercises Knee/Hip;Ankle     Knee/Hip Exercises: Aerobic   Stepper Seated SciFit Stepper, Level 1.5, lower extremities only, x 7 minutes for lower extremity strengthening (pt cued and is able to keep RPM >50)     Knee/Hip Exercises: Standing   Heel Raises Both;1 set;10 reps  Heel/toe raises   Knee Flexion AROM;Strengthening;Right;Left;2 sets;10 reps  Marching in place    Hip Abduction AROM;Right;Left;10 reps;2 sets   Hip Extension AROM;Stengthening;Right;Left;2 sets;10 reps;Knee straight   Other Standing Knee Exercises Sit to stand from mat surface, 8 reps, with cues for foot  placement, then 9 reps from 18" chair height for lower extremity strengthening.     Ankle Exercises: Seated   Heel Raises 10 reps;3 seconds   Toe Raise 10 reps;3 seconds   Heel Slides Left;10 reps   Other Seated Ankle Exercises Seated resisted ankle dorsiflexion with red theraband, x 10 reps; seated resisted eversion, red theraband, 10 reps, LLE     Reviewed seated ankle pumps and LLE heelslides provided last visit, with pt return demo understanding.           PT Education - 07/29/17 1039    Education provided Yes   Education Details Additions to [employer]) Educated Patient   Methods Explanation;Demonstration;Handout   Comprehension Verbalized understanding;Returned demonstration             PT Long Term Goals - 07/20/17 1759      PT LONG TERM GOAL #1   Title Pt will be independent with HEP for improved strength and balance.  TARGET 08/19/17   Time 5   Period Weeks   Status New   Target Date 08/19/17     PT LONG TERM GOAL #2   Title Pt will improve TUG score to less than or equal to 13.5 seconds for decreased fall risk.   Time 5   Period Weeks   Status New   Target Date 08/19/17     PT LONG TERM GOAL #3   Title Pt will improve gait velocity to at least 2.62 ft/sec for improved community ambulation.   Time 5   Period Weeks   Status New   Target Date 08/19/17     PT LONG TERM GOAL #4   Title Dynamic Gait Index score to be assessed, with goal to be written as appropriate.   Time 5   Period Weeks   Status New   Target Date 08/19/17     PT LONG TERM GOAL #5   Title Pt will verbalize understanding of fall prevention in home environment.     Time 5   Period Weeks   Status New   Target Date 08/19/17     Additional Long Term Goals   Additional Long Term Goals Yes     PT LONG TERM GOAL #6   Title Pt will ambulate at least 1000 ft, indoor and outdoor surfaces, independenlty, no LOB for improved gait efficiency and safety.   Time 5   Period Weeks    Status New   Target Date 08/19/17               Plan - 07/29/17 0848    Clinical Impression Statement Treatment session focused on additions to HEP to address lower extremity strengthening.  Dynamic Gait Index score 17/24 indicates increased fall risk, and discussed with patient during session, plans to address gait and strengtheing activities.  (  At end of session, pt spoke to receptionist about cancelling remaining appointments-unknown to PT; PT followed up by phone call and pt feels comfortable with exercises provided today and does not want to return to therapy)   Rehab Potential Good   PT Frequency 2x / week   PT Duration Other (comment)  5 weeks, including eval week   PT Treatment/Interventions ADLs/Self Care Home Management;Gait training;DME Instruction;Neuromuscular re-education;Balance training;Therapeutic exercise;Functional mobility training;Patient/family education;Orthotic Fit/Training   PT Next Visit Plan Discharge this visit, per pt's request after therapy session to cancel remaining appointments   Consulted and Agree with Plan of Care Patient      Patient will benefit from skilled therapeutic intervention in order to improve the following deficits and impairments:  Abnormal gait, Decreased balance, Decreased range of motion, Decreased mobility, Decreased strength, Difficulty walking, Impaired flexibility  Visit Diagnosis: Muscle weakness (generalized)  Other abnormalities of gait and mobility     Problem List Patient Active Problem List   Diagnosis Date Noted  . Weakness of left foot 06/27/2017  . Morbid obesity, initial weight - 300, BMI - 48.5 01/07/2014  . Mild persistent asthma, well controlled 07/26/2012  . SLEEP APNEA, OBSTRUCTIVE 01/10/2008  . Pulmonary sarcoidosis ([place]) 07/20/2007    Evalin Shawhan W. 07/29/2017, 10:45 AM  Frazier Butt., PT   [facility] [address] [place],  [place], [address] Phone: [phone]   Fax:  [phone]  Name: [name] MRN: [id number] Date of Birth: 28-Mar-1966   PHYSICAL THERAPY DISCHARGE SUMMARY  Visits from Start of Care: 2  Current functional level related to goals / functional outcomes: LTGs not met, as pt requests to discharge following PT visit today-unable to be fully addressed.   Remaining deficits: See eval-decreased strength, decreased balance, gait    Education / Equipment: Initiated HEP, but unable to complete likely full course of HEP due to pt requesting to discharge following first treatment visit.  Plan: Patient agrees to discharge.  Patient goals were not met. Patient is being discharged due to the patient's request.  ?????Following PT visit today, pt requests that receptionist cancel remaining PT visits (was not discussed in PT session today).  PT was able to follow up by telephone call at 10:30 am today and pt feels she has what she needs exercise-wise to be finished with PT.  Therapist reminded patient of POC to address deficits from CVA, and pt declines further PT at this time.           Mady Haagensen, PT 07/29/17 10:49 AM Phone: [phone] Fax: [phone]

## 2017-08-03 ENCOUNTER — Ambulatory Visit: Payer: 59 | Admitting: Physical Therapy

## 2017-08-05 ENCOUNTER — Ambulatory Visit: Payer: 59 | Admitting: Physical Therapy

## 2017-08-08 ENCOUNTER — Ambulatory Visit: Payer: 59 | Admitting: Physical Therapy

## 2017-08-10 ENCOUNTER — Ambulatory Visit: Payer: 59 | Admitting: Physical Therapy

## 2017-08-11 ENCOUNTER — Ambulatory Visit (INDEPENDENT_AMBULATORY_CARE_PROVIDER_SITE_OTHER): Payer: 59 | Admitting: *Deleted

## 2017-08-11 DIAGNOSIS — I639 Cerebral infarction, unspecified: Secondary | ICD-10-CM

## 2017-08-12 NOTE — Progress Notes (Signed)
[facility] Summary Report / Loop Recorder 

## 2017-08-15 ENCOUNTER — Ambulatory Visit: Payer: 59 | Admitting: Physical Therapy

## 2017-08-15 LAB — CUP PACEART REMOTE DEVICE CHECK
Date Time Interrogation Session: 20181101193727
Implantable Pulse Generator Implant Date: 20181002

## 2017-08-17 ENCOUNTER — Ambulatory Visit: Payer: 59 | Admitting: Physical Therapy

## 2017-08-18 ENCOUNTER — Ambulatory Visit: Payer: 59 | Admitting: Neurology

## 2017-08-18 ENCOUNTER — Encounter: Payer: Self-pay | Admitting: Neurology

## 2017-08-18 VITALS — BP 141/98 | HR 83 | Ht 66.0 in | Wt 248.0 lb

## 2017-08-18 DIAGNOSIS — I639 Cerebral infarction, unspecified: Secondary | ICD-10-CM | POA: Diagnosis not present

## 2017-08-18 NOTE — Progress Notes (Signed)
[facility] [address] [place]. [place] [address] [phone]       OFFICE FOLLOW-UP NOTE  Bonnie. [name] Date of Birth:  [birth year] Medical Record Number:  [id number]   HPI: [name] is a 51 year African-American lady seen today for first office follow-up visit following hospital admission for stroke in September 2018.  She is accompanied by sister and history is obtained from them as well as review of electronic medical records.  I personally reviewed imaging films.Bonnie Hoopingarner Smithis an 51 y.o.female with a history of sarcoidosis, depression, anxiety and arthritis who presented with weakness of sudden onset involving left lower extremity, mostly distally, for 3 days. She has not been able to move her ankle and also unable to move her toes. She has not experienced numbness involving left lower extremity. She's had no symptoms involving left upper extremity. There is been no change in speech and no facial droop. She has no history of stroke or TIA  .LSN:06/24/2017.tPA Given:No: beyond time window for treatment consideration.  MRI scan of the brain showed multifocal small infarcts in the right hemisphere at the watershed junctions of the right ACA/MCA and right MCA/PCA.  MRA of the brain was read as normal but upon review showed a focal area of signal loss at the right A2/A3 junction which could be an embolus.  Carotid ultrasound showed no significant extracranial stenosis.  Transthoracic echo showed normal ejection fraction.  Transesophageal echocardiogram showed no cardiac source of embolism or PFO.  LDL cholesterol was 96 mg percent.  Hemoglobin A1c was 5..  Patient was started on aspirin for stroke prevention and Lipitor.  She had a history of sarcoidosis and hence underwent MRI scan of the spine which did not show any compressive lesions in the cervical or thoracic spine.  Lab work for vascullitis showed positive ANA and scleroderma IgG ENA antibody titer was elevated.  Patient  had no symptoms suggestive of a rheumatological disorder.  She was referred to rheumatologist Dr. Zenovia Jordan whom she has seen as an outpatient who evaluated the patient and asked her to continue her methotrexate and Plaquenil for her sarcoidosis.  The patient states she is done well she is finished outpatient therapies.  She is regained most of her strength on the left side.  She has only mild left toe extensor weakness.  She is not using any assistive device.  She has known history of sleep apnea but after she underwent weight loss surgery she discontinued using CPAP.  She admits she is gained back about 3040 pounds.  She wants to lose weight rather than go back on CPAP.  She is tolerating aspirin well without bleeding or bruising.  She is tolerating Lipitor without muscle aches and pains.  She had loop recorder inserted and so far paroxysmal atrial fibrillation has not had been found.   .     ROS:   14 system review of systems is positive for leg weakness only and all other systems negative  PMH:  Past Medical History:  Diagnosis Date  . Allergic rhinitis   . Anemia   . Anxiety   . Arthritis   . Asthma       . Depression   . GERD (gastroesophageal reflux disease)   . Goiter   . Hyperlipidemia   . Hypertension    no meds since wt loss  . OSA (obstructive sleep apnea)    cpap- setting at 9, no CPAP since weight loss  . Sarcoidosis  Pulmonary, skin involvement  . Stroke [facility](HCC)     Social History:  Social History   Socioeconomic History  . Marital status: Single    Spouse name: Not on file  . Number of children: Not on file  . Years of education: Not on file  . Highest education level: Not on file  Social Needs  . Financial resource strain: Not on file  . Food insecurity - worry: Not on file  . Food insecurity - inability: Not on file  . Transportation needs - medical: Not on file  . Transportation needs - non-medical: Not on file  Occupational History  .  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 Smoking status: Former Smoker    Packs/day: 0.50    Years: 12.00    Pack years: 6.00    Last attempt to quit: 10/11/1996    Years since quitting: 20.8  . Smokeless tobacco: Former Engineer, waterUser  Substance and Sexual Activity  . Alcohol use: Yes    Comment: occassional  . Drug use: No  . Sexual activity: Not on file  Other Topics Concern  . Not on file  Social History Narrative  . Not on file    Medications:   Current Outpatient Medications on File Prior to Visit  Medication Sig Dispense Refill  . aspirin 325 MG tablet Take 1 tablet (325 mg total) by mouth daily. 30 tablet 0  . atorvastatin (LIPITOR) 80 MG tablet Take 1 tablet (80 mg total) by mouth daily at 6 PM. 30 tablet 0  . buPROPion (WELLBUTRIN XL) 300 MG 24 hr tablet Take 300 mg by mouth daily.    Marland Kitchen. escitalopram (LEXAPRO) 20 MG tablet Take 20 mg by mouth daily.    . folic acid (FOLVITE) 400 MCG tablet Take 400 mcg by mouth daily.    . hydroxychloroquine (PLAQUENIL) 200 MG tablet Take 400 mg by mouth daily.    . methotrexate (RHEUMATREX) 2.5 MG tablet Take 7.5-10 mg by mouth 2 (two) times a week. Take 4 tablets Tuesday and 3 tablets on Wednesday......Marland Kitchen.Caution:Chemotherapy. Protect from light.    Marland Kitchen. omeprazole (PRILOSEC) 20 MG capsule Take 20 mg by mouth daily.  3   No current facility-administered medications on file prior to visit.     Allergies:   Allergies  Allergen Reactions  . Ibuprofen Itching    Physical Exam General: Obese middle-aged African-American lady, seated, in no evident distress Head: head normocephalic and atraumatic.  Neck: supple with no carotid or supraclavicular bruits Cardiovascular: regular rate and rhythm, no murmurs Musculoskeletal: no deformity Skin:  no rash/petichiae Vascular:  Normal pulses all extremities Vitals:   08/18/17 1549  BP: (!) 141/98  Pulse: 83   Neurologic Exam Mental Status: Awake and fully alert. Oriented to place and time. Recent and  remote memory intact. Attention span, concentration and fund of knowledge appropriate. Mood and affect appropriate.  Cranial Nerves: Fundoscopic exam reveals sharp disc margins. Pupils equal, briskly reactive to light. Extraocular movements full without nystagmus. Visual fields full to confrontation. Hearing intact. Facial sensation intact. Face, tongue, palate moves normally and symmetrically.  Motor: Normal bulk and tone. Normal strength in all tested extremity muscles.  Diminished fine finger movements on the left.  Orbits right to left upper extremity.  Mild left toe extensor weakness Sensory.: intact to touch ,pinprick .position and vibratory sensation.  Coordination: Rapid alternating movements normal in all extremities. Finger-to-nose and heel-to-shin performed accurately bilaterally. Gait and Station: Arises from chair without difficulty. Stance is normal. Gait demonstrates  normal stride length and balance . Able to heel, toe and tandem walk with slight difficulty.  Reflexes: 1+ and symmetric. Toes downgoing.   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  Vascular risk factors of hyperlipidemia , sleep apnoea and obesity only.  History of sarcoidosis which has been stable    PLAN: I had a long d/w patient about her recent cryptogenic stroke, risk for recurrent stroke/TIAs, personally independently reviewed imaging studies and stroke evaluation results and answered questions.Continue aspirin 325 mg daily  for secondary stroke prevention and maintain strict control of hypertension with blood pressure goal below 130/90, diabetes with hemoglobin A1c goal below 6.5% and lipids with LDL cholesterol goal below 70 mg/dL. I also advised the patient to eat a healthy diet with plenty of whole grains, cereals, fruits and vegetables,  exercise regularly and maintain ideal body weight I advised her to use her CPAP for sleep apnea but she is refusing and wants to lose weight.Check follow-up lipid profile and liver function tests followup in the future with my nurse practitioner 6 months or call earlier if necessary. Greater than 50% of time during this 25 minute visit was spent on counseling,explanation of diagnosis of cryptogenic stroke, planning of further management, discussion with patient and family and coordination of care Delia HeadyPramod Vlasta Baskin, MD  [facility] [address] [place], [place] [id number]  Phone [phone] Fax [phone] Note: This document was prepared with digital dictation and possible smart phrase technology. Any transcriptional errors that result from this process are unintentional

## 2017-08-18 NOTE — Patient Instructions (Addendum)
I had a long d/w patient about her recent cryptogenic stroke, risk for recurrent stroke/TIAs, personally independently reviewed imaging studies and stroke evaluation results and answered questions.Continue aspirin 325 mg daily  for secondary stroke prevention and maintain strict control of hypertension with blood pressure goal below 130/90, diabetes with hemoglobin A1c goal below 6.5% and lipids with LDL cholesterol goal below 70 mg/dL. I also advised the patient to eat a healthy diet with plenty of whole grains, cereals, fruits and vegetables, exercise regularly and maintain ideal body weight I advised her to use her CPAP for sleep apnea but she is refusing and wants to lose weight.Check follow-up lipid profile and liver function tests followup in the future with my nurse practitioner 6 months or call earlier if necessary. Stroke Prevention Some medical conditions and behaviors are associated with an increased chance of having a stroke. You may prevent a stroke by making healthy choices and managing medical conditions. How can I reduce my risk of having a stroke?  Stay physically active. Get at least 30 minutes of activity on most or all days.  Do not smoke. It may also be helpful to avoid exposure to secondhand smoke.  Limit alcohol use. Moderate alcohol use is considered to be: ? No more than 2 drinks per day for men. ? No more than 1 drink per day for nonpregnant women.  Eat healthy foods. This involves: ? Eating 5 or more servings of fruits and vegetables a day. ? Making dietary changes that address high blood pressure (hypertension), high cholesterol, diabetes, or obesity.  Manage your cholesterol levels. ? Making food choices that are high in fiber and low in saturated fat, trans fat, and cholesterol may control cholesterol levels. ? Take any prescribed medicines to control cholesterol as directed by your health care provider.  Manage your diabetes. ? Controlling your carbohydrate and  sugar intake is recommended to manage diabetes. ? Take any prescribed medicines to control diabetes as directed by your health care provider.  Control your hypertension. ? Making food choices that are low in salt (sodium), saturated fat, trans fat, and cholesterol is recommended to manage hypertension. ? Ask your health care provider if you need treatment to lower your blood pressure. Take any prescribed medicines to control hypertension as directed by your health care provider. ? If you are 8918-52 years of age, have your blood pressure checked every 3-5 years. If you are 52 years of age or older, have your blood pressure checked every year.  Maintain a healthy weight. ? Reducing calorie intake and making food choices that are low in sodium, saturated fat, trans fat, and cholesterol are recommended to manage weight.  Stop drug abuse.  Avoid taking birth control pills. ? Talk to your health care provider about the risks of taking birth control pills if you are over 52 years old, smoke, get migraines, or have ever had a blood clot.  Get evaluated for sleep disorders (sleep apnea). ? Talk to your health care provider about getting a sleep evaluation if you snore a lot or have excessive sleepiness.  Take medicines only as directed by your health care provider. ? For some people, aspirin or blood thinners (anticoagulants) are helpful in reducing the risk of forming abnormal blood clots that can lead to stroke. If you have the irregular heart rhythm of atrial fibrillation, you should be on a blood thinner unless there is a good reason you cannot take them. ? Understand all your medicine instructions.  Make sure that  other conditions (such as anemia or atherosclerosis) are addressed. Get help right away if:  You have sudden weakness or numbness of the face, arm, or leg, especially on one side of the body.  Your face or eyelid droops to one side.  You have sudden confusion.  You have trouble  speaking (aphasia) or understanding.  You have sudden trouble seeing in one or both eyes.  You have sudden trouble walking.  You have dizziness.  You have a loss of balance or coordination.  You have a sudden, severe headache with no known cause.  You have new chest pain or an irregular heartbeat. Any of these symptoms may represent a serious problem that is an emergency. Do not wait to see if the symptoms will go away. Get medical help at once. Call your local emergency services (911 in [place] Do not drive yourself to the hospital. This information is not intended to replace advice given to you by your health care provider. Make sure you discuss any questions you have with your health care provider. Document Released: 11/04/2004 Document Revised: 03/04/2016 Document Reviewed: 03/30/2013 Elsevier Interactive Patient Education  2017 [employer]

## 2017-08-19 DIAGNOSIS — Z0289 Encounter for other administrative examinations: Secondary | ICD-10-CM

## 2017-08-19 LAB — LIPID PANEL
Chol/HDL Ratio: 2.5 ratio (ref 0.0–4.4)
Cholesterol, Total: 154 mg/dL (ref 100–199)
HDL: 62 mg/dL (ref >39)
LDL Calculated: 76 mg/dL (ref 0–99)
Triglycerides: 78 mg/dL (ref 0–149)
VLDL Cholesterol Cal: 16 mg/dL (ref 5–40)

## 2017-08-19 LAB — HEPATIC FUNCTION PANEL
ALT: 23 IU/L (ref 0–32)
AST: 26 IU/L (ref 0–40)
Albumin: 4.1 g/dL (ref 3.5–5.5)
Alkaline Phosphatase: 109 IU/L (ref 39–117)
Bilirubin Total: 0.4 mg/dL (ref 0.0–1.2)
Bilirubin, Direct: 0.12 mg/dL (ref 0.00–0.40)
Total Protein: 6.7 g/dL (ref 6.0–8.5)

## 2017-08-22 ENCOUNTER — Telehealth: Payer: Self-pay

## 2017-08-22 NOTE — Telephone Encounter (Signed)
Form completed by Dr. Pearlean BrownieSethi. Pt paid 50.00 dollars. Sent to medical records for patient to be call for pick up.

## 2017-08-22 NOTE — Telephone Encounter (Signed)
Called pt, pt health care fitness form ready for pick up. Form @ the front desk.

## 2017-08-29 ENCOUNTER — Telehealth: Payer: Self-pay

## 2017-08-29 NOTE — Telephone Encounter (Signed)
Rn call patient that her liver function test were normal. Bad cholesterol was acceptable,but borderline. It has improve from a month ago. Pt verbalized understanding. ------

## 2017-08-29 NOTE — Telephone Encounter (Signed)
-----   Message from Micki RileyPramod S Sethi, MD sent at 08/26/2017  6:38 PM EST ----- Kindly inform the patient that liver function tests are normal.  Bad cholesterol is borderline but acceptable and improved from 1 month ago

## 2017-09-12 ENCOUNTER — Ambulatory Visit (INDEPENDENT_AMBULATORY_CARE_PROVIDER_SITE_OTHER): Payer: 59 | Admitting: *Deleted

## 2017-09-12 DIAGNOSIS — I639 Cerebral infarction, unspecified: Secondary | ICD-10-CM

## 2017-09-12 NOTE — Progress Notes (Signed)
[facility] Summary Report / Loop Recorder 

## 2017-09-20 LAB — CUP PACEART REMOTE DEVICE CHECK
Date Time Interrogation Session: 20181201204009
Implantable Pulse Generator Implant Date: 20181002

## 2017-10-10 ENCOUNTER — Ambulatory Visit (INDEPENDENT_AMBULATORY_CARE_PROVIDER_SITE_OTHER): Payer: 59 | Admitting: *Deleted

## 2017-10-10 DIAGNOSIS — I639 Cerebral infarction, unspecified: Secondary | ICD-10-CM

## 2017-10-12 NOTE — Progress Notes (Signed)
[facility] Summary Report / Loop Recorder 

## 2017-10-19 DIAGNOSIS — R21 Rash and other nonspecific skin eruption: Secondary | ICD-10-CM | POA: Diagnosis not present

## 2017-10-19 DIAGNOSIS — D869 Sarcoidosis, unspecified: Secondary | ICD-10-CM | POA: Diagnosis not present

## 2017-10-19 DIAGNOSIS — Z79899 Other long term (current) drug therapy: Secondary | ICD-10-CM | POA: Diagnosis not present

## 2017-10-20 LAB — CUP PACEART REMOTE DEVICE CHECK
Date Time Interrogation Session: 20181231203942
Implantable Pulse Generator Implant Date: 20181002

## 2017-11-09 ENCOUNTER — Ambulatory Visit (INDEPENDENT_AMBULATORY_CARE_PROVIDER_SITE_OTHER): Payer: 59 | Admitting: *Deleted

## 2017-11-09 DIAGNOSIS — I639 Cerebral infarction, unspecified: Secondary | ICD-10-CM

## 2017-11-10 NOTE — Progress Notes (Signed)
[facility] Summary Report / Loop Recorder 

## 2017-11-21 DIAGNOSIS — D649 Anemia, unspecified: Secondary | ICD-10-CM | POA: Diagnosis not present

## 2017-11-22 LAB — CUP PACEART REMOTE DEVICE CHECK
Date Time Interrogation Session: 20190130210958
Implantable Pulse Generator Implant Date: 20181002

## 2017-12-12 ENCOUNTER — Ambulatory Visit (INDEPENDENT_AMBULATORY_CARE_PROVIDER_SITE_OTHER): Payer: 59 | Admitting: *Deleted

## 2017-12-12 DIAGNOSIS — I639 Cerebral infarction, unspecified: Secondary | ICD-10-CM

## 2017-12-13 DIAGNOSIS — E041 Nontoxic single thyroid nodule: Secondary | ICD-10-CM | POA: Diagnosis not present

## 2017-12-13 DIAGNOSIS — Z1211 Encounter for screening for malignant neoplasm of colon: Secondary | ICD-10-CM | POA: Diagnosis not present

## 2017-12-13 DIAGNOSIS — Z0000 Encounter for general adult medical examination without abnormal findings: Secondary | ICD-10-CM | POA: Diagnosis not present

## 2017-12-13 DIAGNOSIS — I10 Essential (primary) hypertension: Secondary | ICD-10-CM | POA: Diagnosis not present

## 2017-12-13 DIAGNOSIS — E785 Hyperlipidemia, unspecified: Secondary | ICD-10-CM | POA: Diagnosis not present

## 2017-12-13 NOTE — Progress Notes (Signed)
[facility] Summary Report / Loop Recorder 

## 2018-01-16 ENCOUNTER — Ambulatory Visit (INDEPENDENT_AMBULATORY_CARE_PROVIDER_SITE_OTHER): Payer: 59 | Admitting: *Deleted

## 2018-01-16 DIAGNOSIS — I639 Cerebral infarction, unspecified: Secondary | ICD-10-CM | POA: Diagnosis not present

## 2018-01-16 LAB — CUP PACEART REMOTE DEVICE CHECK
Date Time Interrogation Session: 20190304231204
Implantable Pulse Generator Implant Date: 20181002

## 2018-01-17 ENCOUNTER — Telehealth: Payer: Self-pay

## 2018-01-17 NOTE — Progress Notes (Signed)
[facility] Summary Report / Loop Recorder 

## 2018-01-17 NOTE — Telephone Encounter (Signed)
I called pt, offered her a sooner stroke f/u appt with Shanda BumpsJessica, NP. Pt is agreeable to this, and appt was scheduled for 01/20/18 at 11:00am with Shanda BumpsJessica, NP, and knows to arrive at 10:30am. Pt verbalized understanding of new appt date and time.

## 2018-01-19 DIAGNOSIS — R21 Rash and other nonspecific skin eruption: Secondary | ICD-10-CM | POA: Diagnosis not present

## 2018-01-19 DIAGNOSIS — Z79899 Other long term (current) drug therapy: Secondary | ICD-10-CM | POA: Diagnosis not present

## 2018-01-19 DIAGNOSIS — D869 Sarcoidosis, unspecified: Secondary | ICD-10-CM | POA: Diagnosis not present

## 2018-01-20 ENCOUNTER — Encounter: Payer: Self-pay | Admitting: Adult Health

## 2018-01-20 ENCOUNTER — Ambulatory Visit: Payer: 59 | Admitting: Adult Health

## 2018-01-20 VITALS — BP 144/86 | HR 79 | Ht 66.0 in | Wt 252.0 lb

## 2018-01-20 DIAGNOSIS — H6121 Impacted cerumen, right ear: Secondary | ICD-10-CM

## 2018-01-20 DIAGNOSIS — I10 Essential (primary) hypertension: Secondary | ICD-10-CM

## 2018-01-20 DIAGNOSIS — I639 Cerebral infarction, unspecified: Secondary | ICD-10-CM

## 2018-01-20 DIAGNOSIS — E785 Hyperlipidemia, unspecified: Secondary | ICD-10-CM | POA: Diagnosis not present

## 2018-01-20 NOTE — Patient Instructions (Addendum)
Continue aspirin 325 mg daily  and lipitor  for secondary stroke prevention  Continue to follow up with your PCP regarding cholesterol and blood pressure management  Call PCP regarding ear impaction for wax removal - if they are unable to see you today, you can go to an Urgent Care if unable to see PCP before the weekend and you start experience pain  Continue to monitor loop recorder  Maintain strict control of hypertension with blood pressure goal below 130/90, diabetes with hemoglobin A1c goal below 6.5% and cholesterol with LDL cholesterol (bad cholesterol) goal below 70 mg/dL. I also advised the patient to eat a healthy diet with plenty of whole grains, cereals, fruits and vegetables, exercise regularly and maintain ideal body weight.  Followup in the future with me in 6 months or call earlier if needed

## 2018-01-20 NOTE — Progress Notes (Signed)
[facility] [address] [place]. [place] [address] [phone]       OFFICE FOLLOW-UP NOTE  [name] Date of Birth:  [birth year] Medical Record Number:  [id number]   HPI: Ms Kidd is a 52 year African-American lady seen today for first office follow-up visit following hospital admission for stroke in September 2018.  She is accompanied by sister and history is obtained from them as well as review of electronic medical records.  I personally reviewed imaging films.Bonnie Stokke Smithis an 52 y.o.female with a history of sarcoidosis, depression, anxiety and arthritis who presented with weakness of sudden onset involving left lower extremity, mostly distally, for 3 days. She has not been able to move her ankle and also unable to move her toes. She has not experienced numbness involving left lower extremity. She's had no symptoms involving left upper extremity. There is been no change in speech and no facial droop. She has no history of stroke or TIA  .LSN:06/24/2017.tPA Given:No: beyond time window for treatment consideration.  MRI scan of the brain showed multifocal small infarcts in the right hemisphere at the watershed junctions of the right ACA/MCA and right MCA/PCA.  MRA of the brain was read as normal but upon review showed a focal area of signal loss at the right A2/A3 junction which could be an embolus.  Carotid ultrasound showed no significant extracranial stenosis.  Transthoracic echo showed normal ejection fraction.  Transesophageal echocardiogram showed no cardiac source of embolism or PFO.  LDL cholesterol was 96 mg percent.  Hemoglobin A1c was 5..  Patient was started on aspirin for stroke prevention and Lipitor.  She had a history of sarcoidosis and hence underwent MRI scan of the spine which did not show any compressive lesions in the cervical or thoracic spine.  Lab work for vascullitis showed positive ANA and scleroderma IgG ENA antibody titer was elevated.  Patient  had no symptoms suggestive of a rheumatological disorder.  She was referred to rheumatologist Dr. Zenovia Jordan whom she has seen as an outpatient who evaluated the patient and asked her to continue her methotrexate and Plaquenil for her sarcoidosis.    History 08/18/17: The patient states she has done well she is finished outpatient therapies.  She is regained most of her strength on the left side.  She has only mild left toe extensor weakness.  She is not using any assistive device.  She has known history of sleep apnea but after she underwent weight loss surgery she discontinued using CPAP.  She admits she is gained back about 30-40 pounds.  She wants to lose weight rather than go back on CPAP.  She is tolerating aspirin well without bleeding or bruising.  She is tolerating Lipitor without muscle aches and pains.  She had loop recorder inserted and so far paroxysmal atrial fibrillation has not had been found.   Marland Kitchen Update 01/20/18:Patient returns today for six-month follow-up.  Overall she is doing well from a stroke standpoint.  She continues to take her aspirin without side effects of bleeding or bruising.  Continues to take Lipitor without side effects of myalgias.  Recently had LDL checked to PCP and was satisfactory.  Blood pressure today's visit satisfactory 144/86.  Recent review of loop recorder has not shown atrial fibrillation thus far.  Does have recent complaints of right facial numbness that goes from the ear down into her jaw which has been present for approximately 3 weeks now.  she feels a numbness when she  wakes up in the morning but clears throughout the day.  She describes as a Novocain type of feeling.  Denies pain, denies pain with eating or talking, denies vision changes, denies new medications, denies other stroke symptoms, and denies headaches.  She has not seen a doctor for this complaint.  No new or worsening stroke/TIA symptoms.    ROS:   14 system review of systems is positive  for hearing loss, eye itching, blurred vision and all other systems negative  PMH:  Past Medical History:  Diagnosis Date  . Allergic rhinitis   . Anemia   . Anxiety   . Arthritis   . Asthma       . Depression   . GERD (gastroesophageal reflux disease)   . Goiter   . Hyperlipidemia   . Hypertension    no meds since wt loss  . OSA (obstructive sleep apnea)    cpap- setting at 9, no CPAP since weight loss  . Sarcoidosis    Pulmonary, skin involvement  . Stroke [facility])     Social History:  Social History   Socioeconomic History  . Marital status: Single    Spouse name: Not on file  . Number of children: Not on file  . Years of education: Not on file  . Highest education level: Not on file  Occupational History  . Occupation: Diplomatic Services operational officer  Social Needs  . Financial resource strain: Not on file  . Food insecurity:    Worry: Not on file    Inability: Not on file  . Transportation needs:    Medical: Not on file    Non-medical: Not on file  Tobacco Use  . Smoking status: Former Smoker    Packs/day: 0.50    Years: 12.00    Pack years: 6.00    Last attempt to quit: 10/11/1996    Years since quitting: 21.2  . Smokeless tobacco: Former Engineer, water and Sexual Activity  . Alcohol use: Yes    Comment: occassional  . Drug use: No  . Sexual activity: Not on file  Lifestyle  . Physical activity:    Days per week: Not on file    Minutes per session: Not on file  . Stress: Not on file  Relationships  . Social connections:    Talks on phone: Not on file    Gets together: Not on file    Attends religious service: Not on file    Active member of club or organization: Not on file    Attends meetings of clubs or organizations: Not on file    Relationship status: Not on file  . Intimate partner violence:    Fear of current or ex partner: Not on file    Emotionally abused: Not on file    Physically abused: Not on file    Forced sexual activity: Not on file  Other Topics  Concern  . Not on file  Social History Narrative  . Not on file    Medications:   Current Outpatient Medications on File Prior to Visit  Medication Sig Dispense Refill  . aspirin 325 MG tablet Take 1 tablet (325 mg total) by mouth daily. 30 tablet 0  . atorvastatin (LIPITOR) 80 MG tablet Take 1 tablet (80 mg total) by mouth daily at 6 PM. 30 tablet 0  . buPROPion (WELLBUTRIN XL) 300 MG 24 hr tablet Take 300 mg by mouth daily.    Marland Kitchen escitalopram (LEXAPRO) 20 MG tablet Take 20 mg by mouth  daily.    . folic acid (FOLVITE) 400 MCG tablet Take 400 mcg by mouth daily.    . hydroxychloroquine (PLAQUENIL) 200 MG tablet Take 400 mg by mouth daily.    . methotrexate (RHEUMATREX) 2.5 MG tablet Take 7.5-10 mg by mouth 2 (two) times a week. Take 4 tablets Tuesday and 3 tablets on Wednesday......Marland Kitchen.Caution:Chemotherapy. Protect from light.    Marland Kitchen. omeprazole (PRILOSEC) 20 MG capsule Take 20 mg by mouth daily.  3   No current facility-administered medications on file prior to visit.     Allergies:   Allergies  Allergen Reactions  . Ibuprofen Itching    Physical Exam General: Obese middle-aged African-American lady, seated, in no evident distress Ears: Complete cerumen impaction right ear; mild impaction left ear Head: head normocephalic and atraumatic.  Neck: supple with no carotid or supraclavicular bruits Cardiovascular: regular rate and rhythm, no murmurs Musculoskeletal: no deformity Skin:  no rash/petichiae Vascular:  Normal pulses all extremities Vitals:   01/20/18 1110  BP: (!) 144/86  Pulse: 79   Neurologic Exam Mental Status: Awake and fully alert. Oriented to place and time. Recent and remote memory intact. Attention span, concentration and fund of knowledge appropriate. Mood and affect appropriate.  Cranial Nerves: Fundoscopic exam reveals sharp disc margins. Pupils equal, briskly reactive to light. Extraocular movements full without nystagmus. Visual fields full to confrontation.  Decreased hearing in right ear compared to left. Facial sensation intact. Face, tongue, palate moves normally and symmetrically.  Motor: Normal bulk and tone. Normal strength in all tested extremity muscles.  Diminished fine finger movements on the left.  Orbits right to left upper extremity.  Mild left toe extensor weakness Sensory.: intact to touch ,pinprick .position and vibratory sensation.  Coordination: Rapid alternating movements normal in all extremities. Finger-to-nose and heel-to-shin performed accurately bilaterally. Gait and Station: Arises from chair without difficulty. Stance is normal. Gait demonstrates normal stride length and balance . Able to heel, toe and tandem walk with slight difficulty.  Reflexes: 1+ and symmetric. Toes downgoing.     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  Vascular risk factors of hyperlipidemia , sleep apnoea and obesity only.  History of sarcoidosis which has been stable.  Patient returns today for follow-up and overall is doing well from a stroke standpoint but does have a new complaint of decreased hearing and mild right sided numbness of her face.    PLAN: -continue aspirin 325 daily and lipitor for secondary stroke prevention -f/u with PCP for HTN and HLD management -Patient will call PCP in regards to wax impaction as this could be the reason behind recent hearing loss -Patient wears a headset during work and typically uses on the right side which could have led to increased impaction and increased sensitivity on right side of face.  Patient will start to rotate this every couple months to prevent this from happening again. -Continue to monitor loop recorder -Maintain strict control of hypertension with blood pressure goal below 130/90, diabetes with hemoglobin A1c goal below 6.5% and cholesterol with LDL  cholesterol (bad cholesterol) goal below 70 mg/dL. I also advised the patient to eat a healthy diet with plenty of whole grains, cereals, fruits and vegetables, exercise regularly and maintain ideal body weight.  Followup in the future with me in 6 months or call earlier if needed  Greater than 50% time during this 25 minute consultation visit  was spent on counseling and coordination of care about HTN, and HLD (risk factors), discussion about risk benefit of anticoagulation and answering questions.   George Hugh, AGNP-BC  [facility] [address] [place], [place] [address]  Phone [phone] Fax [phone]

## 2018-01-23 DIAGNOSIS — H6121 Impacted cerumen, right ear: Secondary | ICD-10-CM | POA: Diagnosis not present

## 2018-01-23 DIAGNOSIS — H9191 Unspecified hearing loss, right ear: Secondary | ICD-10-CM | POA: Diagnosis not present

## 2018-01-27 NOTE — Progress Notes (Signed)
I agree with the above plan 

## 2018-02-15 ENCOUNTER — Ambulatory Visit: Payer: 59 | Admitting: Nurse Practitioner

## 2018-02-16 ENCOUNTER — Ambulatory Visit (INDEPENDENT_AMBULATORY_CARE_PROVIDER_SITE_OTHER): Payer: 59 | Admitting: *Deleted

## 2018-02-16 DIAGNOSIS — I639 Cerebral infarction, unspecified: Secondary | ICD-10-CM

## 2018-02-17 LAB — CUP PACEART REMOTE DEVICE CHECK
Date Time Interrogation Session: 20190406234119
Implantable Pulse Generator Implant Date: 20181002

## 2018-02-20 NOTE — Progress Notes (Signed)
[facility] Summary Report / Loop Recorder 

## 2018-03-14 LAB — CUP PACEART REMOTE DEVICE CHECK
Date Time Interrogation Session: 20190510000927
Implantable Pulse Generator Implant Date: 20181002

## 2018-03-16 DIAGNOSIS — Z1231 Encounter for screening mammogram for malignant neoplasm of breast: Secondary | ICD-10-CM | POA: Diagnosis not present

## 2018-03-21 ENCOUNTER — Ambulatory Visit (INDEPENDENT_AMBULATORY_CARE_PROVIDER_SITE_OTHER): Payer: 59 | Admitting: *Deleted

## 2018-03-21 DIAGNOSIS — I639 Cerebral infarction, unspecified: Secondary | ICD-10-CM

## 2018-03-22 NOTE — Progress Notes (Signed)
[facility] Summary Report / Loop Recorder 

## 2018-04-24 ENCOUNTER — Ambulatory Visit (INDEPENDENT_AMBULATORY_CARE_PROVIDER_SITE_OTHER): Payer: 59 | Admitting: *Deleted

## 2018-04-24 DIAGNOSIS — I639 Cerebral infarction, unspecified: Secondary | ICD-10-CM | POA: Diagnosis not present

## 2018-04-25 NOTE — Progress Notes (Signed)
[facility] Summary Report / Loop Recorder 

## 2018-04-27 DIAGNOSIS — M2550 Pain in unspecified joint: Secondary | ICD-10-CM | POA: Diagnosis not present

## 2018-04-27 DIAGNOSIS — R5383 Other fatigue: Secondary | ICD-10-CM | POA: Diagnosis not present

## 2018-04-27 DIAGNOSIS — D869 Sarcoidosis, unspecified: Secondary | ICD-10-CM | POA: Diagnosis not present

## 2018-04-27 DIAGNOSIS — R21 Rash and other nonspecific skin eruption: Secondary | ICD-10-CM | POA: Diagnosis not present

## 2018-04-27 LAB — CUP PACEART REMOTE DEVICE CHECK
Date Time Interrogation Session: 20190612003540
Implantable Pulse Generator Implant Date: 20181002

## 2018-05-26 ENCOUNTER — Ambulatory Visit (INDEPENDENT_AMBULATORY_CARE_PROVIDER_SITE_OTHER): Payer: 59 | Admitting: *Deleted

## 2018-05-26 DIAGNOSIS — I639 Cerebral infarction, unspecified: Secondary | ICD-10-CM

## 2018-05-29 DIAGNOSIS — D649 Anemia, unspecified: Secondary | ICD-10-CM | POA: Diagnosis not present

## 2018-05-29 NOTE — Progress Notes (Signed)
[facility] Summary Report / Loop Recorder 

## 2018-06-06 LAB — CUP PACEART REMOTE DEVICE CHECK
Date Time Interrogation Session: 20190715003515
Implantable Pulse Generator Implant Date: 20181002

## 2018-06-28 ENCOUNTER — Ambulatory Visit (INDEPENDENT_AMBULATORY_CARE_PROVIDER_SITE_OTHER): Payer: 59 | Admitting: *Deleted

## 2018-06-28 DIAGNOSIS — I639 Cerebral infarction, unspecified: Secondary | ICD-10-CM | POA: Diagnosis not present

## 2018-06-29 NOTE — Progress Notes (Signed)
[facility] Summary Report / Loop Recorder 

## 2018-07-03 LAB — CUP PACEART REMOTE DEVICE CHECK
Date Time Interrogation Session: 20190817003754
Implantable Pulse Generator Implant Date: 20181002

## 2018-07-10 LAB — CUP PACEART REMOTE DEVICE CHECK
Date Time Interrogation Session: 20190919013532
Implantable Pulse Generator Implant Date: 20181002

## 2018-07-24 ENCOUNTER — Ambulatory Visit: Payer: 59 | Admitting: Adult Health

## 2018-07-24 ENCOUNTER — Encounter: Payer: Self-pay | Admitting: Adult Health

## 2018-07-24 VITALS — BP 137/82 | HR 88 | Ht 66.0 in | Wt 258.2 lb

## 2018-07-24 DIAGNOSIS — Z9889 Other specified postprocedural states: Secondary | ICD-10-CM

## 2018-07-24 DIAGNOSIS — I639 Cerebral infarction, unspecified: Secondary | ICD-10-CM

## 2018-07-24 DIAGNOSIS — I10 Essential (primary) hypertension: Secondary | ICD-10-CM

## 2018-07-24 DIAGNOSIS — E785 Hyperlipidemia, unspecified: Secondary | ICD-10-CM

## 2018-07-24 NOTE — Progress Notes (Signed)
I agree with the above plan 

## 2018-07-24 NOTE — Progress Notes (Signed)
[facility] [address] [place]. [place] [address] [phone]       OFFICE FOLLOW-UP NOTE  Bonnie. CICLEY Kidd Date of Birth:  [birth year] Medical Record Number:  [id number]   HPI: Bonnie Tschida is a 52 year African-American lady seen today for first office follow-up visit following hospital admission for stroke in September 2018.  She is accompanied by sister and history is obtained from them as well as review of electronic medical records.  I personally reviewed imaging films.Bonnie Gaulin Smithis an 52 y.o.female with a history of sarcoidosis, depression, anxiety and arthritis who presented with weakness of sudden onset involving left lower extremity, mostly distally, for 3 days. She has not been able to move her ankle and also unable to move her toes. She has not experienced numbness involving left lower extremity. She's had no symptoms involving left upper extremity. There is been no change in speech and no facial droop. She has no history of stroke or TIA  .LSN:06/24/2017.tPA Given:No: beyond time window for treatment consideration.  MRI scan of the brain showed multifocal small infarcts in the right hemisphere at the watershed junctions of the right ACA/MCA and right MCA/PCA.  MRA of the brain was read as normal but upon review showed a focal area of signal loss at the right A2/A3 junction which could be an embolus.  Carotid ultrasound showed no significant extracranial stenosis.  Transthoracic echo showed normal ejection fraction.  Transesophageal echocardiogram showed no cardiac source of embolism or PFO.  LDL cholesterol was 96 mg percent.  Hemoglobin A1c was 5..  Patient was started on aspirin for stroke prevention and Lipitor.  She had a history of sarcoidosis and hence underwent MRI scan of the spine which did not show any compressive lesions in the cervical or thoracic spine.  Lab work for vascullitis showed positive ANA and scleroderma IgG ENA antibody titer was elevated.  Patient  had no symptoms suggestive of a rheumatological disorder.  She was referred to rheumatologist Dr. Zenovia Jordan whom she has seen as an outpatient who evaluated the patient and asked her to continue her methotrexate and Plaquenil for her sarcoidosis.    08/18/17 visit PS: The patient states she has done well she is finished outpatient therapies.  She is regained most of her strength on the left side.  She has only mild left toe extensor weakness.  She is not using any assistive device.  She has known history of sleep apnea but after she underwent weight loss surgery she discontinued using CPAP.  She admits she is gained back about 30-40 pounds.  She wants to lose weight rather than go back on CPAP.  She is tolerating aspirin well without bleeding or bruising.  She is tolerating Lipitor without muscle aches and pains.  She had loop recorder inserted and so far paroxysmal atrial fibrillation has not had been found.   . 01/20/18 visit:Patient returns today for six-month follow-up.  Overall she is doing well from a stroke standpoint.  She continues to take her aspirin without side effects of bleeding or bruising.  Continues to take Lipitor without side effects of myalgias.  Recently had LDL checked to PCP and was satisfactory.  Blood pressure today's visit satisfactory 144/86.  Recent review of loop recorder has not shown atrial fibrillation thus far.  Does have recent complaints of right facial numbness that goes from the ear down into her jaw which has been present for approximately 3 weeks now.  she feels a numbness when  she wakes up in the morning but clears throughout the day.  She describes as a Novocain type of feeling.  Denies pain, denies pain with eating or talking, denies vision changes, denies new medications, denies other stroke symptoms, and denies headaches.  She has not seen a doctor for this complaint.  No new or worsening stroke/TIA symptoms.  Interval history 07/24/2018: Patient returns today  for follow-up visit.  Overall she continues to do well without residual deficits or recurring of symptoms.  At prior appointment, patient had complaints of right facial numbness and was found to have wax impaction.  She did follow with her PCP who remove this wax and since this time, denies any additional facial numbness.  Loop recorder has not shown atrial fibrillation thus far.  Continues aspirin 325 mg without side effects of bleeding or bruising.  Continues with Lipitor 80 mg without side effects of myalgias.  Blood pressure today satisfactory 137/82.  Patient does have a prior history of OSA diagnosed in 2015 with use of CPAP for management.  She states after bariatric surgery, patient lost approximately 112 pounds and patient requested to stop use of CPAP as her sleep and breathing have improved.  She was followed by Dr. Craige Cotta at [facility] pulmonology with last appointment on 09/03/2014.  She does state that she did not see additional benefit with use of CPAP as she continued to have excessive daytime fatigue despite sleeping well at night and compliance with CPAP.  She does state since this time she has gained approximately 60 pounds back and continues to experience excessive daytime fatigue despite obtaining adequate sleep at night.  No further concerns at this time.  Patient continues to stay active and working full-time at [employer].  Denies new or worsening stroke/TIA symptoms.   ROS:   14 system review of systems is positive for fatigue, runny nose, eye discharge, eye itching and all other systems negative  PMH:  Past Medical History:  Diagnosis Date  . Allergic rhinitis   . Anemia   . Anxiety   . Arthritis   . Asthma       . Depression   . GERD (gastroesophageal reflux disease)   . Goiter   . Hyperlipidemia   . Hypertension    no meds since wt loss  . OSA (obstructive sleep apnea)    cpap- setting at 9, no CPAP since weight loss  . Sarcoidosis    Pulmonary, skin  involvement  . Stroke [facility])     Social History:  Social History   Socioeconomic History  . Marital status: Single    Spouse name: Not on file  . Number of children: Not on file  . Years of education: Not on file  . Highest education level: Not on file  Occupational History  . Occupation: Diplomatic Services operational officer  Social Needs  . Financial resource strain: Not on file  . Food insecurity:    Worry: Not on file    Inability: Not on file  . Transportation needs:    Medical: Not on file    Non-medical: Not on file  Tobacco Use  . Smoking status: Former Smoker    Packs/day: 0.50    Years: 12.00    Pack years: 6.00    Last attempt to quit: 10/11/1996    Years since quitting: 21.7  . Smokeless tobacco: Former Engineer, water and Sexual Activity  . Alcohol use: Yes    Comment: occassional  . Drug use: No  . Sexual  activity: Not on file  Lifestyle  . Physical activity:    Days per week: Not on file    Minutes per session: Not on file  . Stress: Not on file  Relationships  . Social connections:    Talks on phone: Not on file    Gets together: Not on file    Attends religious service: Not on file    Active member of club or organization: Not on file    Attends meetings of clubs or organizations: Not on file    Relationship status: Not on file  . Intimate partner violence:    Fear of current or ex partner: Not on file    Emotionally abused: Not on file    Physically abused: Not on file    Forced sexual activity: Not on file  Other Topics Concern  . Not on file  Social History Narrative  . Not on file    Medications:   Current Outpatient Medications on File Prior to Visit  Medication Sig Dispense Refill  . aspirin 325 MG tablet Take 1 tablet (325 mg total) by mouth daily. 30 tablet 0  . atorvastatin (LIPITOR) 80 MG tablet Take 1 tablet (80 mg total) by mouth daily at 6 PM. 30 tablet 0  . buPROPion (WELLBUTRIN XL) 300 MG 24 hr tablet Take 300 mg by mouth daily.    Marland Kitchen escitalopram  (LEXAPRO) 20 MG tablet Take 20 mg by mouth daily.    . folic acid (FOLVITE) 1 MG tablet     . hydroxychloroquine (PLAQUENIL) 200 MG tablet Take 400 mg by mouth daily.    . methotrexate (RHEUMATREX) 2.5 MG tablet Take 7.5-10 mg by mouth 2 (two) times a week. Take 4 tablets Tuesday and 3 tablets on Wednesday......Marland KitchenCaution:Chemotherapy. Protect from light.    Marland Kitchen omeprazole (PRILOSEC) 20 MG capsule Take 20 mg by mouth daily.  3   No current facility-administered medications on file prior to visit.     Allergies:   Allergies  Allergen Reactions  . Ibuprofen Itching    Physical Exam General: Obese middle-aged pleasant African-American lady, seated, in no evident distress Ears: Complete cerumen impaction right ear; mild impaction left ear Head: head normocephalic and atraumatic.  Neck: supple with no carotid or supraclavicular bruits Cardiovascular: regular rate and rhythm, no murmurs Musculoskeletal: no deformity Skin:  no rash/petichiae Vascular:  Normal pulses all extremities Vitals:   07/24/18 0902  BP: 137/82  Pulse: 88   Neurologic Exam Mental Status: Awake and fully alert. Oriented to place and time. Recent and remote memory intact. Attention span, concentration and fund of knowledge appropriate. Mood and affect appropriate.  Cranial Nerves: Fundoscopic exam deferred.  Pupils equal, briskly reactive to light. Extraocular movements full without nystagmus. Visual fields full to confrontation.  Hearing equal bilaterally.  Facial sensation intact. Face, tongue, palate moves normally and symmetrically.  Motor: Normal bulk and tone. Normal strength in all tested extremity muscles.  Diminished fine finger movements on the left.  Orbits right to left upper extremity.  Mild left toe extensor weakness Sensory.: intact to touch ,pinprick .position and vibratory sensation.  Coordination: Rapid alternating movements normal in all extremities. Finger-to-nose and heel-to-shin performed accurately  bilaterally. Gait and Station: Arises from chair without difficulty. Stance is normal. Gait demonstrates normal stride length and balance . Able to heel, toe and tandem walk with slight difficulty.  Reflexes: 1+ and symmetric. Toes downgoing.     ASSESSMENT: 35 year African-American lady with multiple small right hemispheric watershed  looking infarcts of cryptogenic etiology who is doing very well with near complete recovery.  Vascular risk factors of hyperlipidemia , sleep apnea and obesity only.  History of sarcoidosis which has been stable.  Patient is being seen today for follow-up appointment and has been stable from a stroke standpoint without residual deficits or recurring symptoms.    PLAN: -continue aspirin 325 daily and lipitor 80 mg for secondary stroke prevention -f/u with PCP for HTN and HLD management -Recommended referral to [facility] for prior history of OSA with possible need of repeat sleep study along with possible underlying sleep disorder (most recent sleep study 2015).  Patient would like to hold off at this time and will call if she wishes referral to be placed.  Also recommended that she could call Dr. Craige Cotta to schedule appointment as she has seen him previously.   -Continue to monitor loop recorder for atrial fibrillation -Highly encouraged increase activity level and ensure she is maintaining a healthy diet -Maintain strict control of hypertension with blood pressure goal below 130/90, diabetes with hemoglobin A1c goal below 6.5% and cholesterol with LDL cholesterol (bad cholesterol) goal below 70 mg/dL. I also advised the patient to eat a healthy diet with plenty of whole grains, cereals, fruits and vegetables, exercise regularly and maintain ideal body weight.  Followup as needed as stable from stroke standpoint and advised to call with questions, concerns or need of future follow-up appointment  Greater than 50% time during this 25 minute consultation visit was  spent on counseling and coordination of care about HTN, and HLD (risk factors), discussion about risk benefit of anticoagulation and answering questions.   George Hugh, AGNP-BC  [facility] [address] [place], [place] [address]  Phone [phone] Fax [phone]

## 2018-07-24 NOTE — Patient Instructions (Addendum)
Continue aspirin 325 mg daily  and lipitor 80mg   for secondary stroke prevention  Continue to follow up with PCP regarding cholesterol and blood presure management/monitoring  Consider referral to our [facility] sleep clinic for OSA workup along with continued daytime fatigue or follow up with Dr. Craige Cotta  We will continue to monitor loop recorder for atrial fibrillation  Continue to stay active and maintain a healthy diet  Maintain strict control of hypertension with blood pressure goal below 130/90, diabetes with hemoglobin A1c goal below 6.5% and cholesterol with LDL cholesterol (bad cholesterol) goal below 70 mg/dL. I also advised the patient to eat a healthy diet with plenty of whole grains, cereals, fruits and vegetables, exercise regularly and maintain ideal body weight.  Followup in the future with me as needed or call earlier if needed       Thank you for coming to see [place] at [facility]. I hope we have been able to provide you high quality care today.  You may receive a patient satisfaction survey over the next few weeks. We would appreciate your feedback and comments so that we may continue to improve ourselves and the health of our patients.

## 2018-07-31 ENCOUNTER — Ambulatory Visit (INDEPENDENT_AMBULATORY_CARE_PROVIDER_SITE_OTHER): Payer: 59 | Admitting: *Deleted

## 2018-07-31 DIAGNOSIS — I639 Cerebral infarction, unspecified: Secondary | ICD-10-CM

## 2018-08-01 NOTE — Progress Notes (Signed)
[facility] Summary Report / Loop Recorder 

## 2018-08-10 DIAGNOSIS — R21 Rash and other nonspecific skin eruption: Secondary | ICD-10-CM | POA: Diagnosis not present

## 2018-08-10 DIAGNOSIS — M2550 Pain in unspecified joint: Secondary | ICD-10-CM | POA: Diagnosis not present

## 2018-08-10 DIAGNOSIS — D869 Sarcoidosis, unspecified: Secondary | ICD-10-CM | POA: Diagnosis not present

## 2018-08-18 LAB — CUP PACEART REMOTE DEVICE CHECK
Date Time Interrogation Session: 20191022020938
Implantable Pulse Generator Implant Date: 20181002

## 2018-09-04 ENCOUNTER — Ambulatory Visit (INDEPENDENT_AMBULATORY_CARE_PROVIDER_SITE_OTHER): Payer: 59

## 2018-09-04 DIAGNOSIS — I639 Cerebral infarction, unspecified: Secondary | ICD-10-CM

## 2018-09-04 NOTE — Progress Notes (Signed)
[facility] Summary Report / Loop Recorder 

## 2018-10-05 ENCOUNTER — Ambulatory Visit: Payer: 59

## 2018-10-06 LAB — CUP PACEART REMOTE DEVICE CHECK
Date Time Interrogation Session: 20191227031016
Implantable Pulse Generator Implant Date: 20181002

## 2018-10-22 LAB — CUP PACEART REMOTE DEVICE CHECK
Date Time Interrogation Session: 20191124024138
Implantable Pulse Generator Implant Date: 20181002

## 2018-11-07 ENCOUNTER — Ambulatory Visit (INDEPENDENT_AMBULATORY_CARE_PROVIDER_SITE_OTHER): Payer: 59

## 2018-11-07 DIAGNOSIS — I639 Cerebral infarction, unspecified: Secondary | ICD-10-CM | POA: Diagnosis not present

## 2018-11-07 DIAGNOSIS — M2550 Pain in unspecified joint: Secondary | ICD-10-CM | POA: Diagnosis not present

## 2018-11-07 DIAGNOSIS — R21 Rash and other nonspecific skin eruption: Secondary | ICD-10-CM | POA: Diagnosis not present

## 2018-11-07 DIAGNOSIS — Z79899 Other long term (current) drug therapy: Secondary | ICD-10-CM | POA: Diagnosis not present

## 2018-11-07 DIAGNOSIS — D869 Sarcoidosis, unspecified: Secondary | ICD-10-CM | POA: Diagnosis not present

## 2018-11-08 NOTE — Progress Notes (Signed)
[facility] Summary Report / Loop Recorder 

## 2018-11-10 LAB — CUP PACEART REMOTE DEVICE CHECK
Date Time Interrogation Session: 20200129030546
Implantable Pulse Generator Implant Date: 20181002

## 2018-12-07 DIAGNOSIS — D869 Sarcoidosis, unspecified: Secondary | ICD-10-CM | POA: Diagnosis not present

## 2018-12-11 ENCOUNTER — Ambulatory Visit (INDEPENDENT_AMBULATORY_CARE_PROVIDER_SITE_OTHER): Payer: 59 | Admitting: *Deleted

## 2018-12-11 DIAGNOSIS — I639 Cerebral infarction, unspecified: Secondary | ICD-10-CM | POA: Diagnosis not present

## 2018-12-11 LAB — CUP PACEART REMOTE DEVICE CHECK
Date Time Interrogation Session: 20200302071236
Implantable Pulse Generator Implant Date: 20181002

## 2018-12-18 NOTE — Progress Notes (Signed)
[facility] Summary Report / Loop Recorder 

## 2019-01-08 DIAGNOSIS — Z0000 Encounter for general adult medical examination without abnormal findings: Secondary | ICD-10-CM | POA: Diagnosis not present

## 2019-01-08 DIAGNOSIS — Z1211 Encounter for screening for malignant neoplasm of colon: Secondary | ICD-10-CM | POA: Diagnosis not present

## 2019-01-08 DIAGNOSIS — E785 Hyperlipidemia, unspecified: Secondary | ICD-10-CM | POA: Diagnosis not present

## 2019-01-15 ENCOUNTER — Ambulatory Visit (INDEPENDENT_AMBULATORY_CARE_PROVIDER_SITE_OTHER): Payer: 59 | Admitting: *Deleted

## 2019-01-15 ENCOUNTER — Other Ambulatory Visit: Payer: Self-pay

## 2019-01-15 DIAGNOSIS — I639 Cerebral infarction, unspecified: Secondary | ICD-10-CM | POA: Diagnosis not present

## 2019-01-16 LAB — CUP PACEART REMOTE DEVICE CHECK
Date Time Interrogation Session: 20200404094256
Implantable Pulse Generator Implant Date: 20181002

## 2019-01-23 NOTE — Progress Notes (Signed)
[facility] Summary Report / Loop Recorder 

## 2019-02-12 DIAGNOSIS — M2550 Pain in unspecified joint: Secondary | ICD-10-CM | POA: Diagnosis not present

## 2019-02-12 DIAGNOSIS — R21 Rash and other nonspecific skin eruption: Secondary | ICD-10-CM | POA: Diagnosis not present

## 2019-02-12 DIAGNOSIS — D869 Sarcoidosis, unspecified: Secondary | ICD-10-CM | POA: Diagnosis not present

## 2019-02-15 ENCOUNTER — Other Ambulatory Visit: Payer: Self-pay

## 2019-02-15 ENCOUNTER — Ambulatory Visit (INDEPENDENT_AMBULATORY_CARE_PROVIDER_SITE_OTHER): Payer: 59 | Admitting: *Deleted

## 2019-02-15 DIAGNOSIS — I639 Cerebral infarction, unspecified: Secondary | ICD-10-CM | POA: Diagnosis not present

## 2019-02-15 DIAGNOSIS — D869 Sarcoidosis, unspecified: Secondary | ICD-10-CM | POA: Diagnosis not present

## 2019-02-15 LAB — CUP PACEART REMOTE DEVICE CHECK
Date Time Interrogation Session: 20200507100521
Implantable Pulse Generator Implant Date: 20181002

## 2019-02-20 NOTE — Progress Notes (Signed)
[facility] Summary Report / Loop Recorder 

## 2019-03-20 ENCOUNTER — Ambulatory Visit (INDEPENDENT_AMBULATORY_CARE_PROVIDER_SITE_OTHER): Payer: 59 | Admitting: *Deleted

## 2019-03-20 DIAGNOSIS — I639 Cerebral infarction, unspecified: Secondary | ICD-10-CM

## 2019-03-20 LAB — CUP PACEART REMOTE DEVICE CHECK
Date Time Interrogation Session: 20200609120738
Implantable Pulse Generator Implant Date: 20181002

## 2019-03-29 NOTE — Progress Notes (Signed)
[facility] Summary Report / Loop Recorder 

## 2019-04-23 ENCOUNTER — Ambulatory Visit (INDEPENDENT_AMBULATORY_CARE_PROVIDER_SITE_OTHER): Payer: 59 | Admitting: *Deleted

## 2019-04-23 DIAGNOSIS — R55 Syncope and collapse: Secondary | ICD-10-CM | POA: Diagnosis not present

## 2019-04-23 LAB — CUP PACEART REMOTE DEVICE CHECK
Date Time Interrogation Session: 20200712123815
Implantable Pulse Generator Implant Date: 20181002

## 2019-05-07 NOTE — Progress Notes (Signed)
[facility] Summary Report / Loop Recorder 

## 2019-05-25 ENCOUNTER — Ambulatory Visit (INDEPENDENT_AMBULATORY_CARE_PROVIDER_SITE_OTHER): Payer: 59 | Admitting: *Deleted

## 2019-05-25 DIAGNOSIS — I639 Cerebral infarction, unspecified: Secondary | ICD-10-CM | POA: Diagnosis not present

## 2019-05-25 LAB — CUP PACEART REMOTE DEVICE CHECK
Date Time Interrogation Session: 20200814114626
Implantable Pulse Generator Implant Date: 20181002

## 2019-06-04 NOTE — Progress Notes (Signed)
[facility] Summary Report / Loop Recorder 

## 2019-06-27 ENCOUNTER — Ambulatory Visit (INDEPENDENT_AMBULATORY_CARE_PROVIDER_SITE_OTHER): Payer: 59 | Admitting: *Deleted

## 2019-06-27 DIAGNOSIS — I639 Cerebral infarction, unspecified: Secondary | ICD-10-CM | POA: Diagnosis not present

## 2019-06-27 LAB — CUP PACEART REMOTE DEVICE CHECK
Date Time Interrogation Session: 20200916124637
Implantable Pulse Generator Implant Date: 20181002

## 2019-07-03 NOTE — Progress Notes (Signed)
[facility] Summary Report / Loop Recorder 

## 2019-07-30 ENCOUNTER — Ambulatory Visit (INDEPENDENT_AMBULATORY_CARE_PROVIDER_SITE_OTHER): Payer: 59 | Admitting: *Deleted

## 2019-07-30 DIAGNOSIS — I639 Cerebral infarction, unspecified: Secondary | ICD-10-CM | POA: Diagnosis not present

## 2019-07-31 LAB — CUP PACEART REMOTE DEVICE CHECK
Date Time Interrogation Session: 20201019142326
Implantable Pulse Generator Implant Date: 20181002

## 2019-08-20 NOTE — Progress Notes (Signed)
[facility] Summary Report / Loop Recorder 

## 2019-09-02 LAB — CUP PACEART REMOTE DEVICE CHECK
Date Time Interrogation Session: 20201121101215
Implantable Pulse Generator Implant Date: 20181002

## 2019-09-03 ENCOUNTER — Ambulatory Visit (INDEPENDENT_AMBULATORY_CARE_PROVIDER_SITE_OTHER): Payer: 59 | Admitting: *Deleted

## 2019-09-03 DIAGNOSIS — I639 Cerebral infarction, unspecified: Secondary | ICD-10-CM

## 2019-10-04 ENCOUNTER — Ambulatory Visit (INDEPENDENT_AMBULATORY_CARE_PROVIDER_SITE_OTHER): Payer: 59 | Admitting: *Deleted

## 2019-10-04 DIAGNOSIS — I639 Cerebral infarction, unspecified: Secondary | ICD-10-CM

## 2019-10-04 LAB — CUP PACEART REMOTE DEVICE CHECK
Date Time Interrogation Session: 20201224100829
Implantable Pulse Generator Implant Date: 20181002

## 2019-10-07 NOTE — Progress Notes (Signed)
ILR remote 

## 2019-11-05 ENCOUNTER — Ambulatory Visit (INDEPENDENT_AMBULATORY_CARE_PROVIDER_SITE_OTHER): Payer: 59 | Admitting: *Deleted

## 2019-11-05 DIAGNOSIS — I639 Cerebral infarction, unspecified: Secondary | ICD-10-CM | POA: Diagnosis not present

## 2019-11-05 LAB — CUP PACEART REMOTE DEVICE CHECK
Date Time Interrogation Session: 20210124232229
Implantable Pulse Generator Implant Date: 20181002

## 2019-12-06 ENCOUNTER — Ambulatory Visit (INDEPENDENT_AMBULATORY_CARE_PROVIDER_SITE_OTHER): Payer: 59 | Admitting: *Deleted

## 2019-12-06 DIAGNOSIS — I639 Cerebral infarction, unspecified: Secondary | ICD-10-CM

## 2019-12-06 LAB — CUP PACEART REMOTE DEVICE CHECK
Date Time Interrogation Session: 20210225000210
Implantable Pulse Generator Implant Date: 20181002

## 2019-12-07 NOTE — Progress Notes (Signed)
ILR Remote 

## 2020-01-07 ENCOUNTER — Ambulatory Visit (INDEPENDENT_AMBULATORY_CARE_PROVIDER_SITE_OTHER): Payer: 59 | Admitting: *Deleted

## 2020-01-07 DIAGNOSIS — I639 Cerebral infarction, unspecified: Secondary | ICD-10-CM

## 2020-01-07 LAB — CUP PACEART REMOTE DEVICE CHECK
Date Time Interrogation Session: 20210328023134
Implantable Pulse Generator Implant Date: 20181002

## 2020-01-08 NOTE — Progress Notes (Signed)
ILR Remote 

## 2020-02-07 LAB — CUP PACEART REMOTE DEVICE CHECK
Date Time Interrogation Session: 20210428230245
Implantable Pulse Generator Implant Date: 20181002

## 2020-02-11 ENCOUNTER — Ambulatory Visit (INDEPENDENT_AMBULATORY_CARE_PROVIDER_SITE_OTHER): Payer: 59 | Admitting: *Deleted

## 2020-02-11 DIAGNOSIS — I639 Cerebral infarction, unspecified: Secondary | ICD-10-CM

## 2020-02-12 NOTE — Progress Notes (Signed)
[facility] Summary Report / Loop Recorder 

## 2020-03-11 ENCOUNTER — Ambulatory Visit (INDEPENDENT_AMBULATORY_CARE_PROVIDER_SITE_OTHER): Payer: 59 | Admitting: *Deleted

## 2020-03-11 DIAGNOSIS — I639 Cerebral infarction, unspecified: Secondary | ICD-10-CM

## 2020-03-11 NOTE — Progress Notes (Signed)
[facility] Summary Report / Loop Recorder 

## 2020-03-14 LAB — CUP PACEART REMOTE DEVICE CHECK
Date Time Interrogation Session: 20210529232220
Implantable Pulse Generator Implant Date: 20181002

## 2020-03-14 NOTE — Addendum Note (Signed)
Addended by: Geralyn Flash D on: 03/14/2020 09:44 AM   Modules accepted: Level of Service

## 2020-04-11 ENCOUNTER — Ambulatory Visit (INDEPENDENT_AMBULATORY_CARE_PROVIDER_SITE_OTHER): Payer: 59 | Admitting: *Deleted

## 2020-04-11 DIAGNOSIS — I639 Cerebral infarction, unspecified: Secondary | ICD-10-CM

## 2020-04-11 LAB — CUP PACEART REMOTE DEVICE CHECK
Date Time Interrogation Session: 20210701230954
Implantable Pulse Generator Implant Date: 20181002

## 2020-04-15 NOTE — Progress Notes (Signed)
[facility] Summary Report / Loop Recorder 

## 2020-05-19 ENCOUNTER — Ambulatory Visit (INDEPENDENT_AMBULATORY_CARE_PROVIDER_SITE_OTHER): Payer: 59 | Admitting: *Deleted

## 2020-05-19 DIAGNOSIS — I639 Cerebral infarction, unspecified: Secondary | ICD-10-CM | POA: Diagnosis not present

## 2020-05-19 LAB — CUP PACEART REMOTE DEVICE CHECK
Date Time Interrogation Session: 20210803231553
Implantable Pulse Generator Implant Date: 20181002

## 2020-05-20 NOTE — Progress Notes (Signed)
[facility] Summary Report / Loop Recorder 

## 2020-06-18 LAB — CUP PACEART REMOTE DEVICE CHECK
Date Time Interrogation Session: 20210905233516
Implantable Pulse Generator Implant Date: 20181002

## 2020-06-23 ENCOUNTER — Ambulatory Visit (INDEPENDENT_AMBULATORY_CARE_PROVIDER_SITE_OTHER): Payer: 59 | Admitting: *Deleted

## 2020-06-23 DIAGNOSIS — I639 Cerebral infarction, unspecified: Secondary | ICD-10-CM

## 2020-06-24 NOTE — Progress Notes (Signed)
[facility] Summary Report / Loop Recorder 

## 2020-07-19 LAB — CUP PACEART REMOTE DEVICE CHECK
Date Time Interrogation Session: 20211008233751
Implantable Pulse Generator Implant Date: 20181002

## 2020-07-28 ENCOUNTER — Ambulatory Visit (INDEPENDENT_AMBULATORY_CARE_PROVIDER_SITE_OTHER): Payer: 59

## 2020-07-28 DIAGNOSIS — I639 Cerebral infarction, unspecified: Secondary | ICD-10-CM

## 2020-08-04 NOTE — Progress Notes (Signed)
[facility] Summary Report / Loop Recorder 

## 2020-09-01 ENCOUNTER — Ambulatory Visit (INDEPENDENT_AMBULATORY_CARE_PROVIDER_SITE_OTHER): Payer: 59

## 2020-09-01 DIAGNOSIS — I639 Cerebral infarction, unspecified: Secondary | ICD-10-CM

## 2020-09-01 LAB — CUP PACEART REMOTE DEVICE CHECK
Date Time Interrogation Session: 20211121233501
Implantable Pulse Generator Implant Date: 20181002

## 2020-09-08 NOTE — Progress Notes (Signed)
[facility] Summary Report / Loop Recorder 

## 2020-10-06 ENCOUNTER — Ambulatory Visit (INDEPENDENT_AMBULATORY_CARE_PROVIDER_SITE_OTHER): Payer: 59

## 2020-10-06 DIAGNOSIS — I639 Cerebral infarction, unspecified: Secondary | ICD-10-CM | POA: Diagnosis not present

## 2020-10-07 LAB — CUP PACEART REMOTE DEVICE CHECK
Date Time Interrogation Session: 20211224233712
Implantable Pulse Generator Implant Date: 20181002

## 2020-10-17 NOTE — Progress Notes (Signed)
[facility] Summary Report / Loop Recorder 

## 2020-11-06 LAB — CUP PACEART REMOTE DEVICE CHECK
Date Time Interrogation Session: 20220126234216
Implantable Pulse Generator Implant Date: 20181002

## 2020-11-10 ENCOUNTER — Ambulatory Visit (INDEPENDENT_AMBULATORY_CARE_PROVIDER_SITE_OTHER): Payer: 59

## 2020-11-10 DIAGNOSIS — I639 Cerebral infarction, unspecified: Secondary | ICD-10-CM

## 2020-11-19 NOTE — Progress Notes (Signed)
[facility] Summary Report / Loop Recorder 

## 2020-12-15 ENCOUNTER — Ambulatory Visit (INDEPENDENT_AMBULATORY_CARE_PROVIDER_SITE_OTHER): Payer: 59

## 2020-12-15 DIAGNOSIS — I639 Cerebral infarction, unspecified: Secondary | ICD-10-CM | POA: Diagnosis not present

## 2020-12-17 LAB — CUP PACEART REMOTE DEVICE CHECK
Date Time Interrogation Session: 20220228235321
Implantable Pulse Generator Implant Date: 20181002

## 2020-12-23 NOTE — Progress Notes (Signed)
[facility] Summary Report / Loop Recorder 

## 2021-01-12 ENCOUNTER — Ambulatory Visit (INDEPENDENT_AMBULATORY_CARE_PROVIDER_SITE_OTHER): Payer: 59

## 2021-01-12 DIAGNOSIS — I639 Cerebral infarction, unspecified: Secondary | ICD-10-CM

## 2021-01-13 LAB — CUP PACEART REMOTE DEVICE CHECK
Date Time Interrogation Session: 20220403013053
Implantable Pulse Generator Implant Date: 20181002

## 2021-01-22 NOTE — Progress Notes (Signed)
[facility] Summary Report / Loop Recorder 

## 2021-01-23 NOTE — Addendum Note (Signed)
Addended by: Geralyn Flash D on: 01/23/2021 05:57 PM   Modules accepted: Level of Service

## 2021-02-13 LAB — CUP PACEART REMOTE DEVICE CHECK
Date Time Interrogation Session: 20220506013249
Implantable Pulse Generator Implant Date: 20181002

## 2021-02-16 ENCOUNTER — Ambulatory Visit (INDEPENDENT_AMBULATORY_CARE_PROVIDER_SITE_OTHER): Payer: 59

## 2021-02-16 DIAGNOSIS — I639 Cerebral infarction, unspecified: Secondary | ICD-10-CM | POA: Diagnosis not present

## 2021-03-02 ENCOUNTER — Telehealth: Payer: Self-pay | Admitting: Physician Assistant

## 2021-03-02 NOTE — Telephone Encounter (Signed)
Received a new hem referral from Dr. Hyman Hopes for decreasing wbc and hgb/hct. [name] has been cld and scheduled to see Karena Addison on 5/31 at 2pm. Pt aware to arrive 20 minutes early.

## 2021-03-10 ENCOUNTER — Encounter: Payer: Self-pay | Admitting: Physician Assistant

## 2021-03-10 ENCOUNTER — Telehealth: Payer: Self-pay | Admitting: Emergency Medicine

## 2021-03-10 ENCOUNTER — Inpatient Hospital Stay: Payer: 59 | Admitting: Physician Assistant

## 2021-03-10 ENCOUNTER — Inpatient Hospital Stay: Payer: 59 | Attending: Physician Assistant

## 2021-03-10 ENCOUNTER — Other Ambulatory Visit: Payer: Self-pay

## 2021-03-10 VITALS — BP 139/99 | HR 78 | Temp 97.3°F | Resp 18 | Ht 66.0 in | Wt 281.0 lb

## 2021-03-10 DIAGNOSIS — Z87891 Personal history of nicotine dependence: Secondary | ICD-10-CM

## 2021-03-10 DIAGNOSIS — I10 Essential (primary) hypertension: Secondary | ICD-10-CM | POA: Insufficient documentation

## 2021-03-10 DIAGNOSIS — Z8673 Personal history of transient ischemic attack (TIA), and cerebral infarction without residual deficits: Secondary | ICD-10-CM | POA: Insufficient documentation

## 2021-03-10 DIAGNOSIS — D72819 Decreased white blood cell count, unspecified: Secondary | ICD-10-CM

## 2021-03-10 DIAGNOSIS — Z79899 Other long term (current) drug therapy: Secondary | ICD-10-CM | POA: Insufficient documentation

## 2021-03-10 DIAGNOSIS — Z9884 Bariatric surgery status: Secondary | ICD-10-CM | POA: Insufficient documentation

## 2021-03-10 DIAGNOSIS — E119 Type 2 diabetes mellitus without complications: Secondary | ICD-10-CM | POA: Insufficient documentation

## 2021-03-10 DIAGNOSIS — K219 Gastro-esophageal reflux disease without esophagitis: Secondary | ICD-10-CM | POA: Diagnosis not present

## 2021-03-10 DIAGNOSIS — D649 Anemia, unspecified: Secondary | ICD-10-CM

## 2021-03-10 DIAGNOSIS — Z8042 Family history of malignant neoplasm of prostate: Secondary | ICD-10-CM | POA: Diagnosis not present

## 2021-03-10 LAB — CMP (CANCER CENTER ONLY)
ALT: 31 U/L (ref 0–44)
AST: 37 U/L (ref 15–41)
Albumin: 3.8 g/dL (ref 3.5–5.0)
Alkaline Phosphatase: 162 U/L — ABNORMAL HIGH (ref 38–126)
Anion gap: 6 (ref 5–15)
BUN: 16 mg/dL (ref 6–20)
CO2: 29 mmol/L (ref 22–32)
Calcium: 9.1 mg/dL (ref 8.9–10.3)
Chloride: 104 mmol/L (ref 98–111)
Creatinine: 1.27 mg/dL — ABNORMAL HIGH (ref 0.44–1.00)
GFR, Estimated: 50 mL/min — ABNORMAL LOW (ref >60)
Glucose, Bld: 93 mg/dL (ref 70–99)
Potassium: 4.4 mmol/L (ref 3.5–5.1)
Sodium: 139 mmol/L (ref 135–145)
Total Bilirubin: 0.4 mg/dL (ref 0.3–1.2)
Total Protein: 7.5 g/dL (ref 6.5–8.1)

## 2021-03-10 LAB — CBC WITH DIFFERENTIAL (CANCER CENTER ONLY)
Abs Immature Granulocytes: 0.02 10*3/uL (ref 0.00–0.07)
Basophils Absolute: 0.1 10*3/uL (ref 0.0–0.1)
Basophils Relative: 1 %
Eosinophils Absolute: 0.2 10*3/uL (ref 0.0–0.5)
Eosinophils Relative: 3 %
HCT: 35.3 % — ABNORMAL LOW (ref 36.0–46.0)
Hemoglobin: 11.4 g/dL — ABNORMAL LOW (ref 12.0–15.0)
Immature Granulocytes: 0 %
Lymphocytes Relative: 21 %
Lymphs Abs: 1.2 10*3/uL (ref 0.7–4.0)
MCH: 29.2 pg (ref 26.0–34.0)
MCHC: 32.3 g/dL (ref 30.0–36.0)
MCV: 90.3 fL (ref 80.0–100.0)
Monocytes Absolute: 0.5 10*3/uL (ref 0.1–1.0)
Monocytes Relative: 9 %
Neutro Abs: 3.5 10*3/uL (ref 1.7–7.7)
Neutrophils Relative %: 66 %
Platelet Count: 230 10*3/uL (ref 150–400)
RBC: 3.91 MIL/uL (ref 3.87–5.11)
RDW: 16 % — ABNORMAL HIGH (ref 11.5–15.5)
WBC Count: 5.4 10*3/uL (ref 4.0–10.5)
nRBC: 0 % (ref 0.0–0.2)

## 2021-03-10 LAB — HIV ANTIBODY (ROUTINE TESTING W REFLEX): HIV Screen 4th Generation wRfx: NONREACTIVE

## 2021-03-10 LAB — HEPATITIS B SURFACE ANTIBODY,QUALITATIVE: Hep B S Ab: NONREACTIVE

## 2021-03-10 LAB — RETIC PANEL
Immature Retic Fract: 19.3 % — ABNORMAL HIGH (ref 2.3–15.9)
RBC.: 3.87 MIL/uL (ref 3.87–5.11)
Retic Count, Absolute: 24.8 10*3/uL (ref 19.0–186.0)
Retic Ct Pct: 0.6 % (ref 0.4–3.1)
Reticulocyte Hemoglobin: 37.5 pg (ref >27.9)

## 2021-03-10 LAB — VITAMIN B12: Vitamin B-12: 537 pg/mL (ref 180–914)

## 2021-03-10 LAB — HEPATITIS C ANTIBODY: HCV Ab: NONREACTIVE

## 2021-03-10 LAB — SAVE SMEAR(SSMR), FOR PROVIDER SLIDE REVIEW

## 2021-03-10 LAB — C-REACTIVE PROTEIN: CRP: 0.7 mg/dL (ref <1.0)

## 2021-03-10 LAB — FOLATE: Folate: 4.8 ng/mL — ABNORMAL LOW (ref >5.9)

## 2021-03-10 LAB — HEPATITIS B CORE ANTIBODY, TOTAL: Hep B Core Total Ab: NONREACTIVE

## 2021-03-10 LAB — HEPATITIS B SURFACE ANTIGEN: Hepatitis B Surface Ag: NONREACTIVE

## 2021-03-10 LAB — SEDIMENTATION RATE: Sed Rate: 23 mm/hr — ABNORMAL HIGH (ref 0–22)

## 2021-03-10 NOTE — Progress Notes (Signed)
[facility] Summary Report / Loop Recorder 

## 2021-03-10 NOTE — Progress Notes (Signed)
[facility] [phone]   [phone]  INITIAL CONSULT NOTE  Patient Care Team: Shirlean Mylar, MD as PCP - General (Family Medicine) Coralyn Helling, MD as Consulting Physician (Pulmonary Disease) Stacey Drain, MD as Consulting Physician (Rheumatology) Vilinda Flake, PhD as Consulting Physician (Psychology)  Hematological/Oncological History 1) 02/03/2021: Labs from PCP, Dr. Shirlean Mylar at [place] [facility] [place].  -WBC 2.6 (L), Hgb 10.6 (L), MCV 89.5, Plt 202, Neut # 1.5 (L), Lymph # 0.8 (L), Mono # 0.2 (L), Ferritin 100.1, Vitamin B12 362  2) 03/10/2021: Establish care with Georga Kaufmann PA-C  CHIEF COMPLAINTS/PURPOSE OF CONSULTATION:  "Anemia and Leukopenia "  HISTORY OF PRESENTING ILLNESS:  [name] 55 y.o. female with medical history significant for sarcoidosis, hypertension, hyperlipidemia, T2DM, hx of CVA, GERD, OSA, anxiety, depression and obesity s/p gastric sleeve resection. Patient is unaccompanied for this visit.   On exam today, [name] reports chronic fatigue that has been present for several years. She continues to complete her ADLs on her own. She has a good appetite and notes continues to gain weight. For the last two weeks, patient notes intermittent episodes of nausea and vomiting. She denies any specific triggers and has not tried any medication at this time. Patient denies any abdominal pain. She reports constipation with hard stools and straining. Patient reports hematochezia with her bowel movements when she is straining. Patient has not taken any stool softeners or laxatives due to concern for diarrhea, especially at her work place. Her most recent colonoscopy was last year which was unremarkable. She has chronic shortness of breath with exertion but none at rest. Patient denies any fevers, chills, night sweats, cheat pain or cough. She has no other complaints. Rest of the 10 point ROS is below.   MEDICAL HISTORY:  Past Medical  History:  Diagnosis Date  . Allergic rhinitis   . Anemia   . Anxiety   . Arthritis   . Asthma       . Depression   . GERD (gastroesophageal reflux disease)   . Goiter   . Hyperlipidemia   . Hypertension    no meds since wt loss  . OSA (obstructive sleep apnea)    not on CPAP  . Sarcoidosis    Pulmonary, skin involvement  . Stroke [facility])     SURGICAL HISTORY: Past Surgical History:  Procedure Laterality Date  . ABDOMINAL HYSTERECTOMY     Fibroids  . BREATH TEK H PYLORI N/A 01/29/2014   Procedure: BREATH TEK H PYLORI;  Surgeon: Kandis Cocking, MD;  Location: [facility];  Service: General;  Laterality: N/A;  . LAPAROSCOPIC GASTRIC SLEEVE RESECTION N/A 06/04/2014   Procedure: LAPAROSCOPIC GASTRIC SLEEVE RESECTION;  Surgeon: Kandis Cocking, MD;  Location: [facility];  Service: General;  Laterality: N/A;  . LOOP RECORDER INSERTION N/A 07/12/2017   Procedure: LOOP RECORDER INSERTION;  Surgeon: Hillis Range, MD;  Location: [facility];  Service: Cardiovascular;  Laterality: N/A;  . OVARIAN CYST REMOVAL    . PERCUTANEOUS PINNING Right 11/11/2015   Procedure: PERCUTANEOUS PINNING PROXIMAL PHALANX RIGHT SMALL FINGER  OPEN;  Surgeon: Cindee Salt, MD;  Location: [facility];  Service: Orthopedics;  Laterality: Right;  . TEE WITHOUT CARDIOVERSION N/A 07/12/2017   Procedure: TRANSESOPHAGEAL ECHOCARDIOGRAM (TEE);  Surgeon: Lewayne Bunting, MD;  Location: [facility];  Service: Cardiovascular;  Laterality: N/A;  . UPPER GI ENDOSCOPY  06/04/2014   Procedure: UPPER GI ENDOSCOPY;  Surgeon: Kandis Cocking, MD;  Location: [facility];  Service: General;;    SOCIAL HISTORY: Social History   Socioeconomic History  . Marital status: Single    Spouse name: Not on file  . Number of children: Not on file  . Years of education: Not on file  . Highest education level: Not on file  Occupational History  . Occupation: Diplomatic Services operational officer  Tobacco Use  . Smoking status: Former Smoker     Packs/day: 0.50    Years: 12.00    Pack years: 6.00    Quit date: 10/11/1996    Years since quitting: 24.4  . Smokeless tobacco: Former Neurosurgeon    Types: Chew, Snuff  Substance and Sexual Activity  . Alcohol use: Yes    Comment: occassional  . Drug use: No  . Sexual activity: Not on file  Other Topics Concern  . Not on file  Social History Narrative  . Not on file   Social Determinants of Health   Financial Resource Strain: Not on file  Food Insecurity: Not on file  Transportation Needs: Not on file  Physical Activity: Not on file  Stress: Not on file  Social Connections: Not on file  Intimate Partner Violence: Not on file    FAMILY HISTORY: Family History  Problem Relation Age of Onset  . Heart disease Mother   . Kidney disease Father   . Sarcoidosis Brother   . Prostate cancer Brother   . Sarcoidosis Other        niece  . Hyperlipidemia Other   . Hypertension Other   . Stroke Other   . Obesity Other   . Sarcoidosis Brother     ALLERGIES:  is allergic to ibuprofen.  MEDICATIONS:  Current Outpatient Medications  Medication Sig Dispense Refill  . aspirin 325 MG tablet Take 1 tablet (325 mg total) by mouth daily. 30 tablet 0  . atorvastatin (LIPITOR) 80 MG tablet Take 1 tablet (80 mg total) by mouth daily at 6 PM. 30 tablet 0  . escitalopram (LEXAPRO) 20 MG tablet Take 20 mg by mouth daily.    . folic acid (FOLVITE) 1 MG tablet     . hydroxychloroquine (PLAQUENIL) 200 MG tablet Take 400 mg by mouth daily.    . methotrexate (RHEUMATREX) 2.5 MG tablet Take 2.5 mg by mouth once a week. Take 4 tablets Tuesday......Marland KitchenCaution:Chemotherapy. Protect from light.    Marland Kitchen omeprazole (PRILOSEC) 20 MG capsule Take 20 mg by mouth daily.  3   No current facility-administered medications for this visit.    REVIEW OF SYSTEMS:   Constitutional: ( - ) fevers, ( - )  chills , ( - ) night sweats Eyes: ( - ) blurriness of vision, ( - ) double vision, ( - ) watery eyes Ears, nose, mouth,  throat, and face: ( - ) mucositis, ( - ) sore throat Respiratory: ( - ) cough, ( + ) dyspnea, ( - ) wheezes Cardiovascular: ( - ) palpitation, ( - ) chest discomfort, ( - ) lower extremity swelling Gastrointestinal:  ( + ) nausea, ( - ) heartburn, ( - ) change in bowel habits Skin: ( - ) abnormal skin rashes Lymphatics: ( - ) new lymphadenopathy, ( - ) easy bruising Neurological: ( - ) numbness, ( - ) tingling, ( - ) new weaknesses Behavioral/Psych: ( - ) mood change, ( - ) new changes  All other systems were reviewed with the patient and are negative.  PHYSICAL EXAMINATION: ECOG PERFORMANCE STATUS: 1 - Symptomatic but completely ambulatory  Vitals:   03/10/21 1408  BP: (!) 139/99  Pulse: 78  Resp: 18  Temp: (!) 97.3 F (36.3 C)  SpO2: 100%   Filed Weights   03/10/21 1408  Weight: 281 lb (127.5 kg)    GENERAL: African American female in NAD, obese SKIN: skin color, texture, turgor are normal, no rashes or significant lesions EYES: conjunctiva are pink and non-injected, sclera clear OROPHARYNX: no exudate, no erythema; lips, buccal mucosa, and tongue normal  NECK: supple, non-tender LYMPH:  no palpable lymphadenopathy in the cervical, axillary or supraclavicular lymph nodes.  LUNGS: clear to auscultation and percussion with normal breathing effort HEART: regular rate & rhythm and no murmurs and no lower extremity edema ABDOMEN: soft, non-tender, non-distended, normal bowel sounds Musculoskeletal: no cyanosis of digits and no clubbing  PSYCH: alert & oriented x 3, fluent speech NEURO: no focal motor/sensory deficits  LABORATORY DATA:  I have reviewed the data as listed CBC Latest Ref Rng & Units 03/10/2021 06/27/2017 06/27/2017  WBC 4.0 - 10.5 K/uL 5.4 - 2.6(L)  Hemoglobin 12.0 - 15.0 g/dL 11.4(L) 13.6 11.4(L)  Hematocrit 36.0 - 46.0 % 35.3(L) 40.0 34.8(L)  Platelets 150 - 400 K/uL 230 - 227    CMP Latest Ref Rng & Units 03/10/2021 08/18/2017 06/27/2017  Glucose 70 - 99  mg/dL 93 - 87  BUN 6 - 20 mg/dL 16 - 8  Creatinine 1.610.44 - 1.00 mg/dL 0.96(E1.27(H) - 4.54(U1.10(H)  Sodium 135 - 145 mmol/L 139 - 142  Potassium 3.5 - 5.1 mmol/L 4.4 - 3.8  Chloride 98 - 111 mmol/L 104 - 102  CO2 22 - 32 mmol/L 29 - -  Calcium 8.9 - 10.3 mg/dL 9.1 - -  Total Protein 6.5 - 8.1 g/dL 7.5 6.7 -  Total Bilirubin 0.3 - 1.2 mg/dL 0.4 0.4 -  Alkaline Phos 38 - 126 U/L 162(H) 109 -  AST 15 - 41 U/L 37 26 -  ALT 0 - 44 U/L 31 23 -    ASSESSMENT & PLAN [name] is a 55 y.o. female who presents to the clinic for anemia and leukopenia. I reviewed the potential underlying causes for both anemia and leukopenia. This includes nutritional anemias, iron deficiencies, hepatitis B or C, HIV, inflammatory processes, liver disease, medications and bone marrow disorders. Per review of history, underlying causes including sarcoidosis while on methotrexate and history of gastric sleeve resection. I recommend to proceed with full workup with labs to check CBC, CMP, vitamin B12, homocysteine, methylmalonic acid, iron and TIBC, ferritin, retic panel, folate, hepatitis B and C serologies, HIV serology and save smear.   #Normocytic anemia and leukopenia: --Outside labs from 02/03/2021 reviewed with Hgb 10.6 (L), MCV 89.5. --Underlying etiology includes anemia due to methotrexate for sarcoidosis versus iron/vitamin B12 malabsorption due to history of gastric sleeve resection --Labs today to check CBC, CMP, vitamin B12, homocysteine, methylmalonic acid, iron and TIBC, ferritin, retic panel, folate hepatitis B and C serologies, HIV serology and save smear.  --If above workup is negative, likely cause is methotrexate which can cause cytopenias. We would recommend a follow up with patient's rheumatologist to discuss alternative medications to manage sarcoidosis.  --RTC based on above workup.    Orders Placed This Encounter  Procedures  . CBC with Differential (Cancer Center Only)    Standing Status:   Future     Number of Occurrences:   1    Standing Expiration Date:   03/10/2022  . CMP (Cancer Center only)    Standing  Status:   Future    Number of Occurrences:   1    Standing Expiration Date:   03/10/2022  . Iron and TIBC    Standing Status:   Future    Number of Occurrences:   1    Standing Expiration Date:   03/10/2022  . Retic Panel    Standing Status:   Future    Number of Occurrences:   1    Standing Expiration Date:   03/10/2022  . Ferritin    Standing Status:   Future    Number of Occurrences:   1    Standing Expiration Date:   03/10/2022  . Vitamin B12    Standing Status:   Future    Number of Occurrences:   1    Standing Expiration Date:   03/10/2022  . Folate, Serum    Standing Status:   Future    Number of Occurrences:   1    Standing Expiration Date:   03/10/2022  . Methylmalonic acid, serum    Standing Status:   Future    Number of Occurrences:   1    Standing Expiration Date:   03/10/2022  . Homocysteine, serum    Standing Status:   Future    Number of Occurrences:   1    Standing Expiration Date:   03/10/2022  . Hepatitis B core antibody, total    Standing Status:   Future    Number of Occurrences:   1    Standing Expiration Date:   03/10/2022  . Hepatitis B surface antibody    Standing Status:   Future    Number of Occurrences:   1    Standing Expiration Date:   03/10/2022  . Hepatitis B surface antigen    Standing Status:   Future    Number of Occurrences:   1    Standing Expiration Date:   03/10/2022  . Hepatitis C antibody    Standing Status:   Future    Number of Occurrences:   1    Standing Expiration Date:   03/10/2022  . HIV antibody (with reflex)    Standing Status:   Future    Number of Occurrences:   1    Standing Expiration Date:   03/10/2022  . Save Smear (SSMR)    Standing Status:   Future    Number of Occurrences:   1    Standing Expiration Date:   03/10/2022  . Sedimentation rate    Standing Status:   Future    Number of Occurrences:   1     Standing Expiration Date:   03/10/2022  . C-reactive protein    Standing Status:   Future    Number of Occurrences:   1    Standing Expiration Date:   03/10/2022    All questions were answered. The patient knows to call the clinic with any problems, questions or concerns.  I have spent a total of 60 minutes minutes of face-to-face and non-face-to-face time, preparing to see the patient, obtaining and/or reviewing separately obtained history, performing a medically appropriate examination, counseling and educating the patient, ordering tests, documenting clinical information in the electronic health record, and care coordination.   Georga Kaufmann, PA-C Department of Hematology/Oncology [facility] at [facility] Phone: [phone]  Patient was seen with Dr. Leonides Schanz.   I have read the above note and personally examined the patient. I agree with the assessment and plan as  noted above.  Briefly Mrs. Glassberg presents for evaluation of leukopenia and anemia.  The patient currently has multiple conditions which may lead to this including gastric sleeve and she takes methotrexate.  Order to work this up we will perform nutritional studies, though the most likely etiology would be the patient's methotrexate therapy.  Repeat labs today do show an increase in hemoglobin up to 11.4 with a normal white blood cell count.  If patient's counts drop too low may need to consider altering the dose of the methotrexate.  Ulysees Barns, MD Department of Hematology/Oncology [facility] at [facility] Phone: [phone] Pager: [phone] Email: [email or web address]

## 2021-03-10 NOTE — Telephone Encounter (Signed)
LMOM to call device , # provided.  LINQ at [facility].

## 2021-03-11 ENCOUNTER — Telehealth: Payer: Self-pay | Admitting: Physician Assistant

## 2021-03-11 DIAGNOSIS — D72819 Decreased white blood cell count, unspecified: Secondary | ICD-10-CM | POA: Insufficient documentation

## 2021-03-11 DIAGNOSIS — D649 Anemia, unspecified: Secondary | ICD-10-CM | POA: Insufficient documentation

## 2021-03-11 LAB — FERRITIN: Ferritin: 133 ng/mL (ref 11–307)

## 2021-03-11 LAB — IRON AND TIBC
Iron: 56 ug/dL (ref 41–142)
Saturation Ratios: 15 % — ABNORMAL LOW (ref 21–57)
TIBC: 365 ug/dL (ref 236–444)
UIBC: 309 ug/dL (ref 120–384)

## 2021-03-11 LAB — HOMOCYSTEINE: Homocysteine: 29.1 umol/L — ABNORMAL HIGH (ref 0.0–14.5)

## 2021-03-11 NOTE — Telephone Encounter (Signed)
I called [name] to review the lab results from yesterday 03/10/2021 after our consultation. CBC revealed leukopenia has resolved and there is mild normocytic anemia with hemoglobin of 11.4. Iron panel revealed iron deficiency with saturation of 15%. In addition, there is evidence of folate deficiency with folate level of 4.8. Remaining labs were unremarkable. Homocysteine and methylmalonic levels are pending.   Patient reports that she has a prescription for folic acid 1 mg once daily but admits that she does not take it consistently. I advised patient to take folic acid once a day as prescribed.   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 Depending on insurance, I will request IV feraheme or IV venofer.   I will plan to see the patient back in clinic 4 weeks after completion of IV iron with repeat labs.   Patient expressed understanding and satisfaction with the plan provided.

## 2021-03-11 NOTE — Telephone Encounter (Signed)
Patient notified that [name] is at [facility]. Patient does not want to schedule an extraction of the device at this  Time and will call back if she decides she wants to have the device extracted.

## 2021-03-12 ENCOUNTER — Other Ambulatory Visit: Payer: Self-pay | Admitting: Physician Assistant

## 2021-03-12 DIAGNOSIS — D508 Other iron deficiency anemias: Secondary | ICD-10-CM

## 2021-03-12 DIAGNOSIS — D509 Iron deficiency anemia, unspecified: Secondary | ICD-10-CM | POA: Insufficient documentation

## 2021-03-14 LAB — METHYLMALONIC ACID, SERUM: Methylmalonic Acid, Quantitative: 392 nmol/L — ABNORMAL HIGH (ref 0–378)

## 2021-03-16 ENCOUNTER — Encounter: Payer: Self-pay | Admitting: Physician Assistant

## 2021-03-17 ENCOUNTER — Telehealth: Payer: Self-pay | Admitting: Physician Assistant

## 2021-03-17 NOTE — Telephone Encounter (Signed)
I called [name] to review the remaining labs from 03/10/2021. I explained that both MMA and homocysteine levels were elevated that indicate B12 deficiency. Likely secondary to gastric sleeve resection. Recommendation is to proceed with weekly B12 injections for a month and then transition to once a month B12 injections.   Patient expressed understanding and satisfaction with the plan provided.

## 2021-06-08 ENCOUNTER — Telehealth: Payer: Self-pay | Admitting: Physician Assistant

## 2021-06-08 NOTE — Telephone Encounter (Signed)
Scheduled per sch msg. Called and left msg  

## 2021-06-12 ENCOUNTER — Other Ambulatory Visit: Payer: Self-pay

## 2021-06-12 ENCOUNTER — Other Ambulatory Visit: Payer: Self-pay | Admitting: Physician Assistant

## 2021-06-12 ENCOUNTER — Inpatient Hospital Stay: Payer: 59 | Attending: Physician Assistant

## 2021-06-12 DIAGNOSIS — Z87891 Personal history of nicotine dependence: Secondary | ICD-10-CM | POA: Insufficient documentation

## 2021-06-12 DIAGNOSIS — Z9884 Bariatric surgery status: Secondary | ICD-10-CM | POA: Insufficient documentation

## 2021-06-12 DIAGNOSIS — E538 Deficiency of other specified B group vitamins: Secondary | ICD-10-CM

## 2021-06-12 DIAGNOSIS — Z79899 Other long term (current) drug therapy: Secondary | ICD-10-CM | POA: Diagnosis not present

## 2021-06-12 DIAGNOSIS — D508 Other iron deficiency anemias: Secondary | ICD-10-CM

## 2021-06-12 DIAGNOSIS — D6489 Other specified anemias: Secondary | ICD-10-CM | POA: Diagnosis not present

## 2021-06-12 DIAGNOSIS — K912 Postsurgical malabsorption, not elsewhere classified: Secondary | ICD-10-CM | POA: Insufficient documentation

## 2021-06-12 DIAGNOSIS — D869 Sarcoidosis, unspecified: Secondary | ICD-10-CM | POA: Insufficient documentation

## 2021-06-12 LAB — CBC WITH DIFFERENTIAL (CANCER CENTER ONLY)
Abs Immature Granulocytes: 0 10*3/uL (ref 0.00–0.07)
Basophils Absolute: 0.1 10*3/uL (ref 0.0–0.1)
Basophils Relative: 1 %
Eosinophils Absolute: 0.2 10*3/uL (ref 0.0–0.5)
Eosinophils Relative: 5 %
HCT: 33.7 % — ABNORMAL LOW (ref 36.0–46.0)
Hemoglobin: 11.1 g/dL — ABNORMAL LOW (ref 12.0–15.0)
Immature Granulocytes: 0 %
Lymphocytes Relative: 31 %
Lymphs Abs: 1.1 10*3/uL (ref 0.7–4.0)
MCH: 28.2 pg (ref 26.0–34.0)
MCHC: 32.9 g/dL (ref 30.0–36.0)
MCV: 85.8 fL (ref 80.0–100.0)
Monocytes Absolute: 0.4 10*3/uL (ref 0.1–1.0)
Monocytes Relative: 11 %
Neutro Abs: 1.9 10*3/uL (ref 1.7–7.7)
Neutrophils Relative %: 52 %
Platelet Count: 217 10*3/uL (ref 150–400)
RBC: 3.93 MIL/uL (ref 3.87–5.11)
RDW: 15.4 % (ref 11.5–15.5)
WBC Count: 3.6 10*3/uL — ABNORMAL LOW (ref 4.0–10.5)
nRBC: 0 % (ref 0.0–0.2)

## 2021-06-12 LAB — IRON AND TIBC
Iron: 149 ug/dL — ABNORMAL HIGH (ref 41–142)
Saturation Ratios: 41 % (ref 21–57)
TIBC: 365 ug/dL (ref 236–444)
UIBC: 216 ug/dL (ref 120–384)

## 2021-06-12 LAB — VITAMIN B12: Vitamin B-12: 547 pg/mL (ref 180–914)

## 2021-06-12 LAB — FERRITIN: Ferritin: 70 ng/mL (ref 11–307)

## 2021-06-12 LAB — FOLATE: Folate: 76.4 ng/mL (ref >5.9)

## 2021-06-13 ENCOUNTER — Inpatient Hospital Stay: Payer: 59

## 2021-06-13 VITALS — BP 134/57 | HR 66 | Temp 98.5°F | Resp 16

## 2021-06-13 DIAGNOSIS — D508 Other iron deficiency anemias: Secondary | ICD-10-CM

## 2021-06-13 MED ORDER — CYANOCOBALAMIN 1000 MCG/ML IJ SOLN
INTRAMUSCULAR | Status: AC
  Filled 2021-06-13: qty 1

## 2021-06-13 MED ORDER — SODIUM CHLORIDE 0.9 % IV SOLN
200.0000 mg | Freq: Once | INTRAVENOUS | Status: AC
  Administered 2021-06-13: 200 mg via INTRAVENOUS
  Filled 2021-06-13: qty 200

## 2021-06-13 MED ORDER — SODIUM CHLORIDE 0.9 % IV SOLN: Freq: Once | INTRAVENOUS | Status: AC

## 2021-06-13 MED ORDER — CYANOCOBALAMIN 1000 MCG/ML IJ SOLN
1000.0000 ug | Freq: Once | INTRAMUSCULAR | Status: AC
  Administered 2021-06-13: 1000 ug via INTRAMUSCULAR

## 2021-06-13 NOTE — Patient Instructions (Addendum)
Iron Sucrose Injection What is this medication? IRON SUCROSE (EYE ern SOO krose) treats low levels of iron (iron deficiency anemia) in people with kidney disease. Iron is a mineral that plays an important role in making red blood cells, which carry oxygen from your lungs to the rest of your body. This medicine may be used for other purposes; ask your health care provider or pharmacist if you have questions. COMMON BRAND NAME(S): Venofer What should I tell my care team before I take this medication? They need to know if you have any of these conditions: Anemia not caused by low iron levels Heart disease High levels of iron in the blood Kidney disease Liver disease An unusual or allergic reaction to iron, other medications, foods, dyes, or preservatives Pregnant or trying to get pregnant Breast-feeding How should I use this medication? This medication is for infusion into a vein. It is given in a hospital or clinic setting. Talk to your care team about the use of this medication in children. While this medication may be prescribed for children as young as 2 years for selected conditions, precautions do apply. Overdosage: If you think you have taken too much of this medicine contact a poison control center or emergency room at once. NOTE: This medicine is only for you. Do not share this medicine with others. What if I miss a dose? It is important not to miss your dose. Call your care team if you are unable to keep an appointment. What may interact with this medication? Do not take this medication with any of the following: Deferoxamine Dimercaprol Other iron products This medication may also interact with the following: Chloramphenicol Deferasirox This list may not describe all possible interactions. Give your health care provider a list of all the medicines, herbs, non-prescription drugs, or dietary supplements you use. Also tell them if you smoke, drink alcohol, or use illegal drugs.  Some items may interact with your medicine. What should I watch for while using this medication? Visit your care team regularly. Tell your care team if your symptoms do not start to get better or if they get worse. You may need blood work done while you are taking this medication. You may need to follow a special diet. Talk to your care team. Foods that contain iron include: whole grains/cereals, dried fruits, beans, or peas, leafy green vegetables, and organ meats (liver, kidney). What side effects may I notice from receiving this medication? Side effects that you should report to your care team as soon as possible: Allergic reactions-skin rash, itching, hives, swelling of the face, lips, tongue, or throat Low blood pressure-dizziness, feeling faint or lightheaded, blurry vision Shortness of breath Side effects that usually do not require medical attention (report to your care team if they continue or are bothersome): Flushing Headache Joint pain Muscle pain Nausea Pain, redness, or irritation at injection site This list may not describe all possible side effects. Call your doctor for medical advice about side effects. You may report side effects to FDA at 1-800-FDA-1088. Where should I keep my medication? This medication is given in a hospital or clinic and will not be stored at home. NOTE: This sheet is a summary. It may not cover all possible information. If you have questions about this medicine, talk to your doctor, pharmacist, or health care provider.  Vitamin B12 Injection What is this medication? Vitamin B12 (VAHY tuh min B12) prevents and treats low vitamin B12 levels in your body. It is used in people   who do not get enough vitamin B12 from their diet or when their digestive tract does not absorb enough. Vitamin B12 plays an important role in maintaining the health of your nervous system and red blood cells. This medicine may be used for other purposes; ask your health care provider  or pharmacist if you have questions. COMMON BRAND NAME(S): B-12 Compliance Kit, B-12 Injection Kit, Cyomin, Dodex, LA-12, Nutri-Twelve, Physicians EZ Use B-12, Primabalt What should I tell my care team before I take this medication? They need to know if you have any of these conditions: Kidney disease Leber's disease Megaloblastic anemia An unusual or allergic reaction to cyanocobalamin, cobalt, other medications, foods, dyes, or preservatives Pregnant or trying to get pregnant Breast-feeding How should I use this medication? This medication is injected into a muscle or deeply under the skin. It is usually given in a clinic or care team's office. However, your care team may teach you how to inject yourself. Follow all instructions. Talk to your care team about the use of this medication in children. Special care may be needed. Overdosage: If you think you have taken too much of this medicine contact a poison control center or emergency room at once. NOTE: This medicine is only for you. Do not share this medicine with others. What if I miss a dose? If you are given your dose at a clinic or care team's office, call to reschedule your appointment. If you give your own injections, and you miss a dose, take it as soon as you can. If it is almost time for your next dose, take only that dose. Do not take double or extra doses. What may interact with this medication? Colchicine Heavy alcohol intake This list may not describe all possible interactions. Give your health care provider a list of all the medicines, herbs, non-prescription drugs, or dietary supplements you use. Also tell them if you smoke, drink alcohol, or use illegal drugs. Some items may interact with your medicine. What should I watch for while using this medication? Visit your care team regularly. You may need blood work done while you are taking this medication. You may need to follow a special diet. Talk to your care team. Limit your  alcohol intake and avoid smoking to get the best benefit. What side effects may I notice from receiving this medication? Side effects that you should report to your care team as soon as possible: Allergic reactions-skin rash, itching, hives, swelling of the face, lips, tongue, or throat Swelling of the ankles, hands, or feet Trouble breathing Side effects that usually do not require medical attention (report to your care team if they continue or are bothersome): Diarrhea This list may not describe all possible side effects. Call your doctor for medical advice about side effects. You may report side effects to FDA at 1-800-FDA-1088. Where should I keep my medication? Keep out of the reach of children. Store at room temperature between 15 and 30 degrees C (59 and 85 degrees F). Protect from light. Throw away any unused medication after the expiration date. NOTE: This sheet is a summary. It may not cover all possible information. If you have questions about this medicine, talk to your doctor, pharmacist, or health care provider.  2022 Elsevier/Gold Standard (2020-11-17 11:47:06)    2022 Elsevier/Gold Standard (2020-12-23 12:52:06)

## 2021-06-17 LAB — METHYLMALONIC ACID, SERUM: Methylmalonic Acid, Quantitative: 401 nmol/L — ABNORMAL HIGH (ref 0–378)

## 2021-06-20 ENCOUNTER — Inpatient Hospital Stay: Payer: 59

## 2021-06-20 ENCOUNTER — Other Ambulatory Visit: Payer: Self-pay

## 2021-06-20 VITALS — BP 116/80 | HR 72 | Temp 99.2°F | Resp 18

## 2021-06-20 DIAGNOSIS — D508 Other iron deficiency anemias: Secondary | ICD-10-CM | POA: Diagnosis not present

## 2021-06-20 MED ORDER — SODIUM CHLORIDE 0.9 % IV SOLN: Freq: Once | INTRAVENOUS | Status: AC

## 2021-06-20 MED ORDER — SODIUM CHLORIDE 0.9 % IV SOLN
200.0000 mg | Freq: Once | INTRAVENOUS | Status: AC
  Administered 2021-06-20: 200 mg via INTRAVENOUS
  Filled 2021-06-20: qty 200

## 2021-06-20 NOTE — Progress Notes (Signed)
Pt declined to stay for 30 min observation post iron infusion. Pt discharged in stable condition, ambulatory to lobby without complaints.

## 2021-06-20 NOTE — Patient Instructions (Signed)
Iron Sucrose Injection What is this medication? IRON SUCROSE (EYE ern SOO krose) treats low levels of iron (iron deficiency anemia) in people with kidney disease. Iron is a mineral that plays an important role in making red blood cells, which carry oxygen from your lungs to the rest of your body. This medicine may be used for other purposes; ask your health care provider or pharmacist if you have questions. COMMON BRAND NAME(S): Venofer What should I tell my care team before I take this medication? They need to know if you have any of these conditions: Anemia not caused by low iron levels Heart disease High levels of iron in the blood Kidney disease Liver disease An unusual or allergic reaction to iron, other medications, foods, dyes, or preservatives Pregnant or trying to get pregnant Breast-feeding How should I use this medication? This medication is for infusion into a vein. It is given in a hospital or clinic setting. Talk to your care team about the use of this medication in children. While this medication may be prescribed for children as young as 2 years for selected conditions, precautions do apply. Overdosage: If you think you have taken too much of this medicine contact a poison control center or emergency room at once. NOTE: This medicine is only for you. Do not share this medicine with others. What if I miss a dose? It is important not to miss your dose. Call your care team if you are unable to keep an appointment. What may interact with this medication? Do not take this medication with any of the following: Deferoxamine Dimercaprol Other iron products This medication may also interact with the following: Chloramphenicol Deferasirox This list may not describe all possible interactions. Give your health care provider a list of all the medicines, herbs, non-prescription drugs, or dietary supplements you use. Also tell them if you smoke, drink alcohol, or use illegal drugs.  Some items may interact with your medicine. What should I watch for while using this medication? Visit your care team regularly. Tell your care team if your symptoms do not start to get better or if they get worse. You may need blood work done while you are taking this medication. You may need to follow a special diet. Talk to your care team. Foods that contain iron include: whole grains/cereals, dried fruits, beans, or peas, leafy green vegetables, and organ meats (liver, kidney). What side effects may I notice from receiving this medication? Side effects that you should report to your care team as soon as possible: Allergic reactions-skin rash, itching, hives, swelling of the face, lips, tongue, or throat Low blood pressure-dizziness, feeling faint or lightheaded, blurry vision Shortness of breath Side effects that usually do not require medical attention (report to your care team if they continue or are bothersome): Flushing Headache Joint pain Muscle pain Nausea Pain, redness, or irritation at injection site This list may not describe all possible side effects. Call your doctor for medical advice about side effects. You may report side effects to FDA at 1-800-FDA-1088. Where should I keep my medication? This medication is given in a hospital or clinic and will not be stored at home. NOTE: This sheet is a summary. It may not cover all possible information. If you have questions about this medicine, talk to your doctor, pharmacist, or health care provider.  Vitamin B12 Injection What is this medication? Vitamin B12 (VAHY tuh min B12) prevents and treats low vitamin B12 levels in your body. It is used in people  who do not get enough vitamin B12 from their diet or when their digestive tract does not absorb enough. Vitamin B12 plays an important role in maintaining the health of your nervous system and red blood cells. This medicine may be used for other purposes; ask your health care provider  or pharmacist if you have questions. COMMON BRAND NAME(S): B-12 Compliance Kit, B-12 Injection Kit, Cyomin, Dodex, LA-12, Nutri-Twelve, Physicians EZ Use B-12, Primabalt What should I tell my care team before I take this medication? They need to know if you have any of these conditions: Kidney disease Leber's disease Megaloblastic anemia An unusual or allergic reaction to cyanocobalamin, cobalt, other medications, foods, dyes, or preservatives Pregnant or trying to get pregnant Breast-feeding How should I use this medication? This medication is injected into a muscle or deeply under the skin. It is usually given in a clinic or care team's office. However, your care team may teach you how to inject yourself. Follow all instructions. Talk to your care team about the use of this medication in children. Special care may be needed. Overdosage: If you think you have taken too much of this medicine contact a poison control center or emergency room at once. NOTE: This medicine is only for you. Do not share this medicine with others. What if I miss a dose? If you are given your dose at a clinic or care team's office, call to reschedule your appointment. If you give your own injections, and you miss a dose, take it as soon as you can. If it is almost time for your next dose, take only that dose. Do not take double or extra doses. What may interact with this medication? Colchicine Heavy alcohol intake This list may not describe all possible interactions. Give your health care provider a list of all the medicines, herbs, non-prescription drugs, or dietary supplements you use. Also tell them if you smoke, drink alcohol, or use illegal drugs. Some items may interact with your medicine. What should I watch for while using this medication? Visit your care team regularly. You may need blood work done while you are taking this medication. You may need to follow a special diet. Talk to your care team. Limit your  alcohol intake and avoid smoking to get the best benefit. What side effects may I notice from receiving this medication? Side effects that you should report to your care team as soon as possible: Allergic reactions-skin rash, itching, hives, swelling of the face, lips, tongue, or throat Swelling of the ankles, hands, or feet Trouble breathing Side effects that usually do not require medical attention (report to your care team if they continue or are bothersome): Diarrhea This list may not describe all possible side effects. Call your doctor for medical advice about side effects. You may report side effects to FDA at 1-800-FDA-1088. Where should I keep my medication? Keep out of the reach of children. Store at room temperature between 15 and 30 degrees C (59 and 85 degrees F). Protect from light. Throw away any unused medication after the expiration date. NOTE: This sheet is a summary. It may not cover all possible information. If you have questions about this medicine, talk to your doctor, pharmacist, or health care provider.  2022 Elsevier/Gold Standard (2020-11-17 11:47:06)    2022 Elsevier/Gold Standard (2020-12-23 12:52:06)

## 2021-06-26 ENCOUNTER — Other Ambulatory Visit: Payer: Self-pay

## 2021-06-26 ENCOUNTER — Inpatient Hospital Stay: Payer: 59

## 2021-06-26 VITALS — BP 125/87 | HR 69 | Temp 98.9°F | Resp 16

## 2021-06-26 DIAGNOSIS — D508 Other iron deficiency anemias: Secondary | ICD-10-CM

## 2021-06-26 MED ORDER — SODIUM CHLORIDE 0.9 % IV SOLN: Freq: Once | INTRAVENOUS | Status: AC

## 2021-06-26 MED ORDER — SODIUM CHLORIDE 0.9 % IV SOLN
200.0000 mg | Freq: Once | INTRAVENOUS | Status: AC
  Administered 2021-06-26: 200 mg via INTRAVENOUS
  Filled 2021-06-26: qty 200

## 2021-06-26 NOTE — Patient Instructions (Signed)

## 2021-06-26 NOTE — Progress Notes (Signed)
Pt declined to stay for 30 minute post Venofer infusion observation. Tolerated treatment well. VSS at discharge. Ambulatory to lobby.

## 2021-07-02 ENCOUNTER — Other Ambulatory Visit: Payer: Self-pay | Admitting: Physician Assistant

## 2021-07-02 DIAGNOSIS — D508 Other iron deficiency anemias: Secondary | ICD-10-CM

## 2021-07-02 DIAGNOSIS — E538 Deficiency of other specified B group vitamins: Secondary | ICD-10-CM

## 2021-07-03 ENCOUNTER — Ambulatory Visit: Payer: 59

## 2021-07-03 ENCOUNTER — Other Ambulatory Visit: Payer: Self-pay

## 2021-07-03 ENCOUNTER — Inpatient Hospital Stay: Payer: 59

## 2021-07-03 ENCOUNTER — Inpatient Hospital Stay: Payer: 59 | Admitting: Physician Assistant

## 2021-07-03 VITALS — BP 129/90 | HR 64 | Temp 98.2°F | Resp 18

## 2021-07-03 VITALS — BP 131/87 | HR 87 | Temp 97.8°F | Resp 19 | Ht 66.0 in | Wt 294.6 lb

## 2021-07-03 DIAGNOSIS — D508 Other iron deficiency anemias: Secondary | ICD-10-CM

## 2021-07-03 DIAGNOSIS — D72819 Decreased white blood cell count, unspecified: Secondary | ICD-10-CM

## 2021-07-03 DIAGNOSIS — D649 Anemia, unspecified: Secondary | ICD-10-CM | POA: Diagnosis not present

## 2021-07-03 DIAGNOSIS — E538 Deficiency of other specified B group vitamins: Secondary | ICD-10-CM

## 2021-07-03 LAB — CBC WITH DIFFERENTIAL (CANCER CENTER ONLY)
Abs Immature Granulocytes: 0.01 10*3/uL (ref 0.00–0.07)
Basophils Absolute: 0.1 10*3/uL (ref 0.0–0.1)
Basophils Relative: 1 %
Eosinophils Absolute: 0.3 10*3/uL (ref 0.0–0.5)
Eosinophils Relative: 7 %
HCT: 35.4 % — ABNORMAL LOW (ref 36.0–46.0)
Hemoglobin: 11.5 g/dL — ABNORMAL LOW (ref 12.0–15.0)
Immature Granulocytes: 0 %
Lymphocytes Relative: 30 %
Lymphs Abs: 1.1 10*3/uL (ref 0.7–4.0)
MCH: 28 pg (ref 26.0–34.0)
MCHC: 32.5 g/dL (ref 30.0–36.0)
MCV: 86.1 fL (ref 80.0–100.0)
Monocytes Absolute: 0.4 10*3/uL (ref 0.1–1.0)
Monocytes Relative: 10 %
Neutro Abs: 2 10*3/uL (ref 1.7–7.7)
Neutrophils Relative %: 52 %
Platelet Count: 214 10*3/uL (ref 150–400)
RBC: 4.11 MIL/uL (ref 3.87–5.11)
RDW: 16.2 % — ABNORMAL HIGH (ref 11.5–15.5)
WBC Count: 3.8 10*3/uL — ABNORMAL LOW (ref 4.0–10.5)
nRBC: 0 % (ref 0.0–0.2)

## 2021-07-03 LAB — FERRITIN: Ferritin: 371 ng/mL — ABNORMAL HIGH (ref 11–307)

## 2021-07-03 LAB — IRON AND TIBC
Iron: 138 ug/dL (ref 41–142)
Saturation Ratios: 44 % (ref 21–57)
TIBC: 313 ug/dL (ref 236–444)
UIBC: 174 ug/dL (ref 120–384)

## 2021-07-03 LAB — FOLATE: Folate: 12.2 ng/mL (ref >5.9)

## 2021-07-03 LAB — VITAMIN B12: Vitamin B-12: 826 pg/mL (ref 180–914)

## 2021-07-03 MED ORDER — CYANOCOBALAMIN 1000 MCG/ML IJ SOLN
1000.0000 ug | Freq: Once | INTRAMUSCULAR | Status: AC
Start: 2021-07-03 — End: 2021-07-03
  Administered 2021-07-03: 1000 ug via INTRAMUSCULAR

## 2021-07-03 MED ORDER — IRON SUCROSE 20 MG/ML IV SOLN
200.0000 mg | Freq: Once | INTRAVENOUS | Status: AC
  Administered 2021-07-03: 200 mg via INTRAVENOUS
  Filled 2021-07-03: qty 200

## 2021-07-03 MED ORDER — SODIUM CHLORIDE 0.9 % IV SOLN: Freq: Once | INTRAVENOUS | Status: AC

## 2021-07-03 NOTE — Progress Notes (Signed)
Pt refused to stay for 30 minute post Venofer infusion observation. Tolerated treatment well. VSS at discharge.  Ambulatory to lobby.

## 2021-07-03 NOTE — Progress Notes (Signed)
[facility] [phone]   [phone]  PROGRESS NOTE  Patient Care Team: Bonnie Mylar, MD as PCP - General (Family Medicine) Bonnie Helling, MD as Consulting Physician (Pulmonary Disease) Bonnie Drain, MD as Consulting Physician (Rheumatology) Bonnie Flake, PhD as Consulting Physician (Psychology)  Hematological/Oncological History 1) 02/03/2021: Labs from PCP, Dr. Shirlean Kidd at [facility] [place].  -WBC 2.6 (L), Hgb 10.6 (L), MCV 89.5, Plt 202, Neut # 1.5 (L), Lymph # 0.8 (L), Mono # 0.2 (L), Ferritin 100.1, Vitamin B12 362  2) 03/10/2021: Establish care with [name]  CHIEF COMPLAINT:  "Anemia and Leukopenia "  HISTORY OF PRESENTING ILLNESS:  [name] is a 55 y.o. female with medical history significant for sarcoidosis, hypertension, hyperlipidemia, T2DM, hx of CVA, GERD, OSA, anxiety, depression and obesity s/p gastric sleeve resection. Patient is unaccompanied for this visit. She is here today for her fourth week of IV Venofer and reports tolerating the infusions well. She is not taking any PO iron. She has received one B12 injection on 06/13/21 and has been taking oral folic acid supplement once daily. Since her last appointment, she saw her rheumatologist and reports she is continuing with her Methotrexate.   On exam today, [name] reports chronic fatigue that has not improved with IV iron or B12 injection. She reports baseline SOB with exertion. Denies any cough, hemoptysis or chest pain. Denies any signs of bleeding including gingival bleeding, blood in stools or melena. She does note increased bruising. She denies any joint pain. She reports she has a good appetite and incorporates iron-rich foods into her diet. She denies any nausea, vomiting, constipation or diarrhea. She denies any fevers, night sweats, chills or unintentional weight loss. She has no other concerns.  MEDICAL HISTORY:  Past Medical History:  Diagnosis Date    Allergic rhinitis    Anemia    Anxiety    Arthritis    Asthma        Depression    GERD (gastroesophageal reflux disease)    Goiter    Hyperlipidemia    Hypertension    no meds since wt loss   OSA (obstructive sleep apnea)    not on CPAP   Sarcoidosis    Pulmonary, skin involvement   Stroke [facility])     SURGICAL HISTORY: Past Surgical History:  Procedure Laterality Date   ABDOMINAL HYSTERECTOMY     Fibroids   BREATH TEK H PYLORI N/A 01/29/2014   Procedure: BREATH TEK H PYLORI;  Surgeon: Kandis Cocking, MD;  Location: [facility];  Service: General;  Laterality: N/A;   LAPAROSCOPIC GASTRIC SLEEVE RESECTION N/A 06/04/2014   Procedure: LAPAROSCOPIC GASTRIC SLEEVE RESECTION;  Surgeon: Kandis Cocking, MD;  Location: [facility];  Service: General;  Laterality: N/A;   LOOP RECORDER INSERTION N/A 07/12/2017   Procedure: LOOP RECORDER INSERTION;  Surgeon: Hillis Range, MD;  Location: [facility];  Service: Cardiovascular;  Laterality: N/A;   OVARIAN CYST REMOVAL     PERCUTANEOUS PINNING Right 11/11/2015   Procedure: PERCUTANEOUS PINNING PROXIMAL PHALANX RIGHT SMALL FINGER  OPEN;  Surgeon: Cindee Salt, MD;  Location: [facility];  Service: Orthopedics;  Laterality: Right;   TEE WITHOUT CARDIOVERSION N/A 07/12/2017   Procedure: TRANSESOPHAGEAL ECHOCARDIOGRAM (TEE);  Surgeon: Lewayne Bunting, MD;  Location: [facility];  Service: Cardiovascular;  Laterality: N/A;   UPPER GI ENDOSCOPY  06/04/2014   Procedure: UPPER GI ENDOSCOPY;  Surgeon: Kandis Cocking, MD;  Location: [facility];  Service: General;;    SOCIAL HISTORY: Social History   Socioeconomic History   Marital status: Single    Spouse name: Not on file   Number of children: Not on file   Years of education: Not on file   Highest education level: Not on file  Occupational History   Occupation: Diplomatic Services operational officer  Tobacco Use   Smoking status: Former    Packs/day: 0.50    Years: 12.00    Pack years: 6.00    Types:  Cigarettes    Quit date: 10/11/1996    Years since quitting: 24.7   Smokeless tobacco: Former    Types: Chew, Snuff  Substance and Sexual Activity   Alcohol use: Yes    Comment: occassional   Drug use: No   Sexual activity: Not on file  Other Topics Concern   Not on file  Social History Narrative   Not on file   Social Determinants of Health   Financial Resource Strain: Not on file  Food Insecurity: Not on file  Transportation Needs: Not on file  Physical Activity: Not on file  Stress: Not on file  Social Connections: Not on file  Intimate Partner Violence: Not on file    FAMILY HISTORY: Family History  Problem Relation Age of Onset   Heart disease Mother    Kidney disease Father    Sarcoidosis Brother    Prostate cancer Brother    Sarcoidosis Other        niece   Hyperlipidemia Other    Hypertension Other    Stroke Other    Obesity Other    Sarcoidosis Brother     ALLERGIES:  is allergic to ibuprofen.  MEDICATIONS:  Current Outpatient Medications  Medication Sig Dispense Refill   aspirin 325 MG tablet Take 1 tablet (325 mg total) by mouth daily. 30 tablet 0   atorvastatin (LIPITOR) 80 MG tablet Take 1 tablet (80 mg total) by mouth daily at 6 PM. 30 tablet 0   escitalopram (LEXAPRO) 20 MG tablet Take 20 mg by mouth daily.     folic acid (FOLVITE) 1 MG tablet      hydroxychloroquine (PLAQUENIL) 200 MG tablet Take 400 mg by mouth daily.     methotrexate (RHEUMATREX) 2.5 MG tablet Take 2.5 mg by mouth once a week. Take 4 tablets Tuesday......Marland KitchenCaution:Chemotherapy. Protect from light.     omeprazole (PRILOSEC) 20 MG capsule Take 20 mg by mouth daily.  3   No current facility-administered medications for this visit.    REVIEW OF SYSTEMS:   Constitutional: ( + ) fatigue, ( - ) fevers, ( - )  chills , ( - ) night sweats Eyes: ( - ) blurriness of vision, ( - ) double vision, ( - ) watery eyes Ears, nose, mouth, throat, and face: ( - ) mucositis, ( - ) sore  throat Respiratory: ( - ) cough, ( + ) dyspnea, ( - ) wheezes Cardiovascular: ( - ) palpitation, ( - ) chest discomfort, ( - ) lower extremity swelling Gastrointestinal:  ( + ) nausea, ( - ) heartburn, ( - ) change in bowel habits Skin: ( - ) abnormal skin rashes, ( + ) easy bruising Lymphatics: ( - ) new lymphadenopathy, ( - ) easy bruising Neurological: ( - ) numbness, ( - ) tingling, ( - ) new weaknesses Behavioral/Psych: ( - ) mood change, ( - ) new changes  All other systems were reviewed with the patient and are negative.  PHYSICAL EXAMINATION:  ECOG PERFORMANCE STATUS: 1 - Symptomatic but completely ambulatory  Vitals:   07/03/21 1424  BP: 131/87  Pulse: 87  Resp: 19  Temp: 97.8 F (36.6 C)  SpO2: 100%   Filed Weights   07/03/21 1424  Weight: 294 lb 9.6 oz (133.6 kg)    GENERAL: African American female in NAD, obese SKIN: skin color, texture, turgor are normal, no rashes or significant lesions EYES: conjunctiva are pink and non-injected, sclera clear OROPHARYNX: no exudate, no erythema; lips, buccal mucosa, and tongue normal  NECK: supple, non-tender LYMPH:  no palpable lymphadenopathy in the cervical, axillary or supraclavicular lymph nodes.  LUNGS: clear to auscultation and percussion with normal breathing effort HEART: regular rate & rhythm and no murmurs and no lower extremity edema ABDOMEN: soft, non-tender, non-distended, normal bowel sounds Musculoskeletal: no cyanosis of digits and no clubbing  PSYCH: alert & oriented x 3, fluent speech NEURO: no focal motor/sensory deficits  LABORATORY DATA:  I have reviewed the data as listed CBC Latest Ref Rng & Units 07/03/2021 06/12/2021 03/10/2021  WBC 4.0 - 10.5 K/uL 3.8(L) 3.6(L) 5.4  Hemoglobin 12.0 - 15.0 g/dL 11.5(L) 11.1(L) 11.4(L)  Hematocrit 36.0 - 46.0 % 35.4(L) 33.7(L) 35.3(L)  Platelets 150 - 400 K/uL 214 217 230    CMP Latest Ref Rng & Units 03/10/2021 08/18/2017 06/27/2017  Glucose 70 - 99 mg/dL 93 - 87   BUN 6 - 20 mg/dL 16 - 8  Creatinine 1.61 - 1.00 mg/dL 0.96(E) - 4.54(U)  Sodium 135 - 145 mmol/L 139 - 142  Potassium 3.5 - 5.1 mmol/L 4.4 - 3.8  Chloride 98 - 111 mmol/L 104 - 102  CO2 22 - 32 mmol/L 29 - -  Calcium 8.9 - 10.3 mg/dL 9.1 - -  Total Protein 6.5 - 8.1 g/dL 7.5 6.7 -  Total Bilirubin 0.3 - 1.2 mg/dL 0.4 0.4 -  Alkaline Phos 38 - 126 U/L 162(H) 109 -  AST 15 - 41 U/L 37 26 -  ALT 0 - 44 U/L 31 23 -    ASSESSMENT & PLAN AISHAH TEFFETELLER is a 55 y.o. female who presents to the clinic for anemia and leukopenia.  #Normocytic anemia and leukopenia: --Underlying etiology includes anemia due to methotrexate for sarcoidosis versus iron/vitamin B12/folate malabsorption due to history of gastric sleeve resection --Patient has discussed with rheumatologist and will continue on methotrexate.  --Patient is s/p 4 doses of IV venofer and s/p one B12 injection. She is currently on daily folic acid supplementation --Labs today show Hgb is 11.5, WBC 3.8. Iron levels have improved with iron saturation 15% to 44%. Folate improved from 4.8 to 12.2. MMA levels pending.  --recommend to continue folic acid daily. Proceed with final dose of IV venofer today and continue monthly vitamin B12 injections.  --RTC 3 months with repeat labs.    No orders of the defined types were placed in this encounter.   All questions were answered. The patient knows to call the clinic with any problems, questions or concerns.  I have spent a total of 25 minutes minutes of face-to-face and non-face-to-face time, preparing to see the patient, performing a medically appropriate examination, counseling and educating the patient, ordering tests, documenting clinical information in the electronic health record, and care coordination.   Bonnie Kaufmann, Kidd Department of Hematology/Oncology [facility] at [facility] Phone: [phone]

## 2021-07-03 NOTE — Patient Instructions (Signed)
Iron Sucrose Injection What is this medication? IRON SUCROSE (EYE ern SOO krose) treats low levels of iron (iron deficiency anemia) in people with kidney disease. Iron is a mineral that plays an important role in making red blood cells, which carry oxygen from your lungs to the rest of your body. This medicine may be used for other purposes; ask your health care provider or pharmacist if you have questions. COMMON BRAND NAME(S): Venofer What should I tell my care team before I take this medication? They need to know if you have any of these conditions: Anemia not caused by low iron levels Heart disease High levels of iron in the blood Kidney disease Liver disease An unusual or allergic reaction to iron, other medications, foods, dyes, or preservatives Pregnant or trying to get pregnant Breast-feeding How should I use this medication? This medication is for infusion into a vein. It is given in a hospital or clinic setting. Talk to your care team about the use of this medication in children. While this medication may be prescribed for children as young as 2 years for selected conditions, precautions do apply. Overdosage: If you think you have taken too much of this medicine contact a poison control center or emergency room at once. NOTE: This medicine is only for you. Do not share this medicine with others. What if I miss a dose? It is important not to miss your dose. Call your care team if you are unable to keep an appointment. What may interact with this medication? Do not take this medication with any of the following: Deferoxamine Dimercaprol Other iron products This medication may also interact with the following: Chloramphenicol Deferasirox This list may not describe all possible interactions. Give your health care provider a list of all the medicines, herbs, non-prescription drugs, or dietary supplements you use. Also tell them if you smoke, drink alcohol, or use illegal drugs.  Some items may interact with your medicine. What should I watch for while using this medication? Visit your care team regularly. Tell your care team if your symptoms do not start to get better or if they get worse. You may need blood work done while you are taking this medication. You may need to follow a special diet. Talk to your care team. Foods that contain iron include: whole grains/cereals, dried fruits, beans, or peas, leafy green vegetables, and organ meats (liver, kidney). What side effects may I notice from receiving this medication? Side effects that you should report to your care team as soon as possible: Allergic reactions-skin rash, itching, hives, swelling of the face, lips, tongue, or throat Low blood pressure-dizziness, feeling faint or lightheaded, blurry vision Shortness of breath Side effects that usually do not require medical attention (report to your care team if they continue or are bothersome): Flushing Headache Joint pain Muscle pain Nausea Pain, redness, or irritation at injection site This list may not describe all possible side effects. Call your doctor for medical advice about side effects. You may report side effects to FDA at 1-800-FDA-1088. Where should I keep my medication? This medication is given in a hospital or clinic and will not be stored at home. NOTE: This sheet is a summary. It may not cover all possible information. If you have questions about this medicine, talk to your doctor, pharmacist, or health care provider.  Vitamin B12 Injection What is this medication? Vitamin B12 (VAHY tuh min B12) prevents and treats low vitamin B12 levels in your body. It is used in people   who do not get enough vitamin B12 from their diet or when their digestive tract does not absorb enough. Vitamin B12 plays an important role in maintaining the health of your nervous system and red blood cells. This medicine may be used for other purposes; ask your health care provider  or pharmacist if you have questions. COMMON BRAND NAME(S): B-12 Compliance Kit, B-12 Injection Kit, Cyomin, Dodex, LA-12, Nutri-Twelve, Physicians EZ Use B-12, Primabalt What should I tell my care team before I take this medication? They need to know if you have any of these conditions: Kidney disease Leber's disease Megaloblastic anemia An unusual or allergic reaction to cyanocobalamin, cobalt, other medications, foods, dyes, or preservatives Pregnant or trying to get pregnant Breast-feeding How should I use this medication? This medication is injected into a muscle or deeply under the skin. It is usually given in a clinic or care team's office. However, your care team may teach you how to inject yourself. Follow all instructions. Talk to your care team about the use of this medication in children. Special care may be needed. Overdosage: If you think you have taken too much of this medicine contact a poison control center or emergency room at once. NOTE: This medicine is only for you. Do not share this medicine with others. What if I miss a dose? If you are given your dose at a clinic or care team's office, call to reschedule your appointment. If you give your own injections, and you miss a dose, take it as soon as you can. If it is almost time for your next dose, take only that dose. Do not take double or extra doses. What may interact with this medication? Colchicine Heavy alcohol intake This list may not describe all possible interactions. Give your health care provider a list of all the medicines, herbs, non-prescription drugs, or dietary supplements you use. Also tell them if you smoke, drink alcohol, or use illegal drugs. Some items may interact with your medicine. What should I watch for while using this medication? Visit your care team regularly. You may need blood work done while you are taking this medication. You may need to follow a special diet. Talk to your care team. Limit your  alcohol intake and avoid smoking to get the best benefit. What side effects may I notice from receiving this medication? Side effects that you should report to your care team as soon as possible: Allergic reactions-skin rash, itching, hives, swelling of the face, lips, tongue, or throat Swelling of the ankles, hands, or feet Trouble breathing Side effects that usually do not require medical attention (report to your care team if they continue or are bothersome): Diarrhea This list may not describe all possible side effects. Call your doctor for medical advice about side effects. You may report side effects to FDA at 1-800-FDA-1088. Where should I keep my medication? Keep out of the reach of children. Store at room temperature between 15 and 30 degrees C (59 and 85 degrees F). Protect from light. Throw away any unused medication after the expiration date. NOTE: This sheet is a summary. It may not cover all possible information. If you have questions about this medicine, talk to your doctor, pharmacist, or health care provider.  2022 Elsevier/Gold Standard (2020-11-17 11:47:06)    2022 Elsevier/Gold Standard (2020-12-23 12:52:06)  

## 2021-07-05 ENCOUNTER — Encounter: Payer: Self-pay | Admitting: Physician Assistant

## 2021-07-05 DIAGNOSIS — E538 Deficiency of other specified B group vitamins: Secondary | ICD-10-CM | POA: Insufficient documentation

## 2021-07-09 LAB — METHYLMALONIC ACID, SERUM: Methylmalonic Acid, Quantitative: 336 nmol/L (ref 0–378)

## 2021-07-31 ENCOUNTER — Other Ambulatory Visit: Payer: Self-pay

## 2021-07-31 ENCOUNTER — Inpatient Hospital Stay: Payer: 59 | Attending: Physician Assistant

## 2021-07-31 DIAGNOSIS — E538 Deficiency of other specified B group vitamins: Secondary | ICD-10-CM | POA: Insufficient documentation

## 2021-07-31 DIAGNOSIS — D508 Other iron deficiency anemias: Secondary | ICD-10-CM

## 2021-07-31 MED ORDER — CYANOCOBALAMIN 1000 MCG/ML IJ SOLN
1000.0000 ug | Freq: Once | INTRAMUSCULAR | Status: AC
  Administered 2021-07-31: 1000 ug via INTRAMUSCULAR
  Filled 2021-07-31: qty 1

## 2021-09-04 ENCOUNTER — Other Ambulatory Visit: Payer: Self-pay

## 2021-09-04 ENCOUNTER — Inpatient Hospital Stay: Payer: 59 | Attending: Physician Assistant

## 2021-09-04 DIAGNOSIS — E538 Deficiency of other specified B group vitamins: Secondary | ICD-10-CM | POA: Insufficient documentation

## 2021-09-04 DIAGNOSIS — D508 Other iron deficiency anemias: Secondary | ICD-10-CM

## 2021-09-04 MED ORDER — CYANOCOBALAMIN 1000 MCG/ML IJ SOLN
1000.0000 ug | Freq: Once | INTRAMUSCULAR | Status: AC
  Administered 2021-09-04: 1000 ug via INTRAMUSCULAR
  Filled 2021-09-04: qty 1

## 2021-10-02 ENCOUNTER — Inpatient Hospital Stay: Payer: 59 | Attending: Physician Assistant

## 2021-10-02 ENCOUNTER — Inpatient Hospital Stay: Payer: 59 | Admitting: Hematology and Oncology

## 2021-10-02 ENCOUNTER — Other Ambulatory Visit: Payer: Self-pay

## 2021-10-02 ENCOUNTER — Other Ambulatory Visit: Payer: Self-pay | Admitting: Hematology and Oncology

## 2021-10-02 ENCOUNTER — Inpatient Hospital Stay: Payer: 59

## 2021-10-02 VITALS — BP 132/71 | HR 93 | Temp 97.7°F | Resp 18 | Wt 295.3 lb

## 2021-10-02 DIAGNOSIS — D649 Anemia, unspecified: Secondary | ICD-10-CM | POA: Insufficient documentation

## 2021-10-02 DIAGNOSIS — D72819 Decreased white blood cell count, unspecified: Secondary | ICD-10-CM

## 2021-10-02 DIAGNOSIS — E538 Deficiency of other specified B group vitamins: Secondary | ICD-10-CM

## 2021-10-02 DIAGNOSIS — D511 Vitamin B12 deficiency anemia due to selective vitamin B12 malabsorption with proteinuria: Secondary | ICD-10-CM | POA: Diagnosis not present

## 2021-10-02 DIAGNOSIS — D508 Other iron deficiency anemias: Secondary | ICD-10-CM

## 2021-10-02 LAB — CMP (CANCER CENTER ONLY)
ALT: 42 U/L (ref 0–44)
AST: 44 U/L — ABNORMAL HIGH (ref 15–41)
Albumin: 3.7 g/dL (ref 3.5–5.0)
Alkaline Phosphatase: 163 U/L — ABNORMAL HIGH (ref 38–126)
Anion gap: 7 (ref 5–15)
BUN: 19 mg/dL (ref 6–20)
CO2: 27 mmol/L (ref 22–32)
Calcium: 9.1 mg/dL (ref 8.9–10.3)
Chloride: 107 mmol/L (ref 98–111)
Creatinine: 1.41 mg/dL — ABNORMAL HIGH (ref 0.44–1.00)
GFR, Estimated: 44 mL/min — ABNORMAL LOW (ref >60)
Glucose, Bld: 110 mg/dL — ABNORMAL HIGH (ref 70–99)
Potassium: 4.1 mmol/L (ref 3.5–5.1)
Sodium: 141 mmol/L (ref 135–145)
Total Bilirubin: 0.6 mg/dL (ref 0.3–1.2)
Total Protein: 7 g/dL (ref 6.5–8.1)

## 2021-10-02 LAB — IRON AND IRON BINDING CAPACITY (CC-WL,HP ONLY)
Iron: 150 ug/dL (ref 28–170)
Saturation Ratios: 45 % — ABNORMAL HIGH (ref 10.4–31.8)
TIBC: 332 ug/dL (ref 250–450)
UIBC: 182 ug/dL (ref 148–442)

## 2021-10-02 LAB — CBC WITH DIFFERENTIAL (CANCER CENTER ONLY)
Abs Immature Granulocytes: 0 10*3/uL (ref 0.00–0.07)
Basophils Absolute: 0.1 10*3/uL (ref 0.0–0.1)
Basophils Relative: 2 %
Eosinophils Absolute: 0.3 10*3/uL (ref 0.0–0.5)
Eosinophils Relative: 7 %
HCT: 35.5 % — ABNORMAL LOW (ref 36.0–46.0)
Hemoglobin: 11.6 g/dL — ABNORMAL LOW (ref 12.0–15.0)
Immature Granulocytes: 0 %
Lymphocytes Relative: 24 %
Lymphs Abs: 1 10*3/uL (ref 0.7–4.0)
MCH: 28 pg (ref 26.0–34.0)
MCHC: 32.7 g/dL (ref 30.0–36.0)
MCV: 85.5 fL (ref 80.0–100.0)
Monocytes Absolute: 0.4 10*3/uL (ref 0.1–1.0)
Monocytes Relative: 9 %
Neutro Abs: 2.4 10*3/uL (ref 1.7–7.7)
Neutrophils Relative %: 58 %
Platelet Count: 207 10*3/uL (ref 150–400)
RBC: 4.15 MIL/uL (ref 3.87–5.11)
RDW: 14.5 % (ref 11.5–15.5)
WBC Count: 4.1 10*3/uL (ref 4.0–10.5)
nRBC: 0 % (ref 0.0–0.2)

## 2021-10-02 LAB — RETIC PANEL
Immature Retic Fract: 6.5 % (ref 2.3–15.9)
RBC.: 4.05 MIL/uL (ref 3.87–5.11)
Retic Count, Absolute: 24.3 10*3/uL (ref 19.0–186.0)
Retic Ct Pct: 0.6 % (ref 0.4–3.1)
Reticulocyte Hemoglobin: 32.6 pg (ref >27.9)

## 2021-10-02 LAB — VITAMIN B12: Vitamin B-12: 1062 pg/mL — ABNORMAL HIGH (ref 180–914)

## 2021-10-02 LAB — FOLATE: Folate: 9 ng/mL (ref >5.9)

## 2021-10-02 LAB — FERRITIN: Ferritin: 258 ng/mL (ref 11–307)

## 2021-10-02 MED ORDER — CYANOCOBALAMIN 1000 MCG/ML IJ SOLN
1000.0000 ug | Freq: Once | INTRAMUSCULAR | Status: AC
  Administered 2021-10-02: 15:00:00 1000 ug via INTRAMUSCULAR
  Filled 2021-10-02: qty 1

## 2021-10-02 NOTE — Progress Notes (Signed)
[facility] [phone]   [phone]  PROGRESS NOTE  Patient Care Team: Maurice Small, MD as PCP - General (Family Medicine) Chesley Mires, MD as Consulting Physician (Pulmonary Disease) Hurley Cisco, MD as Consulting Physician (Rheumatology) Arbutus Leas, PhD as Consulting Physician (Psychology)  Hematological/Oncological History 1) 02/03/2021: Labs from PCP, Dr. Maurice Small at [facility].  -WBC 2.6 (L), Hgb 10.6 (L), MCV 89.5, Plt 202, Neut # 1.5 (L), Lymph # 0.8 (L), Mono # 0.2 (L), Ferritin 100.1, Vitamin B12 362  2) 03/10/2021: Establish care with Dede Query PA-C   HISTORY OF PRESENTING ILLNESS:  [name] is a 55 y.o. female with medical history significant for sarcoidosis, hypertension, hyperlipidemia, T2DM, hx of CVA, GERD, OSA, anxiety, depression and obesity s/p gastric sleeve resection. Patient is unaccompanied for this visit.  He presents for a follow-up visit for her normocytic anemia. She was last seen on 07/03/2021.  On exam today, [name] reports her energy levels are improved as compared to her last visit in September.  She notes that vitamin B-12 appears to "be doing the trick".  She notes that she gets a good boost in energy with the shot.  She notes that she is not having any difficulty with bleeding, or bruising.  She notes that she has had no change in her methotrexate medication and she has not had any major symptom changes with her underlying rheumatological disease.  She denies any nausea, vomiting, constipation or diarrhea. She denies any fevers, night sweats, chills or unintentional weight loss. She has no other concerns.  A full 10 point ROS is listed below.  MEDICAL HISTORY:  Past Medical History:  Diagnosis Date   Allergic rhinitis    Anemia    Anxiety    Arthritis    Asthma        Depression    GERD (gastroesophageal reflux disease)    Goiter    Hyperlipidemia    Hypertension    no meds since wt  loss   OSA (obstructive sleep apnea)    not on CPAP   Sarcoidosis    Pulmonary, skin involvement   Stroke [facility])     SURGICAL HISTORY: Past Surgical History:  Procedure Laterality Date   ABDOMINAL HYSTERECTOMY     Fibroids   BREATH TEK H PYLORI N/A 01/29/2014   Procedure: BREATH TEK H PYLORI;  Surgeon: Shann Medal, MD;  Location: [facility];  Service: General;  Laterality: N/A;   LAPAROSCOPIC GASTRIC SLEEVE RESECTION N/A 06/04/2014   Procedure: LAPAROSCOPIC GASTRIC SLEEVE RESECTION;  Surgeon: Shann Medal, MD;  Location: [facility];  Service: General;  Laterality: N/A;   LOOP RECORDER INSERTION N/A 07/12/2017   Procedure: LOOP RECORDER INSERTION;  Surgeon: Thompson Grayer, MD;  Location: [facility];  Service: Cardiovascular;  Laterality: N/A;   OVARIAN CYST REMOVAL     PERCUTANEOUS PINNING Right 11/11/2015   Procedure: PERCUTANEOUS PINNING PROXIMAL PHALANX RIGHT SMALL FINGER  OPEN;  Surgeon: Daryll Brod, MD;  Location: [facility];  Service: Orthopedics;  Laterality: Right;   TEE WITHOUT CARDIOVERSION N/A 07/12/2017   Procedure: TRANSESOPHAGEAL ECHOCARDIOGRAM (TEE);  Surgeon: Lelon Perla, MD;  Location: [facility];  Service: Cardiovascular;  Laterality: N/A;   UPPER GI ENDOSCOPY  06/04/2014   Procedure: UPPER GI ENDOSCOPY;  Surgeon: Shann Medal, MD;  Location: [facility];  Service: General;;    SOCIAL HISTORY: Social History   Socioeconomic History   Marital status: Single  Spouse name: Not on file   Number of children: Not on file   Years of education: Not on file   Highest education level: Not on file  Occupational History   Occupation: Diplomatic Services operational officer  Tobacco Use   Smoking status: Former    Packs/day: 0.50    Years: 12.00    Pack years: 6.00    Types: Cigarettes    Quit date: 10/11/1996    Years since quitting: 25.0   Smokeless tobacco: Former    Types: Chew, Snuff  Substance and Sexual Activity   Alcohol use: Yes    Comment: occassional   Drug  use: No   Sexual activity: Not on file  Other Topics Concern   Not on file  Social History Narrative   Not on file   Social Determinants of Health   Financial Resource Strain: Not on file  Food Insecurity: Not on file  Transportation Needs: Not on file  Physical Activity: Not on file  Stress: Not on file  Social Connections: Not on file  Intimate Partner Violence: Not on file    FAMILY HISTORY: Family History  Problem Relation Age of Onset   Heart disease Mother    Kidney disease Father    Sarcoidosis Brother    Prostate cancer Brother    Sarcoidosis Other        niece   Hyperlipidemia Other    Hypertension Other    Stroke Other    Obesity Other    Sarcoidosis Brother     ALLERGIES:  is allergic to ibuprofen.  MEDICATIONS:  Current Outpatient Medications  Medication Sig Dispense Refill   aspirin 325 MG tablet Take 1 tablet (325 mg total) by mouth daily. 30 tablet 0   atorvastatin (LIPITOR) 80 MG tablet Take 1 tablet (80 mg total) by mouth daily at 6 PM. 30 tablet 0   escitalopram (LEXAPRO) 20 MG tablet Take 20 mg by mouth daily.     folic acid (FOLVITE) 1 MG tablet      hydroxychloroquine (PLAQUENIL) 200 MG tablet Take 400 mg by mouth daily.     methotrexate (RHEUMATREX) 2.5 MG tablet Take 2.5 mg by mouth once a week. Take 4 tablets Tuesday......Marland KitchenCaution:Chemotherapy. Protect from light.     omeprazole (PRILOSEC) 20 MG capsule Take 20 mg by mouth daily.  3   No current facility-administered medications for this visit.    REVIEW OF SYSTEMS:   Constitutional: ( + ) fatigue, ( - ) fevers, ( - )  chills , ( - ) night sweats Eyes: ( - ) blurriness of vision, ( - ) double vision, ( - ) watery eyes Ears, nose, mouth, throat, and face: ( - ) mucositis, ( - ) sore throat Respiratory: ( - ) cough, ( - ) dyspnea, ( - ) wheezes Cardiovascular: ( - ) palpitation, ( - ) chest discomfort, ( - ) lower extremity swelling Gastrointestinal:  ( - ) nausea, ( - ) heartburn, ( - )  change in bowel habits Skin: ( - ) abnormal skin rashes, ( + ) easy bruising Lymphatics: ( - ) new lymphadenopathy, ( - ) easy bruising Neurological: ( - ) numbness, ( - ) tingling, ( - ) new weaknesses Behavioral/Psych: ( - ) mood change, ( - ) new changes  All other systems were reviewed with the patient and are negative.  PHYSICAL EXAMINATION: ECOG PERFORMANCE STATUS: 1 - Symptomatic but completely ambulatory  Vitals:   10/02/21 1349  BP: 132/71  Pulse: 93  Resp:  18  Temp: 97.7 F (36.5 C)  SpO2: 97%   Filed Weights   10/02/21 1349  Weight: 295 lb 4.8 oz (133.9 kg)    GENERAL: African American female in NAD, obese SKIN: skin color, texture, turgor are normal, no rashes or significant lesions EYES: conjunctiva are pink and non-injected, sclera clear LUNGS: clear to auscultation and percussion with normal breathing effort HEART: regular rate & rhythm and no murmurs and no lower extremity edema Musculoskeletal: no cyanosis of digits and no clubbing  PSYCH: alert & oriented x 3, fluent speech NEURO: no focal motor/sensory deficits  LABORATORY DATA:  I have reviewed the data as listed CBC Latest Ref Rng & Units 10/02/2021 07/03/2021 06/12/2021  WBC 4.0 - 10.5 K/uL 4.1 3.8(L) 3.6(L)  Hemoglobin 12.0 - 15.0 g/dL 11.6(L) 11.5(L) 11.1(L)  Hematocrit 36.0 - 46.0 % 35.5(L) 35.4(L) 33.7(L)  Platelets 150 - 400 K/uL 207 214 217    CMP Latest Ref Rng & Units 10/02/2021 03/10/2021 08/18/2017  Glucose 70 - 99 mg/dL 110(H) 93 -  BUN 6 - 20 mg/dL 19 16 -  Creatinine 0.44 - 1.00 mg/dL 1.41(H) 1.27(H) -  Sodium 135 - 145 mmol/L 141 139 -  Potassium 3.5 - 5.1 mmol/L 4.1 4.4 -  Chloride 98 - 111 mmol/L 107 104 -  CO2 22 - 32 mmol/L 27 29 -  Calcium 8.9 - 10.3 mg/dL 9.1 9.1 -  Total Protein 6.5 - 8.1 g/dL 7.0 7.5 6.7  Total Bilirubin 0.3 - 1.2 mg/dL 0.6 0.4 0.4  Alkaline Phos 38 - 126 U/L 163(H) 162(H) 109  AST 15 - 41 U/L 44(H) 37 26  ALT 0 - 44 U/L 42 31 23    ASSESSMENT &  PLAN [name] is a 55 y.o. female who presents to the clinic for anemia and leukopenia.  #Normocytic anemia and leukopenia: --Underlying etiology includes anemia due to methotrexate for sarcoidosis versus iron/vitamin B12/folate malabsorption due to history of gastric sleeve resection --Patient has discussed with rheumatologist and will continue on methotrexate.  --Hgb today at 11.6, stable from prior. WBC 4.1, Plt 207.  --nutritional levels corrected, findings may be 2/2 to methotrexate medication and inflammation from sarcoidosis.  --RTC 6 months with repeat labs.   No orders of the defined types were placed in this encounter.  All questions were answered. The patient knows to call the clinic with any problems, questions or concerns.  I have spent a total of 30 minutes minutes of face-to-face and non-face-to-face time, preparing to see the patient, performing a medically appropriate examination, counseling and educating the patient, ordering tests, documenting clinical information in the electronic health record, and care coordination.   Ledell Peoples, MD Department of Hematology/Oncology [facility] Phone: [phone] Pager: [phone] Email: [email or web address]

## 2021-10-05 ENCOUNTER — Encounter: Payer: Self-pay | Admitting: Physician Assistant

## 2021-10-05 ENCOUNTER — Encounter: Payer: Self-pay | Admitting: Hematology and Oncology

## 2021-10-07 LAB — METHYLMALONIC ACID, SERUM: Methylmalonic Acid, Quantitative: 225 nmol/L (ref 0–378)

## 2022-03-10 ENCOUNTER — Telehealth: Payer: Self-pay | Admitting: Hematology and Oncology

## 2022-03-10 NOTE — Telephone Encounter (Signed)
Called patient regarding upcoming June appointments, patient is notified.  

## 2022-04-02 ENCOUNTER — Inpatient Hospital Stay: Payer: 59 | Attending: Hematology and Oncology

## 2022-04-02 ENCOUNTER — Other Ambulatory Visit: Payer: Self-pay | Admitting: Hematology and Oncology

## 2022-04-02 ENCOUNTER — Inpatient Hospital Stay: Payer: 59 | Admitting: Hematology and Oncology

## 2022-04-02 ENCOUNTER — Other Ambulatory Visit: Payer: Self-pay

## 2022-04-02 VITALS — BP 139/74 | HR 70 | Temp 97.6°F | Resp 18 | Ht 66.0 in | Wt 290.7 lb

## 2022-04-02 DIAGNOSIS — D649 Anemia, unspecified: Secondary | ICD-10-CM | POA: Diagnosis present

## 2022-04-02 DIAGNOSIS — D72819 Decreased white blood cell count, unspecified: Secondary | ICD-10-CM | POA: Diagnosis not present

## 2022-04-02 DIAGNOSIS — Z79899 Other long term (current) drug therapy: Secondary | ICD-10-CM | POA: Diagnosis not present

## 2022-04-02 DIAGNOSIS — E538 Deficiency of other specified B group vitamins: Secondary | ICD-10-CM | POA: Diagnosis not present

## 2022-04-02 LAB — CBC WITH DIFFERENTIAL (CANCER CENTER ONLY)
Abs Immature Granulocytes: 0 10*3/uL (ref 0.00–0.07)
Basophils Absolute: 0 10*3/uL (ref 0.0–0.1)
Basophils Relative: 1 %
Eosinophils Absolute: 0.2 10*3/uL (ref 0.0–0.5)
Eosinophils Relative: 5 %
HCT: 34.7 % — ABNORMAL LOW (ref 36.0–46.0)
Hemoglobin: 11.5 g/dL — ABNORMAL LOW (ref 12.0–15.0)
Immature Granulocytes: 0 %
Lymphocytes Relative: 24 %
Lymphs Abs: 0.9 10*3/uL (ref 0.7–4.0)
MCH: 28.8 pg (ref 26.0–34.0)
MCHC: 33.1 g/dL (ref 30.0–36.0)
MCV: 86.8 fL (ref 80.0–100.0)
Monocytes Absolute: 0.5 10*3/uL (ref 0.1–1.0)
Monocytes Relative: 14 %
Neutro Abs: 2 10*3/uL (ref 1.7–7.7)
Neutrophils Relative %: 56 %
Platelet Count: 206 10*3/uL (ref 150–400)
RBC: 4 MIL/uL (ref 3.87–5.11)
RDW: 14.4 % (ref 11.5–15.5)
WBC Count: 3.6 10*3/uL — ABNORMAL LOW (ref 4.0–10.5)
nRBC: 0 % (ref 0.0–0.2)

## 2022-04-02 LAB — RETIC PANEL
Immature Retic Fract: 12.7 % (ref 2.3–15.9)
RBC.: 3.97 MIL/uL (ref 3.87–5.11)
Retic Count, Absolute: 50.4 10*3/uL (ref 19.0–186.0)
Retic Ct Pct: 1.3 % (ref 0.4–3.1)
Reticulocyte Hemoglobin: 30.2 pg (ref >27.9)

## 2022-04-02 LAB — CMP (CANCER CENTER ONLY)
ALT: 28 U/L (ref 0–44)
AST: 35 U/L (ref 15–41)
Albumin: 3.8 g/dL (ref 3.5–5.0)
Alkaline Phosphatase: 148 U/L — ABNORMAL HIGH (ref 38–126)
Anion gap: 6 (ref 5–15)
BUN: 21 mg/dL — ABNORMAL HIGH (ref 6–20)
CO2: 28 mmol/L (ref 22–32)
Calcium: 9.5 mg/dL (ref 8.9–10.3)
Chloride: 104 mmol/L (ref 98–111)
Creatinine: 1.44 mg/dL — ABNORMAL HIGH (ref 0.44–1.00)
GFR, Estimated: 42 mL/min — ABNORMAL LOW (ref >60)
Glucose, Bld: 78 mg/dL (ref 70–99)
Potassium: 4.1 mmol/L (ref 3.5–5.1)
Sodium: 138 mmol/L (ref 135–145)
Total Bilirubin: 0.4 mg/dL (ref 0.3–1.2)
Total Protein: 7.3 g/dL (ref 6.5–8.1)

## 2022-04-02 LAB — VITAMIN B12: Vitamin B-12: 847 pg/mL (ref 180–914)

## 2022-04-02 LAB — IRON AND IRON BINDING CAPACITY (CC-WL,HP ONLY)
Iron: 126 ug/dL (ref 28–170)
Saturation Ratios: 34 % — ABNORMAL HIGH (ref 10.4–31.8)
TIBC: 371 ug/dL (ref 250–450)
UIBC: 245 ug/dL (ref 148–442)

## 2022-04-02 LAB — FERRITIN: Ferritin: 138 ng/mL (ref 11–307)

## 2022-04-02 LAB — FOLATE: Folate: 14.9 ng/mL (ref >5.9)

## 2022-04-06 ENCOUNTER — Encounter: Payer: Self-pay | Admitting: Physician Assistant

## 2022-04-07 ENCOUNTER — Other Ambulatory Visit: Payer: Self-pay | Admitting: Family Medicine

## 2022-04-07 DIAGNOSIS — N1832 Chronic kidney disease, stage 3b: Secondary | ICD-10-CM

## 2022-04-08 ENCOUNTER — Ambulatory Visit
Admission: RE | Admit: 2022-04-08 | Discharge: 2022-04-08 | Disposition: A | Discharge status: Home / Self Care | Payer: 59 | Source: Ambulatory Visit | Attending: Family Medicine | Admitting: Family Medicine

## 2022-04-08 DIAGNOSIS — N1832 Chronic kidney disease, stage 3b: Secondary | ICD-10-CM

## 2024-05-15 ENCOUNTER — Other Ambulatory Visit (HOSPITAL_COMMUNITY): Payer: Self-pay | Admitting: Family Medicine

## 2024-05-15 ENCOUNTER — Other Ambulatory Visit (HOSPITAL_BASED_OUTPATIENT_CLINIC_OR_DEPARTMENT_OTHER): Payer: Self-pay | Admitting: Family Medicine

## 2024-05-15 DIAGNOSIS — R1011 Right upper quadrant pain: Secondary | ICD-10-CM

## 2024-05-16 ENCOUNTER — Other Ambulatory Visit (HOSPITAL_BASED_OUTPATIENT_CLINIC_OR_DEPARTMENT_OTHER): Payer: Self-pay | Admitting: Family Medicine

## 2024-05-16 DIAGNOSIS — E2839 Other primary ovarian failure: Secondary | ICD-10-CM

## 2024-05-18 ENCOUNTER — Ambulatory Visit (HOSPITAL_BASED_OUTPATIENT_CLINIC_OR_DEPARTMENT_OTHER)
Admission: RE | Admit: 2024-05-18 | Discharge: 2024-05-18 | Disposition: A | Discharge status: Home / Self Care | Source: Ambulatory Visit | Attending: Family Medicine | Admitting: Family Medicine

## 2024-05-18 DIAGNOSIS — R1011 Right upper quadrant pain: Secondary | ICD-10-CM | POA: Insufficient documentation

## 2024-07-24 ENCOUNTER — Telehealth: Payer: Self-pay | Admitting: Cardiology

## 2024-07-24 NOTE — Telephone Encounter (Signed)
 Patient has an upcoming appointment on November will send a message to scheduling team to see if patient can be seen sooner for preop clearance

## 2024-07-24 NOTE — Telephone Encounter (Signed)
   Name: [name]  DOB: 10-Oct-1966  MRN: [id number]  Primary Cardiologist: None  Chart reviewed as part of pre-operative protocol coverage. Because of [name]'s past medical history and time since last visit, she will require a follow-up in-office visit in order to better assess preoperative cardiovascular risk.  Pre-op covering staff: - Please schedule appointment and call patient to inform them. If patient already had an upcoming appointment within acceptable timeframe, please add pre-op clearance to the appointment notes so provider is aware. - Please contact requesting surgeon's office via preferred method (i.e, phone, fax) to inform them of need for appointment prior to surgery.  In regards to aspirin  hold, decision will be made at the time of office visit.  Has not been seen by our practice since 2018.  Orren LOISE Fabry, PA-C  07/24/2024, 4:10 PM

## 2024-07-24 NOTE — Telephone Encounter (Signed)
   Pre-operative Risk Assessment    Patient Name: [name]  DOB: 05/24/1966 MRN: [id number]   Date of last office visit: n/a Date of next office visit: 08/13/2024   Request for Surgical Clearance    Procedure:  Laparoscopic cholecystectomy with ICG  Date of Surgery:  Clearance TBD                                Surgeon:  Lonni Pizza, MD Surgeon's Group or Practice Name:  [facility] Phone number:  [phone] Fax number:  [phone]   Type of Clearance Requested:   - Pharmacy:  Hold Aspirin      Type of Anesthesia:  General    Additional requests/questions:    [name] Schools   07/24/2024, 3:13 PM

## 2024-07-26 ENCOUNTER — Ambulatory Visit

## 2024-07-26 VITALS — BP 130/82 | HR 72 | Ht 65.0 in | Wt 276.0 lb

## 2024-07-26 DIAGNOSIS — Z8673 Personal history of transient ischemic attack (TIA), and cerebral infarction without residual deficits: Secondary | ICD-10-CM | POA: Diagnosis not present

## 2024-07-26 DIAGNOSIS — Z01810 Encounter for preprocedural cardiovascular examination: Secondary | ICD-10-CM | POA: Diagnosis not present

## 2024-07-26 DIAGNOSIS — Z95818 Presence of other cardiac implants and grafts: Secondary | ICD-10-CM

## 2024-07-26 DIAGNOSIS — E782 Mixed hyperlipidemia: Secondary | ICD-10-CM

## 2024-07-26 NOTE — Progress Notes (Signed)
 Cardiology Office Note   Date:  07/26/2024  ID:  ULONDA Kidd, DOB 1966-08-29, MRN [id number] PCP: Bonnie Anthony RAMAN, FNP  [facility] Providers Cardiologist:  Caron Poser, MD     History of Present Illness [name] is a 58 y.o. female PMH HLD, HTN, DM 2, and history of prior cryptogenic stroke status post loop recorder implantation 08/05/17) who presents for perioperative risk assessment before laparoscopic cholecystectomy.  Patient reports she is overall feeling well at her baseline.  She denies any chest discomfort.  She is able to climb 1-2 flights of stairs without stopping due to angina or dyspnea.  Her main complaint is abdominal pain and other issues related to her gallbladder.  Last LDL 76 08/05/2017.  Relevant CVD History - Loop recorder implant 08/05/2017 through 2021-08-05 without evidence of arrhythmia - TEE Aug 05, 2017 normal biventricular function with small oscillating density on posterior mitral valve leaflet thought to be a redundant cord; no signs of PFO or other intracardiac shunt by color Doppler and agitated saline - TTE 06/2017 normal biventricular function without valvular disease   ROS: Pt denies any chest discomfort, jaw pain, arm pain, palpitations, syncope, presyncope, orthopnea, PND, or LE edema.  Studies Reviewed I have independently reviewed the patient's ECG, previous cardiac testing, recent medical records.  Physical Exam VS:  BP 130/82 (BP Location: Right Arm, Patient Position: Sitting, Cuff Size: Normal)   Pulse 72   Ht 5' 5 (1.651 m)   Wt 276 lb (125.2 kg)   SpO2 99%   BMI 45.93 kg/m        Wt Readings from Last 3 Encounters:  07/26/24 276 lb (125.2 kg)  04/02/22 290 lb 11.2 oz (131.9 kg)  10/02/21 295 lb 4.8 oz (133.9 kg)    GEN: No acute distress. NECK: No JVD; No carotid bruits. CARDIAC: RRR, no murmurs, rubs, gallops. RESPIRATORY:  Clear to auscultation. EXTREMITIES:  Warm and well-perfused. No edema.  ASSESSMENT AND PLAN Perioperative risk  assessment History of stroke 2017-08-05 Patient presents for cardiovascular perioperative risk assessment before an upcoming laparoscopic cholecystectomy.  RCRI score is 2 given history of prior stroke indicating she is intermediate perioperative cardiovascular risk for an intermediate risk procedure.  She does not have any signs or symptoms of heart failure or angina and can perform greater than 4 METS.  She had a loop recorder from 2018-2022 that did not show any signs of atrial fibrillation.  At this point, we can plan to optimize her secondary prevention medications but she is otherwise optimized from a cardiovascular standpoint.  No further testing or treatment indicated.  Plan: - Continue ASA 81 mg daily - Continue Lipitor  80 mg daily - Will recheck lipids; goal LDL less than 55 given history of previous event.  We may need to add Zetia or Repatha if she is not at goal.  HLD As above, given history of prior CVA, would recommend LDL goal less than 55.  She is already on max dose Lipitor .  We will recheck lipid panel today and consider adding Zetia or Repatha if needed.  Status post loop recorder implantation 08-05-2017 Patient reports that her loop recorder was implanted in 2017-08-05 and the battery died in 08-05-21.  The device is still in situ.  She would like it removed.  Will refer her to our EP team for explantation.  Morbid obesity Complicating all aspects of care.  She does report a history of sleep apnea.  She notes that her PCP tried to get  a GLP-1 agonist medication approved, but there was insurance issues since she does not have diabetes.  I do think she would benefit greatly from this medication and additional weight loss.  We will plan to have her see our pharmacist to review potential options.        Dispo: RTC as needed  Signed, Caron Poser, MD

## 2024-07-26 NOTE — Patient Instructions (Signed)
 Medication Instructions:   Your physician recommends that you continue on your current medications as directed. Please refer to the Current Medication list given to you today.   *If you need a refill on your cardiac medications before your next appointment, please call your pharmacy*  Lab Work:  Your provider would like for you to have following labs drawn today LIPID PANEL.   If you have labs (blood work) drawn today and your tests are completely normal, you will receive your results only by: MyChart Message (if you have MyChart) OR A paper copy in the mail If you have any lab test that is abnormal or we need to change your treatment, we will call you to review the results.  Testing/Procedures:  No test ordered today   Follow-Up:  Referral for visit with PharmD for GLP1 medications  EP Referral to have Loop Recorder Removed (inactive since 2022)  At [facility], you and your health needs are our priority.  As part of our continuing mission to provide you with exceptional heart care, our providers are all part of one team.  This team includes your primary Cardiologist (physician) and Advanced Practice Providers or APPs (Physician Assistants and Nurse Practitioners) who all work together to provide you with the care you need, when you need it.  Your next appointment:   As Needed    Provider:   You may see Caron Poser, MD  or one of the following Advanced Practice Providers on your designated Care Team:   Lonni Meager, NP Lesley Maffucci, PA-C Bernardino Bring, PA-C Cadence [place], PA-C Tylene Lunch, NP Barnie Hila, NP    We recommend signing up for the patient portal called MyChart.  Sign up information is provided on this After Visit Summary.  MyChart is used to connect with patients for Virtual Visits (Telemedicine).  Patients are able to view lab/test results, encounter notes, upcoming appointments, etc.  Non-urgent messages can be sent to your provider as  well.   To learn more about what you can do with MyChart, go to [email or web address].

## 2024-07-27 ENCOUNTER — Ambulatory Visit: Payer: Self-pay

## 2024-07-27 LAB — LIPID PANEL
Chol/HDL Ratio: 3.6 ratio (ref 0.0–4.4)
Cholesterol, Total: 149 mg/dL (ref 100–199)
HDL: 41 mg/dL (ref >39)
LDL Chol Calc (NIH): 94 mg/dL (ref 0–99)
Triglycerides: 68 mg/dL (ref 0–149)
VLDL Cholesterol Cal: 14 mg/dL (ref 5–40)

## 2024-07-31 NOTE — Telephone Encounter (Signed)
 I will forward this to preop APP to review if pt has been cleared by Dr. Argentina 07/26/24

## 2024-08-01 NOTE — Telephone Encounter (Signed)
   Patient Name: [name]  DOB: Dec 04, 1965 MRN: [id number]  Primary Cardiologist: Caron Poser, MD  Chart reviewed as part of pre-operative protocol coverage. Given past medical history and time since last visit, based on ACC/AHA guidelines, [name] is at acceptable risk for the planned procedure without further cardiovascular testing.   Per Dr. Poser Perioperative risk assessment History of stroke 2018 Patient presents for cardiovascular perioperative risk assessment before an upcoming laparoscopic cholecystectomy.  RCRI score is 2 given history of prior stroke indicating she is intermediate perioperative cardiovascular risk for an intermediate risk procedure.  She does not have any signs or symptoms of heart failure or angina and can perform greater than 4 METS.  She had a loop recorder from 2018-2022 that did not show any signs of atrial fibrillation.  At this point, we can plan to optimize her secondary prevention medications but she is otherwise optimized from a cardiovascular standpoint.  No further testing or treatment indicated.  Per office protocol, if patient is without any new symptoms or concerns at the time of their virtual visit, she may hold ASA for 7 days prior to procedure. Please resume ASA as soon as possible postprocedure, at the discretion of the surgeon.    The patient was advised that if she develops new symptoms prior to surgery to contact our office to arrange for a follow-up visit, and she verbalized understanding.  I will route this recommendation to the requesting party via Epic fax function and remove from pre-op pool.  Please call with questions.  Lamarr Satterfield, NP 08/01/2024, 10:30 AM

## 2024-08-13 ENCOUNTER — Ambulatory Visit: Admitting: Cardiology

## 2024-08-17 NOTE — Progress Notes (Signed)
 Sent message, via epic in basket, requesting orders in epic from Careers adviser.

## 2024-08-20 ENCOUNTER — Ambulatory Visit: Payer: Self-pay | Admitting: Surgery

## 2024-08-20 DIAGNOSIS — Z01818 Encounter for other preprocedural examination: Secondary | ICD-10-CM

## 2024-08-22 NOTE — Patient Instructions (Signed)
 SURGICAL WAITING ROOM VISITATION  Patients having surgery or a procedure may have no more than 2 support people in the waiting area - these visitors may rotate.    Children under the age of 47 must have an adult with them who is not the patient.  Visitors with respiratory illnesses are discouraged from visiting and should remain at home.  If the patient needs to stay at the hospital during part of their recovery, the visitor guidelines for inpatient rooms apply. Pre-op nurse will coordinate an appropriate time for 1 support person to accompany patient in pre-op.  This support person may not rotate.    Please refer to the [facility] website for the visitor guidelines for Inpatients (after your surgery is over and you are in a regular room).       Your procedure is scheduled on:  08/29/2024    Report to [facility] Main Entrance    Report to admitting at   1100AM   Call this number if you have problems the morning of surgery [phone]   Do not eat food :After Midnight.   After Midnight you may have the following liquids until  1000______ AM DAY OF SURGERY  Water Non-Citrus Juices (without pulp, NO RED-Apple, White grape, White cranberry) Black Coffee (NO MILK/CREAM OR CREAMERS, sugar ok)  Clear Tea (NO MILK/CREAM OR CREAMERS, sugar ok) regular and decaf                             Plain Jell-O (NO RED)                                           Fruit ices (not with fruit pulp, NO RED)                                     Popsicles (NO RED)                                                               Sports drinks like Gatorade (NO RED)                            If you have questions, please contact your surgeon's office.      Oral Hygiene is also important to reduce your risk of infection.                                    Remember - BRUSH YOUR TEETH THE MORNING OF SURGERY WITH YOUR REGULAR TOOTHPASTE  DENTURES WILL BE REMOVED PRIOR TO SURGERY PLEASE DO NOT  APPLY Poly grip OR ADHESIVES!!!   Do NOT smoke after Midnight   Stop all vitamins and herbal supplements 7 days before surgery.   Take these medicines the morning of surgery with A SIP OF WATER:  amlodipine, lexapro , omeprazole, nebulizer if needed   DO NOT TAKE ANY ORAL DIABETIC MEDICATIONS DAY OF YOUR SURGERY  Bring CPAP  mask and tubing day of surgery.                              You may not have any metal on your body including hair pins, jewelry, and body piercing             Do not wear make-up, lotions, powders, perfumes/cologne, or deodorant  Do not wear nail polish including gel and S&S, artificial/acrylic nails, or any other type of covering on natural nails including finger and toenails. If you have artificial nails, gel coating, etc. that needs to be removed by a nail salon please have this removed prior to surgery or surgery may need to be canceled/ delayed if the surgeon/ anesthesia feels like they are unable to be safely monitored.   Do not shave  48 hours prior to surgery.               Men may shave face and neck.   Do not bring valuables to the hospital. [facility] IS NOT             RESPONSIBLE   FOR VALUABLES.   Contacts, glasses, dentures or bridgework may not be worn into surgery.   Bring small overnight bag day of surgery.   DO NOT BRING YOUR HOME MEDICATIONS TO THE HOSPITAL. PHARMACY WILL DISPENSE MEDICATIONS LISTED ON YOUR MEDICATION LIST TO YOU DURING YOUR ADMISSION IN THE HOSPITAL!    Patients discharged on the day of surgery will not be allowed to drive home.  Someone NEEDS to stay with you for the first 24 hours after anesthesia.   Special Instructions: Bring a copy of your healthcare power of attorney and living will documents the day of surgery if you haven't scanned them before.              Please read over the following fact sheets you were given: IF YOU HAVE QUESTIONS ABOUT YOUR PRE-OP INSTRUCTIONS PLEASE CALL [phone].   If you received a  COVID test during your pre-op visit  it is requested that you wear a mask when out in public, stay away from anyone that may not be feeling well and notify your surgeon if you develop symptoms. If you test positive for Covid or have been in contact with anyone that has tested positive in the last 10 days please notify you surgeon.    [facility] - Preparing for Surgery Before surgery, you can play an important role.  Because skin is not sterile, your skin needs to be as free of germs as possible.  You can reduce the number of germs on your skin by washing with CHG (chlorahexidine gluconate) soap before surgery.  CHG is an antiseptic cleaner which kills germs and bonds with the skin to continue killing germs even after washing. Please DO NOT use if you have an allergy to CHG or antibacterial soaps.  If your skin becomes reddened/irritated stop using the CHG and inform your nurse when you arrive at Short Stay. Do not shave (including legs and underarms) for at least 48 hours prior to the first CHG shower.  You may shave your face/neck.  Please follow these instructions carefully:  1.  Shower with CHG Soap the night before surgery ONLY (DO NOT USE THE SOAP THE MORNING OF SURGERY).  2.  If you choose to wash your hair, wash your hair first as usual with your normal  shampoo.  3.  After  you shampoo, rinse your hair and body thoroughly to remove the shampoo.                             4.  Use CHG as you would any other liquid soap.  You can apply chg directly to the skin and wash.  Gently with a scrungie or clean washcloth.  5.  Apply the CHG Soap to your body ONLY FROM THE NECK DOWN.   Do   not use on face/ open                           Wound or open sores. Avoid contact with eyes, ears mouth and   genitals (private parts).                       Wash face,  Genitals (private parts) with your normal soap.             6.  Wash thoroughly, paying special attention to the area where your    surgery  will be  performed.  7.  Thoroughly rinse your body with warm water from the neck down.  8.  DO NOT shower/wash with your normal soap after using and rinsing off the CHG Soap.                9.  Pat yourself dry with a clean towel.            10.  Wear clean pajamas.            11.  Place clean sheets on your bed the night of your first shower and do not  sleep with pets. Day of Surgery : Do not apply any CHG, lotions/deodorants the morning of surgery.  Please wear clean clothes to the hospital/surgery center.  FAILURE TO FOLLOW THESE INSTRUCTIONS MAY RESULT IN THE CANCELLATION OF YOUR SURGERY  PATIENT SIGNATURE_________________________________  NURSE SIGNATURE__________________________________  ________________________________________________________________________

## 2024-08-22 NOTE — Progress Notes (Signed)
 Anesthesia Review:  PCP: Cardiologist : [name] LOV 07/26/24   PPM/ ICD: Device Orders: last device check- 2022  Rep Notified:  Chest x-ray : EKG : 07/26/2024  Echo : Stress test: Cardiac Cath :   Activity level:  Sleep Study/ CPAP : Fasting Blood Sugar :      / Checks Blood Sugar -- times a day:    Blood Thinner/ Instructions /Last Dose: ASA / Instructions/ Last Dose :    325 mg aspirin 

## 2024-08-24 ENCOUNTER — Other Ambulatory Visit: Payer: Self-pay

## 2024-08-24 ENCOUNTER — Encounter (HOSPITAL_COMMUNITY)
Admission: RE | Admit: 2024-08-24 | Discharge: 2024-08-24 | Disposition: A | Discharge status: Home / Self Care | Source: Ambulatory Visit | Attending: Surgery | Admitting: Surgery

## 2024-08-24 ENCOUNTER — Encounter (HOSPITAL_COMMUNITY): Payer: Self-pay

## 2024-08-24 DIAGNOSIS — D869 Sarcoidosis, unspecified: Secondary | ICD-10-CM | POA: Diagnosis not present

## 2024-08-24 DIAGNOSIS — Z01812 Encounter for preprocedural laboratory examination: Secondary | ICD-10-CM | POA: Diagnosis present

## 2024-08-24 DIAGNOSIS — K219 Gastro-esophageal reflux disease without esophagitis: Secondary | ICD-10-CM | POA: Insufficient documentation

## 2024-08-24 DIAGNOSIS — Z87891 Personal history of nicotine dependence: Secondary | ICD-10-CM | POA: Diagnosis not present

## 2024-08-24 DIAGNOSIS — K8020 Calculus of gallbladder without cholecystitis without obstruction: Secondary | ICD-10-CM | POA: Insufficient documentation

## 2024-08-24 DIAGNOSIS — Z01818 Encounter for other preprocedural examination: Secondary | ICD-10-CM

## 2024-08-24 HISTORY — DX: Chronic kidney disease, unspecified: N18.9

## 2024-08-24 LAB — CBC WITH DIFFERENTIAL/PLATELET
Abs Immature Granulocytes: 0.01 K/uL (ref 0.00–0.07)
Basophils Absolute: 0 K/uL (ref 0.0–0.1)
Basophils Relative: 1 %
Eosinophils Absolute: 0.1 K/uL (ref 0.0–0.5)
Eosinophils Relative: 3 %
HCT: 32.3 % — ABNORMAL LOW (ref 36.0–46.0)
Hemoglobin: 10.4 g/dL — ABNORMAL LOW (ref 12.0–15.0)
Immature Granulocytes: 0 %
Lymphocytes Relative: 23 %
Lymphs Abs: 0.9 K/uL (ref 0.7–4.0)
MCH: 29.1 pg (ref 26.0–34.0)
MCHC: 32.2 g/dL (ref 30.0–36.0)
MCV: 90.2 fL (ref 80.0–100.0)
Monocytes Absolute: 0.5 K/uL (ref 0.1–1.0)
Monocytes Relative: 13 %
Neutro Abs: 2.3 K/uL (ref 1.7–7.7)
Neutrophils Relative %: 60 %
Platelets: 256 K/uL (ref 150–400)
RBC: 3.58 MIL/uL — ABNORMAL LOW (ref 3.87–5.11)
RDW: 17.1 % — ABNORMAL HIGH (ref 11.5–15.5)
WBC: 3.8 K/uL — ABNORMAL LOW (ref 4.0–10.5)
nRBC: 0 % (ref 0.0–0.2)

## 2024-08-24 LAB — COMPREHENSIVE METABOLIC PANEL WITH GFR
ALT: 21 U/L (ref 0–44)
AST: 34 U/L (ref 15–41)
Albumin: 3.8 g/dL (ref 3.5–5.0)
Alkaline Phosphatase: 155 U/L — ABNORMAL HIGH (ref 38–126)
Anion gap: 10 (ref 5–15)
BUN: 14 mg/dL (ref 6–20)
CO2: 26 mmol/L (ref 22–32)
Calcium: 9.4 mg/dL (ref 8.9–10.3)
Chloride: 102 mmol/L (ref 98–111)
Creatinine, Ser: 1.51 mg/dL — ABNORMAL HIGH (ref 0.44–1.00)
GFR, Estimated: 39 mL/min — ABNORMAL LOW (ref >60)
Glucose, Bld: 85 mg/dL (ref 70–99)
Potassium: 3.8 mmol/L (ref 3.5–5.1)
Sodium: 138 mmol/L (ref 135–145)
Total Bilirubin: 0.4 mg/dL (ref 0.0–1.2)
Total Protein: 7.1 g/dL (ref 6.5–8.1)

## 2024-08-24 NOTE — Progress Notes (Signed)
 Anesthesia Chart Review   Case: [id number] Date/Time: 08/29/24 1245   Procedure: LAPAROSCOPIC CHOLECYSTECTOMY - WITH ICG DYE   Anesthesia type: General   Pre-op diagnosis: SYMTOMATIC CHOLELITHIASIS   Location[facility] ROOM 01 / [facility]   Surgeons: Teresa Lonni HERO, MD       DISCUSSION:58 y.o. former smoker with h/o GERD, sarcoidosis, cryptogenic stroke status post loop recorder implantation (2018), symptomatic cholelithiasis scheduled for above procedure 08/29/2024 with Dr. Lonni Teresa.   Pt last seen by cardiology 07/26/2024. Per OV note, Patient presents for cardiovascular perioperative risk assessment before an upcoming laparoscopic cholecystectomy.  RCRI score is 2 given history of prior stroke indicating she is intermediate perioperative cardiovascular risk for an intermediate risk procedure.  She does not have any signs or symptoms of heart failure or angina and can perform greater than 4 METS.  She had a loop recorder from 2018-2022 that did not show any signs of atrial fibrillation.  At this point, we can plan to optimize her secondary prevention medications but she is otherwise optimized from a cardiovascular standpoint.  No further testing or treatment indicated.  VS: BP 115/89   Pulse 74   Temp 37.3 C (Oral)   Resp 16   Ht 5' 5 (1.651 m)   Wt 121.1 kg   SpO2 100%   BMI 44.43 kg/m   PROVIDERS: Dyane Anthony RAMAN, FNP is PCP   Cardiologist:  Caron Poser, MD  LABS: Labs reviewed: Acceptable for surgery. (all labs ordered are listed, but only abnormal results are displayed)  Labs Reviewed  CBC WITH DIFFERENTIAL/PLATELET - Abnormal; Notable for the following components:      Result Value   WBC 3.8 (*)    RBC 3.58 (*)    Hemoglobin 10.4 (*)    HCT 32.3 (*)    RDW 17.1 (*)    All other components within normal limits  COMPREHENSIVE METABOLIC PANEL WITH GFR - Abnormal; Notable for the following components:   Creatinine, Ser 1.51 (*)    Alkaline Phosphatase 155  (*)    GFR, Estimated 39 (*)    All other components within normal limits     IMAGES:   EKG:   CV: Echo 07/12/2017 - Left ventricle: Systolic function was normal. The estimated    ejection fraction was in the range of 55% to 60%. Wall motion was    normal; there were no regional wall motion abnormalities.  - Mitral valve: No evidence of vegetation.  - Left atrium: No evidence of thrombus in the atrial cavity or    appendage.  - Right atrium: No evidence of thrombus in the atrial cavity or    appendage.  - Atrial septum: No defect or patent foramen ovale was identified.  - Tricuspid valve: No evidence of vegetation.  - Pulmonic valve: No evidence of vegetation.  Past Medical History:  Diagnosis Date   Allergic rhinitis    Anxiety    Arthritis    Chronic kidney disease    stage 3   Depression    GERD (gastroesophageal reflux disease)    Goiter    Hyperlipidemia    Hypertension    no meds since wt loss   Sarcoidosis    Pulmonary, skin involvement   Stroke [facility])     Past Surgical History:  Procedure Laterality Date   ABDOMINAL HYSTERECTOMY     Fibroids   BREATH TEK H PYLORI N/A 01/29/2014   Procedure: BREATH TEK H PYLORI;  Surgeon: Alm VEAR Angle, MD;  Location: [facility];  Service: General;  Laterality: N/A;   LAPAROSCOPIC GASTRIC SLEEVE RESECTION N/A 06/04/2014   Procedure: LAPAROSCOPIC GASTRIC SLEEVE RESECTION;  Surgeon: Alm VEAR Angle, MD;  Location: [facility];  Service: General;  Laterality: N/A;   LOOP RECORDER INSERTION N/A 07/12/2017   Procedure: LOOP RECORDER INSERTION;  Surgeon: Kelsie Agent, MD;  Location: [facility];  Service: Cardiovascular;  Laterality: N/A;   OVARIAN CYST REMOVAL     PERCUTANEOUS PINNING Right 11/11/2015   Procedure: PERCUTANEOUS PINNING PROXIMAL PHALANX RIGHT SMALL FINGER  OPEN;  Surgeon: Arley Curia, MD;  Location: [facility];  Service: Orthopedics;  Laterality: Right;   TEE WITHOUT CARDIOVERSION N/A 07/12/2017    Procedure: TRANSESOPHAGEAL ECHOCARDIOGRAM (TEE);  Surgeon: Pietro Redell RAMAN, MD;  Location: [facility] ENDOSCOPY;  Service: Cardiovascular;  Laterality: N/A;   UPPER GI ENDOSCOPY  06/04/2014   Procedure: UPPER GI ENDOSCOPY;  Surgeon: Alm VEAR Angle, MD;  Location[facility];  Service: General;;    MEDICATIONS:  amLODipine (NORVASC) 5 MG tablet   aspirin  325 MG tablet   atorvastatin  (LIPITOR ) 80 MG tablet   Camphor-Menthol-Methyl Sal (SALONPAS) 3.10-16-08 % PTCH   carboxymethylcellulose (REFRESH PLUS) 0.5 % SOLN   escitalopram  (LEXAPRO ) 20 MG tablet   folic acid  (FOLVITE ) 1 MG tablet   hydroxychloroquine  (PLAQUENIL ) 200 MG tablet   methotrexate  (RHEUMATREX) 2.5 MG tablet   omeprazole (PRILOSEC) 20 MG capsule   sodium chloride  (BRONCHO SALINE ) inhaler solution   No current facility-administered medications for this encounter.    Harlene Hoots Ward, PA-C [facility] [phone]

## 2024-08-29 ENCOUNTER — Encounter (HOSPITAL_COMMUNITY)
Admission: RE | Disposition: A | Discharge status: Home / Self Care | Payer: Self-pay | Source: Home / Self Care | Attending: Surgery

## 2024-08-29 ENCOUNTER — Ambulatory Visit (HOSPITAL_COMMUNITY)
Admission: RE | Admit: 2024-08-29 | Discharge: 2024-08-29 | Disposition: A | Discharge status: Home / Self Care | Attending: Surgery | Admitting: Surgery

## 2024-08-29 ENCOUNTER — Ambulatory Visit (HOSPITAL_COMMUNITY): Admitting: Anesthesiology

## 2024-08-29 ENCOUNTER — Other Ambulatory Visit: Payer: Self-pay

## 2024-08-29 ENCOUNTER — Ambulatory Visit (HOSPITAL_COMMUNITY): Payer: Self-pay | Admitting: Physician Assistant

## 2024-08-29 ENCOUNTER — Encounter (HOSPITAL_COMMUNITY): Payer: Self-pay | Admitting: Surgery

## 2024-08-29 DIAGNOSIS — M1990 Unspecified osteoarthritis, unspecified site: Secondary | ICD-10-CM | POA: Insufficient documentation

## 2024-08-29 DIAGNOSIS — J45909 Unspecified asthma, uncomplicated: Secondary | ICD-10-CM | POA: Diagnosis not present

## 2024-08-29 DIAGNOSIS — Z01818 Encounter for other preprocedural examination: Secondary | ICD-10-CM

## 2024-08-29 DIAGNOSIS — K219 Gastro-esophageal reflux disease without esophagitis: Secondary | ICD-10-CM | POA: Diagnosis not present

## 2024-08-29 DIAGNOSIS — G4730 Sleep apnea, unspecified: Secondary | ICD-10-CM | POA: Insufficient documentation

## 2024-08-29 DIAGNOSIS — N183 Chronic kidney disease, stage 3 unspecified: Secondary | ICD-10-CM | POA: Diagnosis not present

## 2024-08-29 DIAGNOSIS — I10 Essential (primary) hypertension: Secondary | ICD-10-CM

## 2024-08-29 DIAGNOSIS — K8020 Calculus of gallbladder without cholecystitis without obstruction: Secondary | ICD-10-CM

## 2024-08-29 DIAGNOSIS — I129 Hypertensive chronic kidney disease with stage 1 through stage 4 chronic kidney disease, or unspecified chronic kidney disease: Secondary | ICD-10-CM | POA: Insufficient documentation

## 2024-08-29 DIAGNOSIS — Z87891 Personal history of nicotine dependence: Secondary | ICD-10-CM | POA: Diagnosis not present

## 2024-08-29 HISTORY — PX: CHOLECYSTECTOMY: SHX55

## 2024-08-29 SURGERY — LAPAROSCOPIC CHOLECYSTECTOMY
Anesthesia: General

## 2024-08-29 MED ORDER — BUPIVACAINE-EPINEPHRINE (PF) 0.25% -1:200000 IJ SOLN
INTRAMUSCULAR | Status: AC
  Filled 2024-08-29: qty 30

## 2024-08-29 MED ORDER — DROPERIDOL 2.5 MG/ML IJ SOLN: 0.6250 mg | Freq: Once | INTRAMUSCULAR | Status: DC | PRN

## 2024-08-29 MED ORDER — LACTATED RINGERS IV SOLN: INTRAVENOUS | Status: DC

## 2024-08-29 MED ORDER — MIDAZOLAM HCL 2 MG/2ML IJ SOLN
INTRAMUSCULAR | Status: AC
Start: 2024-08-29 — End: 2024-08-29
  Filled 2024-08-29: qty 2

## 2024-08-29 MED ORDER — PHENYLEPHRINE 80 MCG/ML (10ML) SYRINGE FOR IV PUSH (FOR BLOOD PRESSURE SUPPORT)
PREFILLED_SYRINGE | INTRAVENOUS | Status: DC | PRN
  Administered 2024-08-29: 80 ug via INTRAVENOUS

## 2024-08-29 MED ORDER — LIDOCAINE HCL (PF) 2 % IJ SOLN
INTRAMUSCULAR | Status: AC
Start: 2024-08-29 — End: 2024-08-29
  Filled 2024-08-29: qty 5

## 2024-08-29 MED ORDER — SCOPOLAMINE 1 MG/3DAYS TD PT72
1.0000 | MEDICATED_PATCH | TRANSDERMAL | Status: DC
  Administered 2024-08-29: 1 mg via TRANSDERMAL
  Filled 2024-08-29: qty 1

## 2024-08-29 MED ORDER — PROPOFOL 10 MG/ML IV BOLUS
INTRAVENOUS | Status: DC | PRN
  Administered 2024-08-29 (×2): 30 mg via INTRAVENOUS
  Administered 2024-08-29: 140 mg via INTRAVENOUS

## 2024-08-29 MED ORDER — LACTATED RINGERS IR SOLN
Status: DC | PRN
  Administered 2024-08-29: 1000 mL

## 2024-08-29 MED ORDER — CEFAZOLIN SODIUM-DEXTROSE 3-4 GM/150ML-% IV SOLN
3.0000 g | INTRAVENOUS | Status: AC
  Administered 2024-08-29: 3 g via INTRAVENOUS
  Filled 2024-08-29: qty 150

## 2024-08-29 MED ORDER — ONDANSETRON HCL 4 MG/2ML IJ SOLN
INTRAMUSCULAR | Status: AC
  Filled 2024-08-29: qty 2

## 2024-08-29 MED ORDER — CHLORHEXIDINE GLUCONATE 0.12 % MT SOLN
15.0000 mL | Freq: Once | OROMUCOSAL | Status: AC
  Administered 2024-08-29: 15 mL via OROMUCOSAL

## 2024-08-29 MED ORDER — LIDOCAINE HCL (CARDIAC) PF 100 MG/5ML IV SOSY
PREFILLED_SYRINGE | INTRAVENOUS | Status: DC | PRN
  Administered 2024-08-29: 100 mg via INTRAVENOUS

## 2024-08-29 MED ORDER — DEXAMETHASONE SOD PHOSPHATE PF 10 MG/ML IJ SOLN
INTRAMUSCULAR | Status: DC | PRN
  Administered 2024-08-29: 10 mg via INTRAVENOUS

## 2024-08-29 MED ORDER — SUGAMMADEX SODIUM 200 MG/2ML IV SOLN
INTRAVENOUS | Status: DC | PRN
  Administered 2024-08-29: 400 mg via INTRAVENOUS

## 2024-08-29 MED ORDER — ORAL CARE MOUTH RINSE: 15.0000 mL | Freq: Once | OROMUCOSAL | Status: AC

## 2024-08-29 MED ORDER — OXYCODONE HCL 5 MG/5ML PO SOLN: 5.0000 mg | Freq: Once | ORAL | Status: DC | PRN

## 2024-08-29 MED ORDER — STERILE WATER FOR IRRIGATION IR SOLN
Status: DC | PRN
  Administered 2024-08-29: 1000 mL

## 2024-08-29 MED ORDER — TRAMADOL HCL 50 MG PO TABS: 50.0000 mg | ORAL_TABLET | Freq: Four times a day (QID) | ORAL | 0 refills | Status: AC | PRN

## 2024-08-29 MED ORDER — 0.9 % SODIUM CHLORIDE (POUR BTL) OPTIME
TOPICAL | Status: DC | PRN
  Administered 2024-08-29: 1000 mL

## 2024-08-29 MED ORDER — FENTANYL CITRATE (PF) 100 MCG/2ML IJ SOLN
INTRAMUSCULAR | Status: AC
  Filled 2024-08-29: qty 2

## 2024-08-29 MED ORDER — FENTANYL CITRATE (PF) 100 MCG/2ML IJ SOLN
INTRAMUSCULAR | Status: DC | PRN
  Administered 2024-08-29: 50 ug via INTRAVENOUS
  Administered 2024-08-29: 100 ug via INTRAVENOUS
  Administered 2024-08-29: 50 ug via INTRAVENOUS

## 2024-08-29 MED ORDER — CHLORHEXIDINE GLUCONATE CLOTH 2 % EX PADS: 6.0000 | MEDICATED_PAD | Freq: Once | CUTANEOUS | Status: DC

## 2024-08-29 MED ORDER — ONDANSETRON HCL 4 MG/2ML IJ SOLN
INTRAMUSCULAR | Status: DC | PRN
  Administered 2024-08-29: 4 mg via INTRAVENOUS

## 2024-08-29 MED ORDER — ACETAMINOPHEN 500 MG PO TABS: 1000.0000 mg | ORAL_TABLET | Freq: Once | ORAL | Status: DC

## 2024-08-29 MED ORDER — GLYCOPYRROLATE 0.2 MG/ML IJ SOLN
INTRAMUSCULAR | Status: DC | PRN
  Administered 2024-08-29: .2 mg via INTRAVENOUS

## 2024-08-29 MED ORDER — FENTANYL CITRATE (PF) 50 MCG/ML IJ SOSY: 25.0000 ug | PREFILLED_SYRINGE | INTRAMUSCULAR | Status: DC | PRN

## 2024-08-29 MED ORDER — ROCURONIUM BROMIDE 100 MG/10ML IV SOLN
INTRAVENOUS | Status: DC | PRN
  Administered 2024-08-29: 50 mg via INTRAVENOUS

## 2024-08-29 MED ORDER — OXYCODONE HCL 5 MG PO TABS: 5.0000 mg | ORAL_TABLET | Freq: Once | ORAL | Status: DC | PRN

## 2024-08-29 MED ORDER — INDOCYANINE GREEN 25 MG IV SOLR
2.5000 mg | Freq: Once | INTRAVENOUS | Status: AC
  Administered 2024-08-29: 2.5 mg via INTRAVENOUS
  Filled 2024-08-29: qty 10

## 2024-08-29 MED ORDER — ACETAMINOPHEN 500 MG PO TABS
1000.0000 mg | ORAL_TABLET | ORAL | Status: AC
  Administered 2024-08-29: 1000 mg via ORAL
  Filled 2024-08-29: qty 2

## 2024-08-29 MED ORDER — MIDAZOLAM HCL 5 MG/5ML IJ SOLN
INTRAMUSCULAR | Status: DC | PRN
  Administered 2024-08-29: 2 mg via INTRAVENOUS

## 2024-08-29 MED ORDER — ACETAMINOPHEN 10 MG/ML IV SOLN: 1000.0000 mg | Freq: Once | INTRAVENOUS | Status: DC | PRN

## 2024-08-29 MED ORDER — LACTATED RINGERS IV SOLN: INTRAVENOUS | Status: DC | PRN

## 2024-08-29 MED ORDER — ROCURONIUM BROMIDE 10 MG/ML (PF) SYRINGE
PREFILLED_SYRINGE | INTRAVENOUS | Status: AC
  Filled 2024-08-29: qty 10

## 2024-08-29 MED ORDER — SUGAMMADEX SODIUM 200 MG/2ML IV SOLN
INTRAVENOUS | Status: AC
  Filled 2024-08-29: qty 4

## 2024-08-29 MED ORDER — BUPIVACAINE-EPINEPHRINE 0.25% -1:200000 IJ SOLN
INTRAMUSCULAR | Status: DC | PRN
  Administered 2024-08-29: 15 mL

## 2024-08-29 MED ORDER — EPHEDRINE SULFATE (PRESSORS) 25 MG/5ML IV SOSY
PREFILLED_SYRINGE | INTRAVENOUS | Status: DC | PRN
  Administered 2024-08-29: 10 mg via INTRAVENOUS

## 2024-08-29 SURGICAL SUPPLY — 38 items
APPLICATOR ARISTA FLEXITIP XL (MISCELLANEOUS) IMPLANT
BAG COUNTER SPONGE SURGICOUNT (BAG) IMPLANT
CHLORAPREP W/TINT 26 (MISCELLANEOUS) ×1 IMPLANT
CLIP APPLIE 5 13 M/L LIGAMAX5 (MISCELLANEOUS) ×1 IMPLANT
CLIP APPLIE ROT 10 11.4 M/L (STAPLE) IMPLANT
COVER MAYO STAND XLG (MISCELLANEOUS) ×1 IMPLANT
COVER SURGICAL LIGHT HANDLE (MISCELLANEOUS) ×1 IMPLANT
DERMABOND ADVANCED .7 DNX12 (GAUZE/BANDAGES/DRESSINGS) ×1 IMPLANT
DISSECTOR BLUNT TIP ENDO 5MM (MISCELLANEOUS) IMPLANT
DRAPE C-ARM 42X120 X-RAY (DRAPES) IMPLANT
ELECT PENCIL ROCKER SW 15FT (MISCELLANEOUS) ×1 IMPLANT
ELECT REM PT RETURN 15FT ADLT (MISCELLANEOUS) ×1 IMPLANT
ENDOLOOP SUT PDS II 0 18 (SUTURE) IMPLANT
GLOVE BIO SURGEON STRL SZ7.5 (GLOVE) ×1 IMPLANT
GLOVE INDICATOR 8.0 STRL GRN (GLOVE) ×1 IMPLANT
GOWN STRL REUS W/ TWL XL LVL3 (GOWN DISPOSABLE) ×2 IMPLANT
GRASPER SUT TROCAR 14GX15 (MISCELLANEOUS) IMPLANT
HEMOSTAT ARISTA ABSORB 3G PWDR (HEMOSTASIS) IMPLANT
HEMOSTAT SNOW SURGICEL 2X4 (HEMOSTASIS) IMPLANT
IRRIGATION SUCT STRKRFLW 2 WTP (MISCELLANEOUS) ×1 IMPLANT
KIT BASIN OR (CUSTOM PROCEDURE TRAY) ×1 IMPLANT
KIT IMAGING PINPOINTPAQ (MISCELLANEOUS) IMPLANT
KIT TURNOVER KIT A (KITS) ×1 IMPLANT
NDL INSUFFLATION 14GA 120MM (NEEDLE) IMPLANT
NEEDLE INSUFFLATION 14GA 120MM (NEEDLE) IMPLANT
SCISSORS LAP 5X35 DISP (ENDOMECHANICALS) ×1 IMPLANT
SET CHOLANGIOGRAPH MIX (MISCELLANEOUS) IMPLANT
SET TUBE SMOKE EVAC HIGH FLOW (TUBING) ×1 IMPLANT
SLEEVE ADV FIXATION 5X100MM (TROCAR) ×2 IMPLANT
SPIKE FLUID TRANSFER (MISCELLANEOUS) ×1 IMPLANT
SUT MNCRL AB 4-0 PS2 18 (SUTURE) ×1 IMPLANT
SUT VICRYL 0 UR6 27IN ABS (SUTURE) ×1 IMPLANT
SYR 20ML ECCENTRIC (SYRINGE) ×1 IMPLANT
SYSTEM BAG RETRIEVAL 10MM (BASKET) ×1 IMPLANT
TOWEL OR 17X26 10 PK STRL BLUE (TOWEL DISPOSABLE) ×1 IMPLANT
TRAY LAPAROSCOPIC (CUSTOM PROCEDURE TRAY) ×1 IMPLANT
TROCAR ADV FIXATION 5X100MM (TROCAR) ×1 IMPLANT
TROCAR BALLN 12MMX100 BLUNT (TROCAR) ×1 IMPLANT

## 2024-08-29 NOTE — Transfer of Care (Signed)
 Immediate Anesthesia Transfer of Care Note  Patient: [name]  Procedure(s) Performed: LAPAROSCOPIC CHOLECYSTECTOMY WITH ICG DYE  Patient Location: PACU  Anesthesia Type:General  Level of Consciousness: awake and alert   Airway & Oxygen Therapy: Patient Spontanous Breathing and Patient connected to face mask oxygen  Post-op Assessment: Report given to RN and Post -op Vital signs reviewed and stable  Post vital signs: Reviewed and stable  Last Vitals:  Vitals Value Taken Time  BP 158/89 08/29/24 14:03  Temp    Pulse 95 08/29/24 14:07  Resp 13 08/29/24 14:07  SpO2 100 % 08/29/24 14:07  Vitals shown include unfiled device data.  Last Pain:  Vitals:   08/29/24 1112  TempSrc:   PainSc: 0-No pain      Patients Stated Pain Goal: 2 (08/29/24 1112)  Complications: No notable events documented.

## 2024-08-29 NOTE — Anesthesia Preprocedure Evaluation (Addendum)
 Anesthesia Evaluation  Patient identified by MRN, date of birth, ID band Patient awake    Reviewed: Allergy & Precautions, NPO status , Patient's Chart, lab work & pertinent test results  Airway Mallampati: I  TM Distance: >3 FB Neck ROM: Full    Dental  (+) Missing, Dental Advisory Given,    Pulmonary asthma , sleep apnea , former smoker   breath sounds clear to auscultation       Cardiovascular hypertension, Pt. on medications  Rhythm:Regular Rate:Normal     Neuro/Psych  PSYCHIATRIC DISORDERS Anxiety Depression    CVA    GI/Hepatic Neg liver ROS,GERD  Medicated,,  Endo/Other  negative endocrine ROS    Renal/GU Renal InsufficiencyRenal disease     Musculoskeletal  (+) Arthritis ,    Abdominal   Peds  Hematology  (+) Blood dyscrasia, anemia   Anesthesia Other Findings   Reproductive/Obstetrics                              Anesthesia Physical Anesthesia Plan  ASA: 3  Anesthesia Plan: General   Post-op Pain Management: Tylenol  PO (pre-op)* and Toradol IV (intra-op)*   Induction: Intravenous  PONV Risk Score and Plan: 4 or greater and Ondansetron , Dexamethasone , Midazolam  and Scopolamine  patch - Pre-op  Airway Management Planned: Oral ETT  Additional Equipment: None  Intra-op Plan:   Post-operative Plan: Extubation in OR  Informed Consent: I have reviewed the patients History and Physical, chart, labs and discussed the procedure including the risks, benefits and alternatives for the proposed anesthesia with the patient or authorized representative who has indicated his/her understanding and acceptance.     Dental advisory given  Plan Discussed with: CRNA  Anesthesia Plan Comments:          Anesthesia Quick Evaluation

## 2024-08-29 NOTE — Anesthesia Postprocedure Evaluation (Signed)
 Anesthesia Post Note  Patient: [name]  Procedure(s) Performed: LAPAROSCOPIC CHOLECYSTECTOMY WITH ICG DYE     Patient location during evaluation: PACU Anesthesia Type: General Level of consciousness: awake and alert Pain management: pain level controlled Vital Signs Assessment: post-procedure vital signs reviewed and stable Respiratory status: spontaneous breathing, nonlabored ventilation, respiratory function stable and patient connected to nasal cannula oxygen Cardiovascular status: blood pressure returned to baseline and stable Postop Assessment: no apparent nausea or vomiting Anesthetic complications: no   No notable events documented.  Last Vitals:  Vitals:   08/29/24 1500 08/29/24 1515  BP: (!) 165/84 (!) 152/86  Pulse: 78 78  Resp: 16 15  Temp: (!) 36.3 C (!) 36.3 C  SpO2: 95% 98%    Last Pain:  Vitals:   08/29/24 1515  TempSrc:   PainSc: 0-No pain                 Franky JONETTA Bald

## 2024-08-29 NOTE — Discharge Instructions (Addendum)
POST OP INSTRUCTIONS  DIET: As tolerated. Follow a light bland diet the first 24 hours after arrival home, such as soup, liquids, crackers, etc.  Be sure to include lots of fluids daily.  Avoid fast food or heavy meals as your are more likely to get nauseated.  Eat a low fat the next few days after surgery.  Take your usually prescribed home medications unless otherwise directed.  PAIN CONTROL: Pain is best controlled by a usual combination of three different methods TOGETHER: Ice/Heat Over the counter pain medication Prescription pain medication Most patients will experience some swelling and bruising around the surgical site.  Ice packs or heating pads (30-60 minutes up to 6 times a day) will help. Some people prefer to use ice alone, heat alone, alternating between ice & heat.  Experiment to what works for you.  Swelling and bruising can take several weeks to resolve.   It is helpful to take an over-the-counter pain medication regularly for the first few weeks: Ibuprofen (Motrin/Advil) - 200mg tabs - take 3 tabs (600mg) every 6 hours as needed for pain Acetaminophen (Tylenol) - you may take 650mg every 6 hours as needed. You can take this with motrin as they act differently on the body. If you are taking a narcotic pain medication that has acetaminophen in it, do not take over the counter tylenol at the same time.  Iii. NOTE: You may take both of these medications together - most patients  find it most helpful when alternating between the two (i.e. Ibuprofen at 6am,  tylenol at 9am, ibuprofen at 12pm ...) A  prescription for pain medication should be given to you upon discharge.  Take your pain medication as prescribed if your pain is not adequatly controlled with the over-the-counter pain reliefs mentioned above.  Avoid getting constipated.  Between the surgery and the pain medications, it is common to experience some constipation.  Increasing fluid intake and taking a fiber supplement (such as  Metamucil, Citrucel, FiberCon, MiraLax, etc) 1-2 times a day regularly will usually help prevent this problem from occurring.  A mild laxative (prune juice, Milk of Magnesia, MiraLax, etc) should be taken according to package directions if there are no bowel movements after 48 hours.    Dressing: Your incisions are covered in Dermabond which is like sterile superglue for the skin. This will come off on it's own in a couple weeks. It is waterproof and you may bathe normally starting the day after your surgery in a shower. Avoid baths/pools/lakes/oceans until your wounds have fully healed.  ACTIVITIES as tolerated:   Avoid heavy lifting (>10lbs or 1 gallon of milk) for the next 6 weeks. You may resume regular (light) daily activities beginning the next day--such as daily self-care, walking, climbing stairs--gradually increasing activities as tolerated.  If you can walk 30 minutes without difficulty, it is safe to try more intense activity such as jogging, treadmill, bicycling, low-impact aerobics.  DO NOT PUSH THROUGH PAIN.  Let pain be your guide: If it hurts to do something, don't do it. You may drive when you are no longer taking prescription pain medication, you can comfortably wear a seatbelt, and you can safely maneuver your car and apply brakes.   FOLLOW UP in our office Please call [facility] at [phone] to set up an appointment to see your surgeon in the office for a follow-up appointment approximately 2 weeks after your surgery. Make sure that you call for this appointment the day you arrive home to   insure a convenient appointment time.  9. If you have disability or family leave forms that need to be completed, you may have them completed by your primary care physician's office; for return to work instructions, please ask our office staff and they will be happy to assist you in obtaining this documentation   When to call us [phone]: Poor pain control Reactions / problems with new  medications (rash/itching, etc)  Fever over 101.5 F (38.5 C) Inability to urinate Nausea/vomiting Worsening swelling or bruising Continued bleeding from incision. Increased pain, redness, or drainage from the incision  The clinic staff is available to answer your questions during regular business hours (8:30am-5pm).  Please don't hesitate to call and ask to speak to one of our nurses for clinical concerns.   A surgeon from [facility] is always on call at the hospitals   If you have a medical emergency, go to the nearest emergency room or call 911.  [facility] [address], [address], [place], [place]  [address] MAIN: [phone] FAX: [phone] [email or web address] 

## 2024-08-29 NOTE — H&P (Signed)
 CC: Here today for surgery  HPI: [name] is an 58 y.o. female with history of sarcoidosis, hypertension, hyperlipidemia, T2DM, hx of CVA, GERD, OSA, anxiety, depression and obesity , whom is seen in the office today as a referral by Dr. Dyane for evaluation of symptomatic cholelithiasis.   RUQ US  05/24/24  -Cholelithiasis without sonographic evidence of acute cholecystitis; CBD 3.3 mm; large gallstone 5.8 mm. No wall thickening.  Last LFTs 03/2022 - normal  She reports 2 bouts notable of fairly severe right upper quadrant radiating to mid epigastric abdominal pain. The pain is described as a sharp deep boring cramp. It was unremitting and lasted for a few hours. This first occurred approximately January, 2025. She has subsequent attack very similar to this in August of this year. She does attribute both of these attacks to occurring after having had a particular greasy/fatty meal and starting approximately 45 to 60 minutes after the meal. She denies any history of associated fever but did have associated nausea. She now lives at a fear of another 1 of these attacks because this was a pretty significant impact when it did occur.  She denies any changes in health or health history since we met in the office. No new medications/allergies. She states she is ready for surgery today.   PMH: sarcoidosis, hypertension, hyperlipidemia, T2DM, hx of CVA, GERD, OSA, anxiety, depression and obesity   PSH: Sleeve gastrectomy (05/2014, Dr. Ethyl); fibroid removed-1998; fibroid removed-2000; partial hysterectomy-left ovary-2006  FHx: Denies any known family history of colorectal, breast, endometrial or ovarian cancer  Social Hx: Denies use of tobacco/EtOH/illicit drug. She is here today by herself. 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    Past Medical History:  Diagnosis Date   Allergic rhinitis    Anxiety    Arthritis    Chronic kidney disease    stage 3    Depression    GERD (gastroesophageal reflux disease)    Goiter    Hyperlipidemia    Hypertension    no meds since wt loss   Sarcoidosis    Pulmonary, skin involvement   Stroke [facility])     Past Surgical History:  Procedure Laterality Date   ABDOMINAL HYSTERECTOMY     Fibroids   BREATH TEK H PYLORI N/A 01/29/2014   Procedure: BREATH TEK H PYLORI;  Surgeon: Alm VEAR Ethyl, MD;  Location: [facility];  Service: General;  Laterality: N/A;   LAPAROSCOPIC GASTRIC SLEEVE RESECTION N/A 06/04/2014   Procedure: LAPAROSCOPIC GASTRIC SLEEVE RESECTION;  Surgeon: Alm VEAR Ethyl, MD;  Location: [facility];  Service: General;  Laterality: N/A;   LOOP RECORDER INSERTION N/A 07/12/2017   Procedure: LOOP RECORDER INSERTION;  Surgeon: Kelsie Agent, MD;  Location: [facility];  Service: Cardiovascular;  Laterality: N/A;   OVARIAN CYST REMOVAL     PERCUTANEOUS PINNING Right 11/11/2015   Procedure: PERCUTANEOUS PINNING PROXIMAL PHALANX RIGHT SMALL FINGER  OPEN;  Surgeon: Arley Curia, MD;  Location: [facility];  Service: Orthopedics;  Laterality: Right;   TEE WITHOUT CARDIOVERSION N/A 07/12/2017   Procedure: TRANSESOPHAGEAL ECHOCARDIOGRAM (TEE);  Surgeon: Pietro Redell RAMAN, MD;  Location: [facility];  Service: Cardiovascular;  Laterality: N/A;   UPPER GI ENDOSCOPY  06/04/2014   Procedure: UPPER GI ENDOSCOPY;  Surgeon: Alm VEAR Ethyl, MD;  Location: [facility];  Service: General;;    Family History  Problem Relation Age of Onset   Heart disease Mother    Kidney  disease Father    Sarcoidosis Brother    Prostate cancer Brother    Sarcoidosis Other        niece   Hyperlipidemia Other    Hypertension Other    Stroke Other    Obesity Other    Sarcoidosis Brother     Social:  reports that she quit smoking about 27 years ago. Her smoking use included cigarettes. She started smoking about 39 years ago. She has a 6 pack-year smoking history. She has quit using smokeless tobacco.  Her smokeless  tobacco use included chew and snuff. She reports that she does not drink alcohol and does not use drugs.  Allergies:  Allergies  Allergen Reactions   Ibuprofen Itching    Medications: I have reviewed the patient's current medications.  No results found for this or any previous visit (from the past 48 hours).  No results found.   PE Blood pressure (!) 159/93, pulse 87, temperature 99.2 F (37.3 C), temperature source Oral, resp. rate 18, height 5' 5 (1.651 m), weight 121.1 kg, SpO2 99%. Constitutional: NAD; conversant Eyes: Moist conjunctiva; no lid lag; anicteric Lungs: Normal respiratory effort CV: RRR GI: Abd obese, soft, NT/ND; no palpable hepatosplenomegaly Psychiatric: Appropriate affect  No results found for this or any previous visit (from the past 48 hours).  No results found.  A/P: [name] is an 58 y.o. female with hx of sarcoidosis, hypertension, hyperlipidemia, T2DM, hx of CVA, GERD, OSA, anxiety, depression and obesity here for evaluation of symptomatic cholelithiasis  - Cardiac clearance prior to surgery  - The anatomy and physiology of the hepatobiliary system was discussed with her with associated pictures. We also spent time reviewing the pathophysiology of gallbladder disease -laparoscopic cholecystectomy handbook, publisher Krames.  -The options for treatment were discussed including ongoing observation which carries some risk of subsequent gallbladder complications (infection, pancreatitis, choledocholithiasis, etc). We reviewed surgery as the most definitive treatment option moving forward - laparoscopic cholecystectomy with indocyanine green cholangiography  -The planned procedure, material risks (including, but not limited to, pain, bleeding, infection, scarring, need for blood transfusion, damage to surrounding structures- blood vessels/nerves/viscus/organs, damage to bile duct, bile leak, chronic diarrhea, conversion to a 'subtotal' cholecystectomy  and general expectations therein, post cholecystectomy diarrhea, need for additional procedures, hernia, worsening of pre-existing medical conditions, pancreatitis, blood clot, pulmonary embolus pneumonia, heart attack, stroke, death) benefits and alternatives to surgery were discussed at length. I noted a good probability that the procedure would help improve her symptoms. The patient's questions were answered to her satisfaction, she voiced understanding and elected to proceed with surgery. Additionally, we discussed typical postoperative expectations and the recovery process.   Lonni Pizza, MD [facility], [facility]

## 2024-08-29 NOTE — Anesthesia Procedure Notes (Addendum)
 Procedure Name: Intubation Date/Time: 08/29/2024 12:45 PM  Performed by: Tilford Franky BIRCH, MDPre-anesthesia Checklist: Patient identified, Emergency Drugs available, Suction available, Patient being monitored and Timeout performed Patient Re-evaluated:Patient Re-evaluated prior to induction Oxygen Delivery Method: Circle system utilized Preoxygenation: Pre-oxygenation with 100% oxygen Induction Type: IV induction Ventilation: Mask ventilation without difficulty Laryngoscope Size: Glidescope and 3 Grade View: Grade I Tube type: Oral Tube size: 6.0 mm Number of attempts: 3 Placement Confirmation: positive ETCO2, ETT inserted through vocal cords under direct vision, CO2 detector and breath sounds checked- equal and bilateral Secured at: 21 cm Tube secured with: Tape Dental Injury: Teeth and Oropharynx as per pre-operative assessment  Difficulty Due To: Difficulty was unanticipated Comments: CRNA DL x1, Grade 1 view, unable to pass 7.0 OETT. MDA DL x1, Grade 1 view but appears to narrowing above the vocal cords.   Glidescope 3, significant scar tissue and narrowing at the glottic open with normal vocal cords. 6.0 OETT passed atraumatically but 6.5 OETT unlikely to pass.   Recommend Glidescope and reduced size OETT in the future.

## 2024-08-29 NOTE — Op Note (Signed)
 08/29/2024 1:50 PM  PATIENT: [name]  58 y.o. female  Patient Care Team: Dyane Anthony RAMAN, FNP as PCP - General (Family Medicine) Argentina Clap, MD as PCP - Cardiology (Cardiology) Shellia Oh, MD as Consulting Physician (Pulmonary Disease) Everlean Fallow, MD as Consulting Physician (Rheumatology) Lyn Loving, PhD as Consulting Physician (Psychology)  PRE-OPERATIVE DIAGNOSIS: Symptomatic cholelithiasis  POST-OPERATIVE DIAGNOSIS: Same  PROCEDURE: Laparoscopic cholecystectomy with indocyanine green cholangiography  SURGEON: Lonni Pizza, MD  ASSISTANT: Margretta Brash MD  ANESTHESIA: General endotracheal  EBL: 5 mL  DRAINS: None  SPECIMEN: Gallbladder  COUNTS: Sponge, needle and instrument counts were reported correct x2 at the conclusion of the operation  DISPOSITION: PACU in satisfactory condition  COMPLICATIONS: none  FINDINGS: Relatively normal-appearing liver and gallbladder.  ICG cholangiography demonstrates uptake by the liver and excretion into the biliary system with faint tracer activity seen in the hepatoduodenal ligament along the expected trajectory of the common bile duct.  Tracer activity also seen to the wall of the duodenum consistent with a patent biliary system.  Gallbladder did fill nicely with ICG as well.  DESCRIPTION:  The patient was seen in the pre-op holding area. The risks, benefits, complications, treatment options, and expected outcomes were previously discussed with the patient. The patient agreed with the proposed plan and has signed the informed consent form. The patient was brought to the operating room by the surgical team, identified as [name], and the procedure verified. placed supine on the operating table and SCD's were applied. General anesthesia was induced without difficulty.  She was positioned supine on the operating table.  Pressure points were evaluated and padded.  A foley catheter was then placed by nursing under  sterile conditions. Hair on the abdomen was clipped.  She was secured to the operating table. The abdomen was then prepped and draped in the standard sterile fashion. A time out was completed and the above information confirmed and need for preoperative antibiotics.  A periumbilical incision was made. The umbilical stalk was grasped and retracted outwardly. The supraumbilical fascia was identified and incised. The peritoneal cavity was gently entered bluntly. A purse-string 0 Vicryl suture was placed. The Hasson cannula was inserted into the peritoneal cavity and insufflation with CO2 commenced to . A laparoscope was inserted into the peritoneal cavity and inspection confirmed no evidence of trocar site complications. The patient was then positioned in reverse Trendelenburg with slight left side down. 3 additional 5mm trocars were placed along the right subcostal line - one 5mm port in mid subcostal region, another 5mm port in the right flank near the anterior axillary line, and a third 5mm port in the left subxiphoid region obliquely near the falciform ligament.  The liver and gallbladder were inspected.  Gallbladder is relatively normal in appearance as is her liver. The gallbladder fundus was grasped and elevated cephalad. An additional grasper was then placed on the infundibulum of the gallbladder and the infundibulum was retracted laterally. Staying high on the gallbladder, the peritoneum on both sides of the gallbladder was opened with hook cautery. Gentle blunt dissection was then employed with a [place]  dissector working down into [employer]. The cystic duct was identified and carefully circumferentially dissected. The cystic artery was also identified and carefully circumferentially dissected. The space between the cystic artery and hepatocystic plate was developed such that a good view of the liver could be seen through a window medial to the cystic artery. The triangle of Calot had been  cleared of all fibrofatty  tissue. At this point, a critical view of safety was achieved and the only structures visualized was the skeletonized cystic duct laterally, the skeletonized cystic artery and the liver through the window medial to the artery.  A diminutive anterior cystic artery was noted coursing around the cystic duct, approximately 1 mm in size.  ICG cholangiography does demonstrate uptake by the liver and excretion into the biliary system.  Faint tracer activity seen through the wall of the duodenum consistent with a patent biliary system.  There is also uptake seen through the hepatoduodenal ligament consistent with the expected trajectory of the common bile duct as well as the common hepatic duct.  ICG fills the cystic duct and gallbladder.  There is no ICG activity seen within our candidate cystic artery.  The cystic duct and artery were clipped with 2 clips on the patient side and 1 clip on the specimen side.  The diminutive anterior division of the cystic artery that wraps around the cystic duct was also controlled with 2 clips on the patient's side.  The cystic duct and artery were then divided. The gallbladder was then freed from its remaining attachments to the liver using electrocautery and placed into an endocatch bag. The RUQ was gently irrigated with sterile saline. Hemostasis was then verified. The clips were in good position; the gallbladder fossa was dry. The rest of the abdomen was inspected no injury nor bleeding elsewhere was identified.  The endocatch bag containing the gallbladder was then removed from the umbilical port site and passed off as specimen. The RUQ ports were removed under direct visualization and noted to be hemostatic. The umbilical fascia was then closed using the 0 Vicryl purse-string suture. The fascia was palpated and noted to be completely closed. The skin of all incision sites was approximated with 4-0 monocryl subcuticular suture and dermabond applied.  She was then awakened from anesthesia, extubated, and transferred to a stretcher for transport to PACU in satisfactory condition.

## 2024-08-30 ENCOUNTER — Ambulatory Visit: Admitting: Pharmacist

## 2024-08-30 ENCOUNTER — Encounter (HOSPITAL_COMMUNITY): Payer: Self-pay | Admitting: Surgery

## 2024-08-30 LAB — SURGICAL PATHOLOGY

## 2024-09-11 ENCOUNTER — Ambulatory Visit: Admitting: Pharmacist

## 2024-09-17 NOTE — Progress Notes (Unsigned)
  Electrophysiology Office Note:   Date:  09/18/2024  ID:  [name], DOB [birth year], MRN [id number]  Primary Cardiologist: Caron Poser, MD Electrophysiologist: None      History of Present Illness:   [name] is a 58 y.o. female with h/o  PMH HLD, HTN, DM 2, and history of prior cryptogenic stroke status post loop recorder implantation (2018) who is being seen today for loop explant at the request of Dr. Poser.  Discussed the use of AI scribe software for clinical note transcription with the patient, who gave verbal consent to proceed.  History of Present Illness [name] is a 58 year old female who presents for removal of a loop recorder with a dead battery.  A loop recorder was implanted several years ago by an unknown provider, someone at [facility] The device's battery is now dead, and she seeks its removal. The device is not painful, but she can feel it under her skin.  She experiences occasional sensations of her heart pounding, racing, or fluttering. These episodes are brief, lasting seconds. No prolonged episodes of heart pounding, racing, or fluttering. No new or acute complaints.  Review of systems complete and found to be negative unless listed in HPI.   EP Information / Studies Reviewed:    EKG is not ordered today. EKG from 07/26/24 reviewed which showed normal sinus rhythm with RBBB.      Echo 06/28/24: Study Conclusions  - Left ventricle: The cavity size was normal. Systolic function was    normal. The estimated ejection fraction was in the range of 55%    to 60%. Wall motion was normal; there were no regional wall    motion abnormalities. Left ventricular diastolic function    parameters were normal.       Physical Exam:   VS:  BP 110/78 (BP Location: Left Arm, Patient Position: Sitting, Cuff Size: Large)   Pulse 87   Ht 5' 5 (1.651 m)   Wt 273 lb 4 oz (123.9 kg)   SpO2 97%   BMI 45.47 kg/m    Wt Readings from Last 3 Encounters:  09/18/24 273 lb  4 oz (123.9 kg)  08/29/24 267 lb (121.1 kg)  08/24/24 267 lb (121.1 kg)     General: Well developed, in no acute distress.  Neck: No JVD.  Cardiac: Normal rate, regular rhythm.  Resp: Normal work of breathing.  Ext: No edema.  Neuro: No gross focal deficits.  Psych: Normal affect.   ASSESSMENT AND PLAN:    #Cryptogenic stroke s/p loop recorder: - Patient's loop recorder has been EOL for some time. She wants it removed. Unable to palpate device in clinic. Due to body habitus, recommend loop explant with assistance of fluoroscopy. We will arrange to this to be done in the EP lab. Explained risks, benefits, and alternatives to ILR explant, patient voiced understanding and elected to proceed.   Follow up with Dr. Kennyth for ILR explant.   Signed, Fonda Kennyth, MD

## 2024-09-18 ENCOUNTER — Encounter: Payer: Self-pay | Admitting: Cardiology

## 2024-09-18 ENCOUNTER — Ambulatory Visit: Attending: Cardiology | Admitting: Cardiology

## 2024-09-18 VITALS — BP 110/78 | HR 87 | Ht 65.0 in | Wt 273.2 lb

## 2024-09-18 DIAGNOSIS — I639 Cerebral infarction, unspecified: Secondary | ICD-10-CM

## 2024-09-18 DIAGNOSIS — Z95818 Presence of other cardiac implants and grafts: Secondary | ICD-10-CM

## 2024-09-18 NOTE — Patient Instructions (Signed)
 Medication Instructions:  Your physician recommends that you continue on your current medications as directed. Please refer to the Current Medication list given to you today.  *If you need a refill on your cardiac medications before your next appointment, please call your pharmacy*  Testing/Procedures: Loop Recorder Explant  - January 19th at [facility]- we will call you to let you know what time to arrive. There are no restrictions or special instructions prior to this visit. Please note that you will not be able to shower for 72 hours after the procedure.    Follow-Up: At [facility], you and your health needs are our priority.  As part of our continuing mission to provide you with exceptional heart care, our providers are all part of one team.  This team includes your primary Cardiologist (physician) and Advanced Practice Providers or APPs (Physician Assistants and Nurse Practitioners) who all work together to provide you with the care you need, when you need it.

## 2024-10-10 ENCOUNTER — Telehealth: Payer: Self-pay

## 2024-10-10 NOTE — Telephone Encounter (Signed)
 LM informing pt of time change for her Loop Removal procedure with Dr. Kennyth at [facility] on 1/19. She will need to arrive at 130pm.  I left my direct number for her to call and let me know that she received my message.

## 2024-10-12 ENCOUNTER — Telehealth (HOSPITAL_COMMUNITY): Payer: Self-pay

## 2024-10-12 NOTE — Telephone Encounter (Signed)
 Attempted x numerous attempts to reach patient to discuss upcoming procedure. Received recording that call cannot be completed as dialed.

## 2024-10-29 ENCOUNTER — Encounter
Admission: RE | Disposition: A | Discharge status: Home / Self Care | Payer: Self-pay | Source: Home / Self Care | Attending: Cardiology

## 2024-10-29 ENCOUNTER — Ambulatory Visit
Admission: RE | Admit: 2024-10-29 | Discharge: 2024-10-29 | Disposition: A | Discharge status: Home / Self Care | Attending: Cardiology | Admitting: Cardiology

## 2024-10-29 ENCOUNTER — Encounter: Payer: Self-pay | Admitting: Cardiology

## 2024-10-29 DIAGNOSIS — I10 Essential (primary) hypertension: Secondary | ICD-10-CM | POA: Insufficient documentation

## 2024-10-29 DIAGNOSIS — E785 Hyperlipidemia, unspecified: Secondary | ICD-10-CM | POA: Insufficient documentation

## 2024-10-29 DIAGNOSIS — E119 Type 2 diabetes mellitus without complications: Secondary | ICD-10-CM | POA: Diagnosis not present

## 2024-10-29 DIAGNOSIS — Z8673 Personal history of transient ischemic attack (TIA), and cerebral infarction without residual deficits: Secondary | ICD-10-CM | POA: Insufficient documentation

## 2024-10-29 DIAGNOSIS — Z4509 Encounter for adjustment and management of other cardiac device: Secondary | ICD-10-CM | POA: Insufficient documentation

## 2024-10-29 HISTORY — PX: LOOP RECORDER REMOVAL: EP1215

## 2024-10-29 MED ORDER — LIDOCAINE-EPINEPHRINE (PF) 1 %-1:200000 IJ SOLN
INTRAMUSCULAR | Status: DC | PRN
  Administered 2024-10-29: 10 mL

## 2024-10-29 MED ORDER — LIDOCAINE HCL 1 % IJ SOLN
INTRAMUSCULAR | Status: AC
  Filled 2024-10-29: qty 20

## 2024-10-29 MED ORDER — LIDOCAINE-EPINEPHRINE (PF) 1 %-1:200000 IJ SOLN
INTRAMUSCULAR | Status: AC
  Filled 2024-10-29: qty 30

## 2024-10-29 MED ORDER — ONDANSETRON HCL 4 MG/2ML IJ SOLN: 4.0000 mg | Freq: Four times a day (QID) | INTRAMUSCULAR | Status: DC | PRN

## 2024-10-29 MED ORDER — ACETAMINOPHEN 325 MG PO TABS: 325.0000 mg | ORAL_TABLET | ORAL | Status: DC | PRN

## 2024-10-29 NOTE — H&P (Signed)
" ° °  Electrophysiology Note:   Date:  10/29/24  ID:  Dagoberto LITTIE Sharps, DOB 07/23/1966, MRN [id number]   Primary Cardiologist: Caron Poser, MD Electrophysiologist: None       History of Present Illness:   [name] is a 59 y.o. female with h/o  PMH HLD, HTN, DM 2, and history of prior cryptogenic stroke status post loop recorder implantation (2018) who is being seen today for loop explant at the request of Dr. Poser.   Discussed the use of AI scribe software for clinical note transcription with the patient, who gave verbal consent to proceed.   History of Present Illness [name] is a 59 year old female who presents for removal of a loop recorder with a dead battery.   A loop recorder was implanted several years ago by an unknown provider, someone at [facility]. The device's battery is now dead, and she seeks its removal. The device is not painful, but she can feel it under her skin.   She experiences occasional sensations of her heart pounding, racing, or fluttering. These episodes are brief, lasting seconds. No prolonged episodes of heart pounding, racing, or fluttering. No new or acute complaints.   Interval: Patient presents today for loop explant. Reports feeling relatively well. No new or acute complaints.   Review of systems complete and found to be negative unless listed in HPI.    EP Information / Studies Reviewed:     EKG is not ordered today. EKG from 07/26/24 reviewed which showed normal sinus rhythm with RBBB.        Echo 06/28/24: Study Conclusions  - Left ventricle: The cavity size was normal. Systolic function was    normal. The estimated ejection fraction was in the range of 55%    to 60%. Wall motion was normal; there were no regional wall    motion abnormalities. Left ventricular diastolic function    parameters were normal.        Physical Exam:    Today's Vitals   10/29/24 1444  BP: 134/85  Pulse: 61  Resp: 12  Temp: 98.1 F (36.7 C)  TempSrc: Temporal   SpO2: 99%  Weight: 122.5 kg  Height: 5' 5 (1.651 m)  PainSc: 0-No pain   Body mass index is 44.93 kg/m.  General: Well developed, in no acute distress.  Neck: No JVD.  Cardiac: Normal rate, regular rhythm.  Resp: Normal work of breathing.  Ext: No edema.  Neuro: No gross focal deficits.  Psych: Normal affect.    ASSESSMENT AND PLAN:     #Cryptogenic stroke s/p loop recorder: - Patient's loop recorder has been EOL for some time. She wants it removed. Explained risks, benefits, and alternatives to ILR explant, including but not limited to bleeding, infection, damage to heart or lungs, heart attack, stroke, or death.  Pt verbalized understanding and agrees to proceed. Follow up with Dr. Kennyth for ILR explant.    Signed, Fonda Kennyth, MD      "

## 2024-10-29 NOTE — Discharge Instructions (Signed)
 After Your Loop Removal   ACTIVITY  Do not lift, push, pull, or carry anything over 10 pounds for 7 days after your procedure.    INCISION/Dressing If large square, outer bandage is left in place, this can be removed after 24 hours from your procedure. Do not remove steri-strips or glue as below.   Monitor your incision site for redness, swelling, and drainage. Call the device clinic at [phone] if you experience these symptoms or fever/chills.  If your incision is sealed with Steri-strips or staples, you may shower 3 days after your procedure or when told by your provider. Do not remove the steri-strips or let the shower hit directly on your site. You may wash around your site with soap and water .    Avoid lotions, ointments, or perfumes over your incision until it is well-healed.  You may use a hot tub or a pool AFTER 7 days.  Call the office right away if: You have chest pain. You feel more short of breath than you have felt before. You feel more light-headed than you have felt before. Your incision starts to open up.  This information is not intended to replace advice given to you by your health care provider. Make sure you discuss any questions you have with your health care provider.

## 2024-10-30 ENCOUNTER — Encounter: Payer: Self-pay | Admitting: Cardiology
